# Patient Record
Sex: Female | Born: 1960 | Race: White | Marital: Married
Health system: Midwestern US, Academic
[De-identification: ages and names within clinical notes are randomized; demographics above are authoritative.]

## PROBLEM LIST (undated history)

## (undated) DIAGNOSIS — Z9989 Dependence on other enabling machines and devices: Secondary | ICD-10-CM

## (undated) DIAGNOSIS — Z8042 Family history of malignant neoplasm of prostate: Secondary | ICD-10-CM

## (undated) DIAGNOSIS — G4733 Obstructive sleep apnea (adult) (pediatric): Secondary | ICD-10-CM

## (undated) DIAGNOSIS — Z8041 Family history of malignant neoplasm of ovary: Secondary | ICD-10-CM

## (undated) DIAGNOSIS — C50919 Malignant neoplasm of unspecified site of unspecified female breast: Secondary | ICD-10-CM

## (undated) DIAGNOSIS — Z8049 Family history of malignant neoplasm of other genital organs: Secondary | ICD-10-CM

## (undated) DIAGNOSIS — I519 Heart disease, unspecified: Secondary | ICD-10-CM

## (undated) DIAGNOSIS — E785 Hyperlipidemia, unspecified: Secondary | ICD-10-CM

## (undated) DIAGNOSIS — E039 Hypothyroidism, unspecified: Secondary | ICD-10-CM

## (undated) DIAGNOSIS — I428 Other cardiomyopathies: Secondary | ICD-10-CM

## (undated) DIAGNOSIS — E119 Type 2 diabetes mellitus without complications: Secondary | ICD-10-CM

## (undated) DIAGNOSIS — Z800 Family history of malignant neoplasm of digestive organs: Secondary | ICD-10-CM

## (undated) DIAGNOSIS — T451X5A Adverse effect of antineoplastic and immunosuppressive drugs, initial encounter: Secondary | ICD-10-CM

## (undated) DIAGNOSIS — G620 Drug-induced polyneuropathy: Secondary | ICD-10-CM

## (undated) HISTORY — DX: Family history of malignant neoplasm of prostate: Z80.42

## (undated) HISTORY — DX: Heart disease, unspecified: I51.9

## (undated) HISTORY — DX: Family history of malignant neoplasm of ovary: Z80.41

## (undated) HISTORY — DX: Hyperlipidemia, unspecified: E78.5

## (undated) HISTORY — DX: Hypothyroidism, unspecified: E03.9

## (undated) HISTORY — DX: Obstructive sleep apnea (adult) (pediatric): Z99.89

## (undated) HISTORY — DX: Family history of malignant neoplasm of other genital organs: Z80.49

## (undated) HISTORY — DX: Obstructive sleep apnea (adult) (pediatric): G47.33

## (undated) HISTORY — DX: Other cardiomyopathies: I42.8

## (undated) HISTORY — DX: Type 2 diabetes mellitus without complications: E11.9

## (undated) HISTORY — DX: Morbid (severe) obesity due to excess calories: E66.01

## (undated) HISTORY — DX: Family history of malignant neoplasm of digestive organs: Z80.0

---
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
## 1979-01-14 NOTE — Unmapped (Signed)
Signed by Hughie Closs MD on 01/14/1979 at 00:00:00  Surgery Breast      Imported By: Deon Pilling 03/31/2007 10:36:31    _____________________________________________________________________    External Attachment:    Please see [facility] EMR for this document.

## 2005-05-08 HISTORY — PX: OOPHORECTOMY: SHX86

## 2005-06-19 NOTE — Unmapped (Signed)
Signed by Hughie Closs MD on 06/19/2005 at 00:00:00  Oncology      Imported By: Ileene Hutchinson 07/16/2006 15:46:34    _____________________________________________________________________    External Attachment:    Please see [facility] EMR for this document.

## 2005-10-08 DIAGNOSIS — Z923 Personal history of irradiation: Secondary | ICD-10-CM

## 2005-10-08 DIAGNOSIS — C50919 Malignant neoplasm of unspecified site of unspecified female breast: Secondary | ICD-10-CM

## 2005-10-08 HISTORY — DX: Malignant neoplasm of unspecified site of unspecified female breast: C50.919

## 2005-10-08 HISTORY — PX: BREAST LUMPECTOMY: SHX2

## 2005-10-08 HISTORY — DX: Personal history of irradiation: Z92.3

## 2005-10-08 HISTORY — PX: BREAST BIOPSY: SHX20

## 2006-05-10 NOTE — Unmapped (Signed)
Signed by Hughie Closs MD on 05/10/2006 at 00:00:00  Disclosure      Imported By: Elsworth Soho 05/21/2006 14:45:49    _____________________________________________________________________    External Attachment:    Please see [facility] EMR for this document.

## 2006-05-10 NOTE — Unmapped (Signed)
Signed by Hughie Closs MD on 05/10/2006 at 10:46:29      Reason for Visit   Chief Complaint: pre op,8-6-7,Dr.Rob Bradley,Lt breast lump removal,[facility].    History from: patient    Allergies  No Known Allergies    Medications     Vital Signs:   Ht: 65 in.  Wt: 223 lbs.      BMI: 37.24  BSA: 2.07    Temperature: 98.7  Pulse: 80  Resp: 12  BP: 126/90    Intake recorded by: Lanney Gins MA on May 10, 2006 10:02 AM  I have been asked to perform a pre-operative/pre-procedure consult of this patient by: rob bradley  Procedure: breast lump removal  Date of procedure: 05/13/2006      History of Present Illness: feels stressed -- not depressed -- is anxious about the surgery  can sleep  doesn't feel need for nerve pill  wants another opinion before having the lumpectomy -- can see Dr. Hassan Buckler on 05-16-06 (she has investigated this herself already)  no weight loss  some discomfort in the breast, dull  no skin changes noted    Past History  Past Medical History:  Hepatitis A  Surgical History:  2005 cystoscopy, Salpingo-Oophorectomy: 2006, 1990 laparoscopy  multiple d and c's, No problems with anesthesia    Family History: Father - htn  Social History: Marital Status: married,   Employment Status: employed full-time,   Occupation: Runner, broadcasting/film/video  Patient Lives at: home,   Support System: excellent  Caffeine per Day: <1  Seatbelt Use: 100 % of the time  Alcohol Use: none  Drug Use: none  Tobacco Usage:non-smoker        Physical Examination:     Additional Vitals:   BP: 126/  90    Physical Exam- Detail:   General Appearance: well-developed, well-nourished and in no acute distress.  Skin: Normal. No rashes or lesions.  Eyes: Sclera white, conjunctiva without injection and pallor.  PERRLA.  EOMI  Ears: No lesions.  Tympanic membranes translucent, non-bulging.  Canal walls pink, without discharge.  Hearing grossly intact.  Nose: Mucosa and turbinates pink, septum midline. No polyps, no discharge, no lesions.  Oropharynx:  Normal appearance.  No erythema,exudate, or mass. No tonsillar swelling.  Oral Cavity: Gums pink, good dentition.  Oral mucosa and tongue without lesions.  Respiratory: Respiration un-labored.  Lung fields clear to auscultation.  No wheezing, rales, rhonchi or pleural rub.  Neck: Full ROM.  No thyromegaly, no nodules, masses, or tenderness.   Lymphatic: Areas palpated not enlarged:  cervical, supraclavicular.  Cardiac: S1 and S2 normal.  RRR without murmurs, rubs, gallops.  No JVD.  Vascular: No carotid bruits.  No edema or varicosities.  Abdomen: No masses or tenderness. Bowel sounds active x4 quad.  Liver and spleen are without tenderness or enlargement.  No hernias.  Neurologic: Cranial nerves 2 through 12 intact.  Deep tendon reflexes 2+ bilaterally.  Sensation intact.  No spasticity.  Strength is 5/5 in upper and lower extremities bilaterally.  Psychiatric: slightly anxious but ow normal  Musculoskeletal: Gait coordinated and smooth.  Digits are without clubbing or cyanosis.        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  Runk per pt wishes - Hughie Closs MD  Assessed BREAST MASS as unchanged - not examined again today but discussed at lenth -- it does have to be addressed -- pt agrees - Hughie Closs MD  New Problems:  Dx of ANXIETY DISORDER, SITUATIONAL, MILD (ICD-309.24)   Dx of BREAST MASS (ICD-611.72)     Today's Orders   99214 - Ofc Vst, Est Level IV [[id number]]  Surgical Consult [[id number]]    Disposition:   prn                       ]

## 2006-05-10 NOTE — Unmapped (Signed)
Signed by Hughie Closs MD on 05/10/2006 at 00:00:00  Privacy Notice      Imported By: Elsworth Soho 05/21/2006 14:45:04    _____________________________________________________________________    External Attachment:    Please see [facility] EMR for this document.

## 2006-05-16 NOTE — Unmapped (Signed)
Signed by Morton Stall MD on 05/16/2006 at 00:00:00  Mammography      Imported By: Ileene Hutchinson 06/21/2006 13:50:34    _____________________________________________________________________    External Attachment:    Please see [facility] EMR for this document.

## 2006-05-23 NOTE — Unmapped (Signed)
Signed by Morton Stall MD on 05/23/2006 at 00:00:00  MRI Breast      Imported By: Ileene Hutchinson 07/01/2006 09:49:03    _____________________________________________________________________    External Attachment:    Please see [facility] EMR for this document.

## 2006-05-28 NOTE — Unmapped (Signed)
Signed by   [employer] on 05/28/2006 at 14:21:01  Patient: Ellen Berger  Note: All result statuses are Final unless otherwise noted.    Tests: (1) DIAG-CHEST PA & LATERAL [phone])    Order NotePricilla Handler Order Number: [id number]    Non-EMR Ordering Provider: Ardis Hughs     Order Note:     *** VERIFIED ***  [facility]  Reason:  left breast ca- stat wet reading please  Dict.Staff: Canary Brim [phone]    Verified By: Canary Brim        Ver: 05/28/06   2:21 pm  Exams:  DIAG-CHEST PA & LATERAL      CHEST (PA and lateral)        The heart is borderline/mildly enlarged.. Central vessels are  unremarkable. Lungs are clear as are the angles.  Surrounding  soft tissue and osseous structures are normal.    IMPRESSION:    No active chest disease.    Borderline heart size.  **** end of result ****    Note: An exclamation mark (!) indicates a result that was not dispersed into   the flowsheet.  Document Creation Date: 05/28/2006 2:21 PM  _______________________________________________________________________    (1) Order result status: Final  Collection or observation date-time: 05/28/2006 14:02:13  Requested date-time: 05/28/2006 13:42:00  Receipt date-time:   Reported date-time: 05/28/2006 14:21:00  Referring Physician: Chinita Pester  Ordering Physician:  Non-EMR Physician [facility])  Specimen Source:   Source: QRS  Filler Order Number: [id number]  Lab site: [facility]

## 2006-05-29 NOTE — Unmapped (Signed)
Signed by Hughie Closs MD on 05/29/2006 at 00:00:00  office notes from [facility]      Imported By: Maia Petties 08/21/2006 14:29:15    _____________________________________________________________________    External Attachment:    Please see [facility] EMR for this document.

## 2006-05-29 NOTE — Unmapped (Signed)
Signed by   [employer] on 06/06/2006 at 04:54:14  Patient: Ellen Berger  Note: All result statuses are Final unless otherwise noted.    Tests: (1)  (MR)    Order Note:                                        [facility]     PATIENT NAMEVENNELA, JUTTE                   MR #:  [id number]  DATE OF BIRTH:  10-08-1961                         ACCOUNT #:  [id number]  SURGEON:        Cherly Anderson. Hassan Buckler, M.D.               ROOM #:  SDS  SERVICE:        General Surgery                    NURSING UNIT:  [facility]  PRIMARY:        Morton Stall, M.D.              FCSalena Saner  REFERRING:      Caroline More, M.D.          ADMIT DATE:  05/29/2006  DICTATED BY:    Cherly Anderson. Hassan Buckler, M.D.               SURGERY DATE:  05/29/2006                                                     DISCHARGE DATE:                                    OPERATIVE REPORT     PREOPERATIVE DIAGNOSES:     1. Left breast mass.  2. Abnormal left mammogram.  3. Atypia on fine needle aspiration.     POSTOPERATIVE DIAGNOSIS:     1. Left breast cancer.     PROCEDURES PERFORMED:     1. Left lumpectomy.  2. Left axillary lymph node dissection with sentinel node biopsy.     SURGEON:  Jac Canavan, M.D.     ASSISTANT:  Fredric Dine, D.O.     ESTIMATED BLOOD LOSS:  Less than 50 mL.     ANESTHESIA:  General plus 30 mL of 1% Xylocaine.     SPECIMENS:     1. Left breast mass, frozen section infiltrating ductal carcinoma.  2. Superior margin.  3. Inferior margin.  4. Medial margin.  5. Posterolateral margin.  6. Sentinel lymph node number 1.  7. Sentinel lymph node number 2.  8. Left axillary contents.     TUBES AND DRAINS:  A 10-mm Blake drain x 1.     INDICATIONS FOR PROCEDURE:  The patient is a 45 year old female who noted a  mass in the left breast several months ago.  This mass has been increasing in  size.  She underwent a mammogram, which showed a questionable area  of  architectural distortion.  She was recently seen in the office and the mass  was palpated and felt to  be suspicious for carcinoma.  Ultrasound showed a 2  cm solid mass with posterior shadowing.  Fine needle aspiration showed  atypical cells suspicious for carcinoma.  We have discussed breast ________  and she will proceed with lumpectomy.  If this appears to be positive for  carcinoma, at the time of her frozen section we will also proceed with  axillary node dissection.     DETAILS OF PROCEDURE:  The patient was brought to the operating room and  placed on the OR table in the supine position.  Following the induction of  general anesthesia, 3 mL of methylene blue were injected at the 1 o'clock  position of the left breast along the areolar border.  The patient's left  breast was prepped with Betadine and draped in the usual sterile manner.  An  elliptical incision was made overlying the mass at the 2 o'clock position of  the left breast.  Dissection was carried down until the mass was identified.  The mass was grasped with Allis clamps and the lesion was removed.  The  specimen was marked with a short suture on the superior margin and a long  suture on the lateral margin.  The specimen was submitted to pathology and  frozen section revealed an infiltrating ductal carcinoma.  Additional tissue  was taken along the superior, inferior, medial and posterolateral margins.  The anterior margin was fully excised at the time of the initial biopsy.  Of  note, the patient had undergone a MRI of the breast prior to surgery and  there was only noted to be one lesion within the breast.  Xylocaine was  injected into the site.  Hemoclips were left on the margins of the lumpectomy  cavity.  The subcutaneous tissue was closed with interrupted 3-0 Vicryl  suture and the skin was closed with 4-0 Vicryl subcuticular stitch.     A curvilinear incision was made in the axilla and dissection was carried  through the subcutaneous tissue until the clavipectoral fascia was  identified.  Upon opening of the clavipectoral fascia, a small blue  stain  lymph node was noted.  This was submitted as sentinel lymph node number 1.  Further dissection did in fact reveal a 1.5 cm firm blue stained lymph node,  which was highly suspicious for metastatic disease.  For this reason, further  axillary nodes were removed from the level 1 region taking fat between the  axillary vein, the chest wall and the latissimus dorsi muscle.  Vascular and  lymphatic channels were clipped as they were encountered.  The long thoracic  and thoracodorsal nerves were identified and preserved.  A 10-mm Blake drain  was placed through a separate stab incision in the anterior axillary line and  anchored into place with a 2-0 silk suture.  The subcutaneous tissue was  closed with interrupted 3-0 Vicryl suture and the skin closed with 4-0 Vicryl  subcuticular stitch.  Benzoin, Steri-Strips, dry sterile gauze and Tegaderm  dressing were applied.  All needle and sponge counts were correct.     The patient was returned to recovery in good condition. I was present for the  entire procedure.  ________________________________________  DMR/km                                ____  D:  06/05/2006 20:54                  Dianne M. Hassan Buckler, M.D.  T:  06/06/2006 04:50  Job #:  [id number]  c:   Fredric Dine, D.O.                                    OPERATIVE REPORT                                        COPY                   PAGE    1 of 1    Note: An exclamation mark (!) indicates a result that was not dispersed into   the flowsheet.  Document Creation Date: 06/06/2006 4:54 AM  _______________________________________________________________________    (1) Order result status: Final  Collection or observation date-time: 05/29/2006 00:00  Requested date-time:   Receipt date-time:   Reported date-time:   Referring Physician: Jerrel Ivory  Ordering Physician:  Reviewed In Hospital [facility])  Specimen Source:   Source: DBS  Filler Order Number: [id number]  ASC  Lab site:

## 2006-07-04 NOTE — Unmapped (Signed)
Signed by Hughie Closs MD on 07/04/2006 at 00:00:00  Dr. Elesa Massed      Imported By: Ileene Hutchinson 07/19/2006 16:10:26    _____________________________________________________________________    External Attachment:    Please see [facility] EMR for this document.

## 2006-10-15 NOTE — Unmapped (Signed)
Signed by Hughie Closs MD on 10/15/2006 at 00:00:00  New Patient Consult---Dr. Delford Field      Imported By: Uvaldo Bristle 12/24/2006 09:23:35    _____________________________________________________________________    External Attachment:    Please see [facility] EMR for this document.

## 2006-10-30 NOTE — Unmapped (Signed)
Signed by Pandora Leiter MD on 10/30/2006 at 00:00:00  [facility]      Imported By: Burgess Estelle 12/19/2006 10:29:43    _____________________________________________________________________    External Attachment:    Please see [facility] EMR for this document.

## 2006-11-26 NOTE — Unmapped (Signed)
Signed by Hughie Closs MD on 11/26/2006 at 00:00:00  Oncology      Imported By: Deon Pilling 01/13/2007 08:39:35    _____________________________________________________________________    External Attachment:    Please see [facility] EMR for this document.

## 2006-12-10 NOTE — Unmapped (Signed)
Signed by Hughie Closs MD on 12/10/2006 at 00:00:00  Weekly radiation note      Imported By: Sena Hitch 04/16/2007 11:20:41    _____________________________________________________________________    External Attachment:    Please see [facility] EMR for this document.

## 2006-12-24 NOTE — Unmapped (Signed)
Signed by Hughie Closs MD on 12/24/2006 at 00:00:00  Radiation Summary      Imported By: Sena Hitch 01/22/2007 11:18:41    _____________________________________________________________________    External Attachment:    Please see [facility] EMR for this document.

## 2007-01-21 NOTE — Unmapped (Signed)
Signed by Hughie Closs MD on 01/21/2007 at 00:00:00  Radiation follow-up note      Imported By: Sena Hitch 04/16/2007 11:20:16    _____________________________________________________________________    External Attachment:    Please see [facility] EMR for this document.

## 2008-12-03 NOTE — Unmapped (Signed)
Signed by Verdie Shire MD on 12/03/2008 at 20:19:54      Reason for Visit   Chief Complaint: cough, has had for 3 weeks.    History from: patient    Allergies  No Known Allergies    Medications   TAMOXIFEN CITRATE 20 MG TABS (TAMOXIFEN CITRATE) 1 by mouth daily       Vital Signs:   Ht: 65 in.  Wt: 230 lbs.      BMI: 38.41  BSA: 2.10  Wt chg (lbs): 7  Temperature: 98.5  degrees F  oral  Pulse: 92 (regular)  BP: 126/84  Cuff size: large    Intake recorded by: Paschal Dopp MA on December 03, 2008 3:30 PM    History of Present Illness   Ellen Berger went to urgent care 2 weeks ago.     Ellen Berger was given Biaxin.     Ellen Berger had a chest Xray showing clear lungs and mild cardiomegaly.  yesterday Ellen Berger could not stop coughing.      Ellen Berger does not feel completely well.   Ellen Berger is sometimes short of breath.    Ellen Berger feels her sinuses are congested.    Past History  Past Medical History:  Hepatitis A, Primary Breast cancer  Surgical History:  2005 cystoscopy, Salpingo-Oophorectomy: 2006 - dermoid cyst, 1990 laparoscopy  multiple d and c's, No problems with anesthesia    Family History: Father - htn  PGM - uterine cancer  Social History (reviewed - no changes required): Marital Status: married,   Employment Status: employed full-time,   Occupation: Runner, broadcasting/film/video  Patient Lives at: home,   Support System: excellent  Caffeine per Day: <1  Seatbelt Use: 100 % of the time  Alcohol Use: none  Drug Use: none  Tobacco Usage:non-smoker        Physical Examination:   BP: 126/  84    Physical Exam- Detail:   General Appearance: well-developed, well-nourished and in no acute distress.  Eyes: Sclera white, conjunctiva without injection and pallor.  PERRLA.  EOMI  Ears: No lesions.  Tympanic membranes translucent, non-bulging.  Canal walls pink, without discharge.  Hearing grossly intact.  Nose/Face: Mucosa and turbinates pink, septum midline. No polyps, no discharge, no lesions.  Oropharynx: Normal appearance.  No erythema, exudate or mass. No tonsillar  swelling.  Oral Cavity: Gums pink, good dentition.  Oral mucosa and tongue without lesions.  Respiratory: Respiration un-labored.  Lung fields clear to auscultation.  No wheezing, rales, rhonchi or pleural rub.  Neck: No thyromegaly.  No nodules, masses or tenderness.  Lymphatic: Areas palpated not enlarged:  cervical, supraclavicular.  Cardiac: S1 and S2 normal.  RRR without murmurs, rubs, gallops.  No JVD.           New Problems:  ACUTE BRONCHITIS (ICD-466.0)  New Medications:  TAMOXIFEN CITRATE 20 MG TABS (TAMOXIFEN CITRATE) 1 by mouth daily  GUAIFENESIN AC 100-10 MG/5ML SYRP (GUAIFENESIN-CODEINE) 5 - 10 cc every 4 hours as needed      Preventive Maintenance             Prescriptions:  GUAIFENESIN AC 100-10 MG/5ML SYRP (GUAIFENESIN-CODEINE) 5 - 10 cc every 4 hours as needed  #240 cc x 0   Entered and Authorized by: Verdie Shire MD   Signed by: Verdie Shire MD on 12/03/2008   Method used: Print then Give to Patient   RxID: [id number]      Assessment and Plan   Ellen Berger will ask Dr. Elesa Massed  about the cardilomegaly.   I will give her an order for a follow up chest Xray.    Problems   Status of Existing Problems:  Assessed ACUTE BRONCHITIS as improved - Ellen Berger is better but not well.    I will give her cough syrup.     Ellen Berger will let me know if her cough becomes more productive. Verdie Shire MD - Signed  New Problems:  Dx of ACUTE BRONCHITIS (ICD-466.0)  Onset: 12/03/2008    Medications   New Prescriptions/Refills:  GUAIFENESIN AC 100-10 MG/5ML SYRP (GUAIFENESIN-CODEINE) 5 - 10 cc every 4 hours as needed  #240 cc x 0, 12/03/2008, Verdie Shire MD    Today's Orders   [phone] - Ofc Vst, Est Level III [CPT-99213]  X-ray, Chest, PA & Lateral [CPT-71020]

## 2008-12-08 NOTE — Unmapped (Signed)
Signed by Verdie Shire MD on 12/08/2008 at 00:00:00  Cardiac       Imported By: Doyle Askew 01/19/2009 11:32:32    _____________________________________________________________________    External Attachment:    Please see [facility] EMR for this document.

## 2009-01-01 NOTE — Unmapped (Signed)
Signed by Verdie Shire MD on 01/01/2009 at 18:30:41    PHONE NOTE    Reason for Call: acute illness. she has had vomiting and diarrhea since yesterday.  she was put on new medication for a cardiomyopathy but was told to call me about the vomiting.    she is taking coreg, Lasix, and potassium.  I recommended she go to the ER at [facility] where her cardiologist works to have her lab work checked.           Initial call taken by: Verdie Shire MD,  January 01, 2009 6:29 PM

## 2009-01-01 NOTE — Unmapped (Signed)
Signed by   LinkLogic on 01/01/2009 at 21:39:45  Patient: Ellen Berger  Note: All result statuses are Final unless otherwise noted.    Tests: (1)  (Emergency Room Report)    Order Note:     ADDENDUM:    Her CT of the abdomen was consistent with right pyelonephritis.  I discussed   the findings  with the patient and I will also discuss them with Dr. Yvonna Alanis group for   probable  admission.          Order Note: Site: [facility]  [phone]  Admission Date/Time: 01/01/09   Discharge Date/Time: //   Unit #: [id number]  Location: BN6T2  Account #: [id number]  Primary Insurance: [employer]  Admitting Diagnosis: ABDOMINAL PAIN, NAUSEA, VOMITING  Report #: [phone]      Order Note:     Order Note: _________________________________  Signed by:    Tarri Glenn MD      PG  D:  01/01/09 1738  T:  01/01/09 1921    This document is confidential medical information.  Unauthorized disclosure or   use of this information is prohibited by law.  If you are not the intended   recipient of this document, please advise [place] by calling immediately   [phone].    Note: An exclamation mark (!) indicates a result that was not dispersed into   the flowsheet.  Document Creation Date: 01/01/2009 9:39 PM  _______________________________________________________________________    (1) Order result status: Final  Collection or observation date-time: 01/01/2009 17:38  Requested date-time: 01/01/2009 19:21  Receipt date-time:   Reported date-time:   Referring Physician:    Ordering Physician:  Reviewed In Hospital [facility])  Specimen Source:   Source: Francesco Sor Order Number: [id number] TRANSCRIPTION  Lab site: [place] [facility]

## 2009-01-01 NOTE — Unmapped (Signed)
Signed by   LinkLogic on 01/04/2009 at 08:54:57  Patient: Ellen Berger  Note: All result statuses are Final unless otherwise noted.    Tests: (1)  (Emergency Room Report)    Order Note:       CHIEF COMPLAINT:  Nausea and vomiting.    HISTORY OF PRESENT ILLNESS:  The patient is a 48 year old female, who reports   that she has had nausea and vomiting since 2 o'clock yesterday.  She reports   her vomitus has been yellowish and greenish, discolored occasionally.  Denies   any blood in the vomitus.  Reports that she has some occasional left upper   abdominal discomfort, worse with vomiting.  Describes it as sharp crampy in   nature.  Does report there is some radiation of the discomfort to her back.    In any case, she had 1 episode of loose stool yesterday, but she reports she   has had no overt diarrhea.  Denies noticing any blood in the vomitus or the   stool.  Denies any fever at home.  Reports she is not aware of any   exacerbating or alleviating factors of the nausea or vomiting.   ALLERGIES:  None.    CURRENT MEDICATIONS:  1.  Tamoxifen.  2.  Potassium chloride.  3.  Lasix.  4.  _____.    PAST MEDICAL HISTORY:  Breast cancer as well as recent diagnosis of CHF.    PAST SURGICAL HISTORY:  The patient recently had a heart catheterization.    SOCIAL HISTORY:  No alcohol.  No tobacco.  No illicit drug use.  The patient   lives independently at home with her husband.    REVIEW OF SYSTEMS:  Significant for nausea, vomiting, some upper abdominal   discomfort since yesterday.  The patient denies any fevers at home.  Reports   she has no chest pain or shortness of breath.  Indicates she has no headache   or dizziness.  Did have one episode of diarrhea yesterday but indicates the   diarrhea has resoled.  Reports she has seen no blood in the vomitus or the   stool. Indicates she has no lower back discomfort or dysuria.  Remainder of   review of systems is otherwise negative.    PHYSICAL EXAMINATION:  GENERAL:  Pleasant  48 year old female, in no acute distress upon evaluation.  VITAL SIGNS:  Blood pressure 113/88, pulse 118, respiratory rate 20, O2   saturation 97%, and temperature 99.0.  HEENT:  Head is normocephalic and atraumatic.  Pupils are equal, round, and   reactive to light and accommodation.  Extraocular muscles are intact.  Sclera   nonicteric.  Nares are patent bilaterally.  Oral mucous membranes are pink and   moist.  No lesions.  NECK:  Supple.  Trachea is midline.  LUNGS:  Clear bilaterally.  No adventitious sounds.  CARDIOVASCULAR:  Slightly tachycardic with no rubs or gallops appreciated.  ABDOMEN:  Soft.  There is mild epigastric tenderness to palpation with no   guarding, no rebound.  Good bowel sounds in all 4 quadrants.  No masses are   noted. BACK:  Shows no CVA tenderness to palpation.  PELVIC:  Stable.  EXTREMITIES:  She has full range of motion in both upper and lower extremities   and 5/5 strength.  Good distal pulses, good distal sensation in all 4   extremities.  No signs of edema or tenderness to palpation lower extremities   bilaterally.  SKIN:  Exam shows no rashes, lesions, or petechiae.  NEUROLOGICAL:  Cranial nerves II through XII grossly intact.    LABS AND DATA:  CBC reveals white count of 17, H&H 15 and 46, and platelet   count 348,000.  Lipase 21, amylase 38.  LFTs seen and evaluated by myself, all   within normal limits except the AST elevated at 100 and the ALT at 82.    Metabolic panel showed sodium 135, potassium 3.7, chloride 98, CO2 27, BUN and   creatinine 14 and 0.76, glucose 139.  Urinalysis shows trace leukocyte   esterase, negative nitrites.  There are 13 wbc's with no obvious bacteria   noted.  Acute abdominal series showed nonspecific bowel gas pattern with no   obvious air-fluid levels, no free air appreciated under the diaphragm.    MEDICAL DECISION MAKING:  A 48 year old female with history of breast cancer   presents with nausea and vomiting for a day.  Abdomen is soft here in  the   emergency department.  She has no significant right upper quadrant tenderness   to palpation or right lower quadrant tenderness to palpation.  I am not   concerned at present for cholecystitis or appendicitis.  She does have some   upper epigastric and left-sided pain, but she has normal lipase, and,   therefore, the pancreatitis is not a concern.  Given the fact that her white   count was elevated the CT scan has been ordered.  The patient has currently   finished her contrast and is pending CT of the abdomen and pelvis to rule out   any other intraabdominal etiology contributing to her symptoms.  She is in   stable condition at present.  The vomiting has been controlled with 1 dose of   Zofran.  Further dictation to provide ultimate disposition and CT scan   results.                  Order Note: Site: [facility]  [phone]  Admission Date/Time: 01/01/09   Discharge Date/Time: 01/02/09   Unit #: [id number]  Location: BN6T2  Account #: [id number]  Primary Insurance: [employer]  Admitting Diagnosis: ABDOMINAL PAIN, NAUSEA, VOMITING  Report #: [id number]      Order Note:     Order Note: _________________________________  Signed by:    Chaney Born MD      RN  D:  01/01/09   T:  01/04/09 0834    This document is confidential medical information.  Unauthorized disclosure or   use of this information is prohibited by law.  If you are not the intended   recipient of this document, please advise [place] by calling immediately   [phone].    Note: An exclamation mark (!) indicates a result that was not dispersed into   the flowsheet.  Document Creation Date: 01/04/2009 8:54 AM  _______________________________________________________________________    (1) Order result status: Final  Collection or observation date-time: 01/01/2009  Requested date-time: 01/04/2009 08:34  Receipt date-time:   Reported date-time:   Referring Physician:    Ordering Physician:  Reviewed  In Hospital [facility])  Specimen Source:   Source: Francesco Sor Order Number: [id number] TRANSCRIPTION  Lab site: [facility]

## 2009-01-02 NOTE — Unmapped (Signed)
Signed by   LinkLogic on 01/02/2009 at 13:17:01  Patient: Ellen Berger  Note: All result statuses are Final unless otherwise noted.    Tests: (1)  (D/C Medication Reconciliation)    Order Note:     NOLVADEX                           Take 20 MG  (TAMOXIFEN CITRATE)               EVERY DAY By Mouth    KLOR-CON                           Take 20 MEQ  (POTASSIUM CHLORIDE)              EVERY DAY By Mouth    LASIX                              Take 40 MG  (FUROSEMIDE)                      TWO TIMES A DAY By Mouth    COREG                              Take 3.125 MG  (CARVEDILOL)                      TWO TIMES A DAY By Mouth    OS-CAL-D 500                       Take 2 EACH  (CALC CARB 1250/VIT D 200 U)      EVERY DAY By Mouth    NOLVADEX                           Take 20 MG  (TAMOXIFEN CITRATE)               2100 By Mouth      New Orders:  LEVAQUIN 500MG  DAILY FOR 7 DAYS    A copy of this report was provided to patient: [name]            Order Note: Site: [facility]  [phone]  Admission Date/Time: 01/01/09   Discharge Date/Time: //   Unit #: [id number]  Location: BN6T2  Account #: [id number]  Primary Insurance: [employer]  Admitting Diagnosis: ABDOMINAL PAIN, NAUSEA, VOMITING  Report #: [id number]      Order Note:     Order Note: _________________________________  Signed by:            DC  D:  01/02/09 1222  T:  01/02/09 1222    This document is confidential medical information.  Unauthorized disclosure or   use of this information is prohibited by law.  If you are not the intended   recipient of this document, please advise [place] by calling immediately   [phone].    Note: An exclamation mark (!) indicates a result that was not dispersed into   the flowsheet.  Document Creation Date: 01/02/2009 1:17 PM  _______________________________________________________________________    (1) Order result status: Final  Collection or observation date-time: 01/02/2009  12:22  Requested date-time: 01/02/2009 12:22  Receipt date-time:  Reported date-time:   Referring Physician:    Ordering Physician:  Reviewed In Hospital [facility])  Specimen Source:   Source: Francesco Sor Order Number: [id number] TRANSCRIPTION  Lab site: [facility]

## 2009-01-02 NOTE — Unmapped (Signed)
Signed by   LinkLogic on 01/03/2009 at 04:07:11  Patient: Barabara Wonnacott  Note: All result statuses are Final unless otherwise noted.    Tests: (1)  (DS)    Order Note:   For your records, the bill status(es) for [facility] during their   hospital stay is as follows:    01/01/09 1222  ENTER REG ER  01/01/09 1831  ENTER ADM IN  01/01/09 1831  CHANGE IN TO OBSERV  01/02/09 1619  DISCHARGE OBSERV    Any questions should be directed to the Care Management Departments:  [facility] [phone] or [facility] [phone].          Order Note: Site: [facility]  [phone]  Admission Date/Time: 01/01/09   Discharge Date/Time: 01/02/09   Unit #: [id number]  Location: BN6T2  Account #: [id number]  Primary Insurance: [employer]  Admitting Diagnosis: ABDOMINAL PAIN, NAUSEA, VOMITING  Report #: [id number]      Order Note:     Order Note: _________________________________  Signed byBonnetta Barry  D:  01/02/09 1619  T:  01/02/09 1619    This document is confidential medical information.  Unauthorized disclosure or   use of this information is prohibited by law.  If you are not the intended   recipient of this document, please advise [place] by calling immediately   [phone].    Note: An exclamation mark (!) indicates a result that was not dispersed into   the flowsheet.  Document Creation Date: 01/03/2009 4:07 AM  _______________________________________________________________________    (1) Order result status: Final  Collection or observation date-time: 01/02/2009 16:19  Requested date-time: 01/02/2009 16:19  Receipt date-time:   Reported date-time:   Referring Physician:    Ordering Physician:  Reviewed In Hospital [facility])  Specimen Source:   Source: Francesco Sor Order Number: [id number] TRANSCRIPTION  Lab site: [facility]

## 2009-01-04 LAB — OFFICE VISIT LAB RESULTS
Glucose, UA: NEGATIVE
Ketones, UA: NEGATIVE
Leukocyte Esterase, UA: NEGATIVE
Nitrite, UA: NEGATIVE
Specific Gravity, UA: 1.005
Urobilinogen, UA: 2
pH, UA: 6

## 2009-01-04 NOTE — Unmapped (Signed)
Signed by Verdie Shire MD on 01/04/2009 at 14:34:38      Reason for Visit   Chief Complaint: follow up on kidney   infection  follow up from hospital    History from: patient    Allergies  No Known Allergies    Medications   TAMOXIFEN CITRATE 20 MG TABS (TAMOXIFEN CITRATE) 1 by mouth daily  GUAIFENESIN AC 100-10 MG/5ML SYRP (GUAIFENESIN-CODEINE) 5 - 10 cc every 4 hours as needed  LEVAQUIN 500-5 MG/100ML-% SOLN (LEVOFLOXACIN IN D5W) one tablet once   daily  COREG 3.125 MG TABS (CARVEDILOL) one tablet twice  a  day  FUROSEMIDE 40 MG TABS (FUROSEMIDE) one tablet twice  aday  KLOR-CON M20 20 MEQ CR-TABS (POTASSIUM CHLORIDE CRYS CR) one tablet twice aday       Vital Signs:   Wt: 206 lbs.      BMI: 34.40  BSA: 2.00  Wt chg (lbs): -24  Pulse: 82 (regular)  BP: 110/82  Cuff size: large    Intake recorded by: Tora Kindred MA on January 04, 2009 11:04 AM    History of Present Illness   she was diagnosed with a cardiomyopathy since I saw her last.    her oncologist referred her to a cardiologist after being told about the chest Xray.  she saw Dr. Thayer Jew who did a heart cath last Thursday after getting an echo.     he wanted to rule out CAD and valvular disease.  he put her on coreg and furosemide.     she developed nausea and vomiting and went to the ER on Saturday.    she was diagnosed with pyelonephritis.     she feels better with Levaquin.     she stopped working out in November.    she was tired.               Physical Examination:   BP: 110/  82    Physical Exam- Detail:   General Appearance: well-developed, well-nourished and in no acute distress.  Respiratory: Respiration un-labored.  Lung fields clear to auscultation.  No wheezing, rales, rhonchi or pleural rub.  Cardiac: S1 and S2 normal.  RRR without murmurs, rubs, gallops.  No JVD.  Vascular: No carotid bruits.  No edema or varicosities.  Abdomen: No masses or tenderness. Bowel sounds active x4 quad.  Liver and spleen are without tenderness or enlargement.  No  hernias.      In office Procedures & Tests   Date/Time Collected: 01-04-2009    Routine Urinalysis     Physical characteristics   Color: yellow  Appearance: clear    Chemical measurements   Glucose (mg/dL): negative  Bilirubin: +  Ketone (mg/dL): negative  Spec. Gravity: 1.005  Blood: +++/large  pH: 6  Protein (mg/dL): trace  Urobilinogen (mg/dL): 2  Nitrite: negative  Leukocytes: negative         New Problems:  UTI (ICD-599.0)  CARDIOMYOPATHY (ICD-425.4)  New Medications:  LEVAQUIN 500-5 MG/100ML-% SOLN (LEVOFLOXACIN IN D5W) one tablet once   daily  COREG 3.125 MG TABS (CARVEDILOL) one tablet twice  a  day  FUROSEMIDE 40 MG TABS (FUROSEMIDE) one tablet twice  aday  KLOR-CON M20 20 MEQ CR-TABS (POTASSIUM CHLORIDE CRYS CR) one tablet twice aday      Preventive Maintenance               Assessment and Plan   I will see her next week to recheck her urine.  it is not clear if her heart disease is from the chemotherapy.    Dr. Thayer Jew told her she had equal chances of getting better, worse and staying the same.    Problems   Status of Existing Problems:  Assessed CARDIOMYOPATHY as new - she is newly on medication. - Verdie Shire MD - Signed  Assessed UTI as new - she will finish levaquin.     she still has blood in her urine. - Verdie Shire MD - Signed  New Problems:  Dx of UTI (ICD-599.0)  Onset: 01/01/2009  Dx of CARDIOMYOPATHY (ICD-425.4)  Onset: 01/04/2009    Medications   Today's Orders   99213 - Ofc Vst, Est Level III [UXL-24401]  UA Dipstick (Office) [CPT-81002]    Disposition:   Return to clinic for Doctor Visit in 1 week(s)

## 2009-01-12 LAB — OFFICE VISIT LAB RESULTS
Bilirubin Urine: NEGATIVE
Glucose, UA: NEGATIVE
Ketones, UA: NEGATIVE
Nitrite, UA: NEGATIVE
Protein, UA: NEGATIVE
Specific Gravity, UA: 1.01
Urobilinogen, UA: 2
pH, UA: 5.5

## 2009-01-12 NOTE — Unmapped (Signed)
Signed by Verdie Shire MD on 01/13/2009 at 19:23:54      Reason for Visit   Chief Complaint: f/u UTI, took last abx on sunday.    History from: patient    Allergies  No Known Allergies    Medications   TAMOXIFEN CITRATE 20 MG TABS (TAMOXIFEN CITRATE) 1 by mouth daily  GUAIFENESIN AC 100-10 MG/5ML SYRP (GUAIFENESIN-CODEINE) 5 - 10 cc every 4 hours as needed  LEVAQUIN 500-5 MG/100ML-% SOLN (LEVOFLOXACIN IN D5W) one tablet once   daily  COREG 3.125 MG TABS (CARVEDILOL) one tablet twice  a  day  FUROSEMIDE 40 MG TABS (FUROSEMIDE) one tablet twice  aday  KLOR-CON M20 20 MEQ CR-TABS (POTASSIUM CHLORIDE CRYS CR) one tablet twice aday       Vital Signs:   Ht: 65 in.  Wt: 219 lbs.      BMI: 36.58  BSA: 2.06  Wt chg (lbs): 13  Pulse: 80 (regular)  BP: 120/82  Cuff size: large    Intake recorded by: Paschal Dopp MA on January 12, 2009 5:07 PM    History of Present Illness   she is constipated and not better with metamucil.     she thinks it is due to Lasix.      she had a bowel movement today.      her stool is hard.    she will try a stool softener.    she had a work up for hematuria in the past.    it was negative.              Physical Examination:   BP: 120/  82    Additional Vitals:   BP: 116 / 84    Physical Exam- Detail:   General Appearance: well-developed, well-nourished and in no acute distress.  Respiratory: Respiration un-labored.  Lung fields clear to auscultation.  No wheezing, rales, rhonchi or pleural rub.  Cardiac: S1 and S2 normal.  RRR without murmurs, rubs, gallops.  No JVD.  Vascular: No carotid bruits.  No edema or varicosities.  Abdomen: No masses or tenderness. Bowel sounds active x4 quad.  Liver and spleen are without tenderness or enlargement.  No hernias.      In office Procedures & Tests   Date/Time Collected: 4/7 5:15    Routine Urinalysis     Physical characteristics   Color: yellow  Appearance: clear    Chemical measurements   Glucose (mg/dL): negative  Bilirubin: negative  Ketone (mg/dL):  negative  Spec. Gravity: 1.010  Blood: non-hemolyzed trace  pH: 5.5  Protein (mg/dL): negative  Urobilinogen (mg/dL): 2  Nitrite: negative  Leukocytes: trace         New Problems:  ABNORMAL WEIGHT GAIN (ICD-783.1)      Preventive Maintenance               Assessment and Plan     Problems   Status of Existing Problems:  Assessed UTI as improved - I will send a culture.    she has no symptoms.     - Verdie Shire MD - Signed  Assessed ABNORMAL WEIGHT GAIN as new - she does not have signs of fluid overload.     she did not gain weight on her home scale.    Verdie Shire MD - Signed  New Problems:  Dx of ABNORMAL WEIGHT GAIN (ICD-783.1)  Onset: 01/12/2009  Today's Orders   Urine Culture & Sensitivity [CPT-87086]  30160 -  Ofc Vst, Est Level III [[id number]]  UA Dipstick (Office) [CPT-81002]

## 2009-01-12 NOTE — Unmapped (Signed)
Signed by Verdie Shire MD on 01/14/2009 at 18:40:19  Patient: Ellen Berger  Note: All result statuses are Final unless otherwise noted.    Tests: (1) CULTURE, URINE, ROUTINE (QDL-395)  ! CULTURE              [A]  .              CULTURE, URINE, ROUTINE                 MICRO NUMBER:      [id number]        TEST STATUS:       FINAL        SPECIMEN SOURCE:   URINE        SPECIMEN QUALITY: ADEQUATE        RESULT:            50,000-100,000 CFU/ML OF GROUP B STREPTOCOCCUS   ISOLATED                                     BETA-HEMOLYTIC STREPTOCOCCI ARE                                     PREDICTABLY SUSCEPTIBLE TO PENICILLIN                                     AND OTHER BETA-LACTAMS.                                     SUSCEPTIBILITY TESTING NOT ROUTINELY                                     PERFORMED.              COMMENT:           ADDITIONAL ORGANISM(S) LESS THAN 10,000 CFU/ML                           ISOLATED. THESE ORGANISMS, COMMONLY FOUND ON                           EXTERNAL AND INTERNAL GENITALIA, ARE CONSIDERED                           COLONIZERS. NO FURTHER TESTING PERFORMED.    Note: An exclamation mark (!) indicates a result that was not dispersed into   the flowsheet.  Document Creation Date: 01/13/2009 11:16 PM  _______________________________________________________________________    (1) Order result status: Final  Collection or observation date-time: 01/12/2009  Requested date-time:   Receipt date-time: 01/12/2009 23:01  Reported date-time: 01/13/2009 23:00  Referring Physician:    Ordering Physician: Vance Peper [facility])  Specimen Source: R  Source: Arline Asp Order Number: [id number]  Lab site: [facility] [place]      [address]      [place]  [phone]

## 2009-01-14 NOTE — Unmapped (Signed)
Signed by Paschal Dopp MA on 01/19/2009 at 08:57:20      Urine Culture:   Urine culture test results show: group B strep       Follow-up for Test Results:   Comments: she should take a different antibiotic to treat this infection.  Action taken: call patient to inform of results  Follow-up by: Verdie Shire MD,  January 14, 2009 6:42 PM    Additional Follow-up for Test Results:   Action Taken: Left patient message on answering machine  Additional Follow-up by: Paschal Dopp MA,  January 17, 2009 10:28 AM    Additional Follow-up for Test Results:   Comments: results given to husband, rx called in.  Additional Follow-up by: Paschal Dopp MA,  January 19, 2009 8:56 AM    New Medications:  AMOXICILLIN 875 MG TABS (AMOXICILLIN) 1 twice a day    Prescriptions:  AMOXICILLIN 875 MG TABS (AMOXICILLIN) 1 twice a day  #20 x 0   Entered and Authorized by: Verdie Shire MD   Signed by: Verdie Shire MD on 01/14/2009   Method used: Telephoned to ...        RxID: [id number]

## 2009-03-16 LAB — POCT URINALYSIS DIPSTICK
Bilirubin, UA: NEGATIVE
Glucose, UA POC: NEGATIVE
Ketones, UA: NEGATIVE
Leukocytes, UA: NEGATIVE
Nitrite, UA: NEGATIVE
Protein, UA POC: NEGATIVE
Spec Grav, UA: 1.025
Urobilinogen, UA: 0.2
pH, UA: 5

## 2009-03-16 NOTE — Progress Notes (Signed)
Subjective:      Patient ID: Jillian Warren is a 48 y.o. female.    HPI  She is here for her scheduled visit.    Her BP is running lower at home.   She had a cough with ramipril and was not started on another medication.    She is scheduled to go to [facility] at the end of June to see Dr. Gay Filler for a second opinion.    She has been seeing Dr. Thayer Jew.     She sleeps on 4 pillows to avoid getting a cough.   Her weight has been stable.  She always has blood in her urine.    She saw a urologist for this in the 2005.   She has not had a period for a year.    She sees the gynecologist next month.  Review of Systems   Respiratory: Positive for shortness of breath.    Gastrointestinal: Positive for nausea.   Neurological: Positive for dizziness.       Objective:   Physical Exam   Constitutional: She is oriented to person, place, and time. She appears well-developed and well-nourished.   HENT:   Right Ear: Tympanic membrane, external ear and ear canal normal.   Left Ear: Tympanic membrane, external ear and ear canal normal.   Nose: Nose normal.   Mouth/Throat: Oropharynx is clear and moist.   Eyes: Conjunctivae are normal. Pupils are equal, round, and reactive to light.   Neck: Neck supple. No JVD present.   Cardiovascular: Normal rate, regular rhythm, normal heart sounds and intact distal pulses.  Exam reveals no friction rub.    No murmur heard.  Pulmonary/Chest: Effort normal and breath sounds normal. No respiratory distress. She has no wheezes. She has no rales.   Abdominal: Soft. She exhibits no distension. No tenderness.   Musculoskeletal: Normal range of motion. She exhibits no edema and no tenderness.   Lymphadenopathy:     She has no cervical adenopathy.   Neurological: She is alert and oriented to person, place, and time.   Psychiatric: She has a normal mood and affect.   Blood pressure 112/76, pulse 76, height 5\' 5"  (1.651 m), weight 214 lb (97.07 kg).        ASSESSMENT:  Cardiomyopathy stable.   She  is going to [facility] for a second opinion.   microscopic hematuria -- she had a negative work up in the past  Breast cancer -- no evidence of recurrence.    She is supposed to stay on tamoxifen for 2 more years.      Plan:    check labs today.   I will send her a letter with the results.

## 2009-03-21 ENCOUNTER — Encounter

## 2009-03-21 DIAGNOSIS — E039 Hypothyroidism, unspecified: Secondary | ICD-10-CM | POA: Insufficient documentation

## 2009-03-21 LAB — CBC WITH AUTO DIFFERENTIAL
Basophils %: 0.2 %
Basophils Absolute: 14 {cells}/uL (ref 0–200)
Eosinophils %: 0.7 %
Eosinophils Absolute: 50 {cells}/uL (ref 15–500)
Hematocrit: 41.5 % (ref 35.0–45.0)
Hemoglobin: 13.4 g/dL (ref 11.7–15.5)
Lymphocytes %: 30.5 %
Lymphocytes Absolute: 2166 {cells}/uL (ref 850–3900)
MCH: 30.5 pg (ref 27.0–33.0)
MCHC: 32.4 g/dL (ref 32.0–36.0)
MCV: 94.3 fL (ref 80.0–100.0)
Monocytes %: 7 %
Monocytes Absolute: 497 {cells}/uL (ref 200–950)
Neutrophils Absolute: 4374 {cells}/uL (ref 1500–7800)
Platelets: 281 10*3/uL (ref 140–400)
RBC: 4.4 10*6/uL (ref 3.80–5.10)
RDW: 16.5 % — ABNORMAL HIGH (ref 11.0–15.0)
Segs Relative: 61.6 %
White Blood Cells: 7.1 10*3/uL (ref 3.8–10.8)

## 2009-03-21 LAB — COMPREHENSIVE METABOLIC PANEL
ALT: 17 U/L (ref 6–40)
AST: 16 U/L (ref 10–35)
Albumin/Globulin Ratio: 1.3 (calc) (ref 1.0–2.1)
Albumin: 4 g/dL (ref 3.6–5.1)
Alkaline Phosphatase: 47 U/L (ref 33–115)
BUN: 21 mg/dL (ref 7–25)
CO2: 22 mmol/L (ref 21–33)
Calcium: 9.4 mg/dL (ref 8.6–10.2)
Chloride: 106 mmol/L (ref 98–110)
Creatinine: 0.9 mg/dL (ref 0.59–1.07)
Est, Glom Filt Rate: >60 mL/min/{1.73_m2} (ref >=60)
GFR Est, African/Amer: >60 mL/min/{1.73_m2} (ref >=60)
Globulin: 3.2 g/dL (ref 2.2–3.9)
Glucose: 107 mg/dL — ABNORMAL HIGH (ref 65–99)
Potassium: 4.4 mmol/L (ref 3.5–5.3)
Sodium: 139 mmol/L (ref 135–146)
Total Bilirubin: 0.6 mg/dL (ref 0.2–1.2)
Total Protein: 7.2 g/dL (ref 6.2–8.3)

## 2009-03-21 LAB — TSH: TSH: 7.94 mIU/L — ABNORMAL HIGH

## 2009-03-21 LAB — LIPID PANEL
Chol/HDL Ratio: 6.3 (calc) — ABNORMAL HIGH (ref <=5.0)
Cholesterol, Total: 202 mg/dL — ABNORMAL HIGH (ref 125–200)
HDL: 32 mg/dL — ABNORMAL LOW (ref >=46)
LDL Calculated: 109 mg/dL (calc) (ref <130)
Triglycerides: 303 mg/dL — ABNORMAL HIGH (ref <150)

## 2009-03-21 LAB — T4, FREE: T4 Free: 1.1 ng/dL (ref 0.8–1.8)

## 2009-06-08 NOTE — Telephone Encounter (Signed)
Has been seen in epic

## 2009-06-08 NOTE — Telephone Encounter (Signed)
She needs her thyroid checked on this dose.

## 2009-06-09 NOTE — Telephone Encounter (Signed)
Lm for pt to have labs drawn.

## 2009-07-01 NOTE — Progress Notes (Signed)
Subjective:      Patient ID: Jillian Warren is a 48 y.o. female.    HPI  She is on Diovan from [facility].   She takes her lasix prn and has not needed it.  She had a sleep study that confirms sleep apnea.    She has not yet started on CPAP.  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     She needs her thyroid rechecked.   She also had a high blood sugar of 168 on 06/08/09.    She says that I am supposed to watch this.    She wants a flu shot as soon as possible.  She pulled a muscle in her back.    She will try heat and/or see a chiropracter.         Dr. Dory Peru wanted to delay putting in the defibrillator.       Review of Systems   Musculoskeletal: Positive for back pain.   Psychiatric/Behavioral: Positive for sleep disturbance. Negative for dysphoric mood.       Objective:   Physical Exam   Constitutional: She appears well-developed and well-nourished. No distress.   HENT:   Nose: Nose normal.   Mouth/Throat: Oropharynx is clear and moist. No oropharyngeal exudate.   Eyes: Conjunctivae are normal. Pupils are equal, round, and reactive to light.   Neck: Neck supple. No JVD present.   Cardiovascular: Normal rate, regular rhythm and normal heart sounds.    Pulmonary/Chest: Effort normal and breath sounds normal. No respiratory distress. She has no wheezes. She has no rales.   Abdominal: Soft. No tenderness.   Musculoskeletal: She exhibits no edema.   Psychiatric: She has a normal mood and affect.       Assessment:      Cardiomyopathy -- on new medication per Dr. Dory Peru  Hypothyroidism -- no side effects from the medication.  Elevated blood sugar -   I will recheck today  Sleep apnea      Plan:      Recheck labs   I will let her know what to do about the medication.

## 2009-07-14 LAB — T4, FREE: T4 Free: 1.2 ng/dL (ref 0.8–1.8)

## 2009-07-14 LAB — GLUCOSE: Glucose: 109 mg/dL — ABNORMAL HIGH (ref 65–99)

## 2009-07-14 LAB — TSH: TSH: 4.8 mIU/L — ABNORMAL HIGH

## 2009-07-14 LAB — HEMOGLOBIN A1C: A1c: 5.8 % of total Hgb — ABNORMAL HIGH (ref <5.7)

## 2009-07-25 NOTE — Telephone Encounter (Signed)
[facility] CALLING FOR LEVOTHYROXINE 50 MCG. PATIENT SAID IT IS BEING INCREASED AND WAS SUPPOSE TO BE CALLED IN.  [facility]   [id number]

## 2009-07-25 NOTE — Telephone Encounter (Signed)
Pharmacy did not receive electronically, gave verbal.

## 2009-08-03 NOTE — Progress Notes (Signed)
Subjective:      Patient ID: Jillian Warren is a 48 y.o. female.    HPI Comments: Sxs x 4 days or so.    Uses CPAP - nose is running a lot.    Feverish and cold.    Taking Vick's inhaler.    Windows closed at night.    Pharyngitis   Associated symptoms include coughing (dry) and headaches. Pertinent negatives include no abdominal pain, congestion, diarrhea, ear discharge, ear pain, neck pain, shortness of breath or vomiting.       Review of Systems   Constitutional: Positive for fever and chills. Negative for fatigue.   HENT: Positive for sore throat, rhinorrhea (yellowish - in am) and postnasal drip. Negative for hearing loss, ear pain, nosebleeds, congestion, facial swelling, sneezing, neck pain, neck stiffness, voice change, sinus pressure, tinnitus and ear discharge.    Eyes: Negative.  Negative for photophobia, pain, discharge, redness, itching and visual disturbance.   Respiratory: Positive for cough (dry). Negative for shortness of breath and wheezing.    Cardiovascular: Negative for chest pain and palpitations.   Gastrointestinal: Negative for nausea, vomiting, abdominal pain and diarrhea.        Appetite ok   Musculoskeletal: Negative for myalgias and arthralgias.   Skin: Negative for rash and color change.   Neurological: Positive for headaches. Negative for dizziness and light-headedness.   Psychiatric/Behavioral: Negative for sleep disturbance.       Objective:   Physical Exam   Nursing note and vitals reviewed.  Constitutional: She is oriented to person, place, and time. She appears well-developed and well-nourished.   HENT:   Head: Normocephalic and atraumatic.   Right Ear: Hearing, tympanic membrane, external ear and ear canal normal. No drainage or swelling.   Left Ear: Hearing, tympanic membrane, external ear and ear canal normal. No drainage or swelling.   Nose: No mucosal edema, rhinorrhea or septal deviation. Right sinus exhibits no maxillary sinus tenderness and no frontal sinus tenderness. Left  sinus exhibits no maxillary sinus tenderness and no frontal sinus tenderness.   Mouth/Throat: Uvula is midline and mucous membranes are normal. No uvula swelling. No oropharyngeal exudate, posterior oropharyngeal edema, posterior oropharyngeal erythema or tonsillar abscesses.           Eyes: Conjunctivae, extraocular motions and lids are normal. Pupils are equal, round, and reactive to light. Lids are everted and swept, no foreign bodies found. Right eye exhibits no discharge and no exudate. Left eye exhibits no discharge and no exudate. Right conjunctiva is not injected. Left conjunctiva is not injected.   Neck: Trachea normal and normal range of motion. Neck supple.   Cardiovascular: Normal rate, regular rhythm, normal heart sounds and intact distal pulses.  Exam reveals no gallop and no friction rub.    No murmur heard.  Pulmonary/Chest: Effort normal and breath sounds normal. No respiratory distress. She has no wheezes. She has no rales. She exhibits no tenderness.   Lymphadenopathy:     She has no cervical adenopathy.   Neurological: She is alert and oriented to person, place, and time.   Skin: Skin is warm and dry. No rash noted.   Psychiatric: She has a normal mood and affect. Her behavior is normal. Judgment normal.       Assessment:      1. URI (upper respiratory infection) (465.9J)      Likely viral at this stage. Mucinex for now. Zithromax if symptoms worsen.   2. Unspecified hypothyroidism (244.9)  levothyroxine (SYNTHROID)  50 MCG tablet    Pt needs refill for synthroid to mail away.done.             Plan:      See Assessment section of note above.

## 2009-08-03 NOTE — Patient Instructions (Signed)
Mucinex (Blue box) 600-1200mg  every 12 hours x 7-10 days    Start Zithromax if your symptoms are not improving or are worsening by the weekend.

## 2010-02-13 NOTE — Patient Instructions (Signed)
Amitiza 24 mcg twice daily with meals.

## 2010-02-13 NOTE — Progress Notes (Signed)
Subjective:      Patient ID: Jillian Warren is a 49 y.o. female.    HPI    She has 1-2 weeks synthroid remaining. On brand-name.    She had AICD placed. Also has pacer.    Still fatigued.    Working (she teaches) and working out. Feeling well otherwise.    Hair still thin - thinks worse with synthroid.    She is experiencing constipation and has been for quite some time. OTC Miralax helps, but is unpredictable. Is interested in other options.      Review of Systems  As noted in HPI  Objective:   Physical Exam   Nursing note and vitals reviewed.  Constitutional: She is oriented to person, place, and time. She appears well-developed and well-nourished. No distress.        BP 100/72   Pulse 84   Wt 222 lb (100.699 kg)     Eyes: Conjunctivae and EOM are normal. Pupils are equal, round, and reactive to light.   Neck: Normal range of motion. Neck supple. No thyromegaly present.   Cardiovascular: Normal rate, regular rhythm and normal heart sounds.  Exam reveals no gallop and no friction rub.    No murmur heard.  Pulmonary/Chest: Effort normal and breath sounds normal. No respiratory distress.   Musculoskeletal: She exhibits no edema.   Neurological: She is alert and oriented to person, place, and time. She has normal reflexes.   Skin: Skin is warm. No pallor.   Psychiatric: She has a normal mood and affect. Her behavior is normal. Judgment and thought content normal.       Assessment:      1. Unspecified hypothyroidism  T4, FREE, TSH, levothyroxine (SYNTHROID) 50 MCG tablet    due for TFTs.  Will adjust synthroid, if needed.   2. Constipation, chronic  lubiprostone (AMITIZA) 24 MCG capsule, lubiprostone (AMITIZA) 24 MCG capsule    Trial of Amitiza - samples provided.  Rx for mail away.           Plan:      See Assessment section of note above.

## 2010-02-15 LAB — T4, FREE: T4 Free: 1 ng/dL (ref 0.8–1.8)

## 2010-02-15 LAB — TSH: TSH: 3.57 mIU/L

## 2010-03-16 ENCOUNTER — Telehealth

## 2010-03-16 NOTE — Telephone Encounter (Signed)
Jillian Warren wrote prescription for mail in on Synthroid but is out and mail order will not be here in time.  Would like a refill called in to a retail pharm, leaving on vacation for 2 weeks and will not have any to take.  Pharm  [facility].  Pt# [phone]

## 2010-03-16 NOTE — Telephone Encounter (Signed)
Prescription sent

## 2010-03-16 NOTE — Telephone Encounter (Signed)
Patient of Dr Ellamae Sia.

## 2010-04-14 ENCOUNTER — Encounter

## 2010-04-14 NOTE — Telephone Encounter (Signed)
[facility]

## 2010-04-14 NOTE — Telephone Encounter (Signed)
In your box

## 2010-04-14 NOTE — Telephone Encounter (Signed)
Print for mail order  [facility] [phone]

## 2010-04-18 NOTE — Telephone Encounter (Signed)
Opened in error

## 2010-04-20 ENCOUNTER — Encounter

## 2010-04-20 NOTE — Telephone Encounter (Signed)
Pt needs refill on Synthyroid (DAW) sent to [facility] [phone].  Please advise pt.  REf # [id number].  Pt had refills on file with [facility] but she needs mail away.  [facility] [place]-  Pts number [phone]

## 2010-04-21 NOTE — Telephone Encounter (Signed)
In Julie's box. Repeat labs in  4 mos.

## 2010-04-21 NOTE — Telephone Encounter (Signed)
rx faxed.

## 2010-04-25 ENCOUNTER — Telehealth

## 2010-04-25 NOTE — Telephone Encounter (Signed)
Was originally written as DAW. I will re-write this rx and sign - in your box. Thanks.

## 2010-04-25 NOTE — Telephone Encounter (Signed)
I usually recommend DAW - brand name unless the pt requests generic. Please ask Jillian Warren her preference. Thanks.

## 2010-04-25 NOTE — Telephone Encounter (Signed)
[facility]

## 2010-04-25 NOTE — Telephone Encounter (Signed)
Lm for pt to call.

## 2010-04-25 NOTE — Telephone Encounter (Signed)
Pt prefers brand name.

## 2010-04-25 NOTE — Telephone Encounter (Signed)
[facility] CALLING AND THEY HAVE A REQUEST FOR SYNTHROID . THEY NEED TO KNOW IF PATIENT GETS NAME BRAND OR THE GENERIC   [phone] X [phone]

## 2010-06-13 NOTE — Telephone Encounter (Signed)
Created in error

## 2010-09-22 NOTE — Progress Notes (Signed)
Subjective:      Patient ID: Jillian Warren is a 49 y.o. female.  Chief Complaint   Patient presents with   ??? Medication Refill     thyroid meds   ??? Flu Vaccine       HPI  [name] is here for her thyroid refill.  Checked last in May.    She saw Dr. Dory Peru in cardiology - he switched her to losartan from valsartan last week. She is surprised her BP is up a little today. Next FOV 6 mos.    She is off the lasix - she tells me hers is expired - she normally goes through Cardiology to have this filled, but would like this today.  She also takes spironolactone - has enough of this. She also needs potassium refilled.    No CP. She denies SOB.    She admits to not watching her diet - gained 10# this holiday season. Clothes getting tighter. Plans to work on diet and exercise soon.        Review of Systems   Constitutional: Negative for fatigue.   HENT: Negative for nosebleeds and neck pain.    Gastrointestinal: Positive for anal bleeding (with straining at times - switched to metamucil; did not like the amitiza due to loose stools.). Negative for abdominal pain and constipation.   Skin: Negative.    Neurological: Positive for dizziness (occasionally), numbness (LEFT arm occasionally tingly when sleeping) and headaches. Negative for weakness.   Psychiatric/Behavioral: Negative for sleep disturbance.       Objective:   Physical Exam   Nursing note and vitals reviewed.  Constitutional: She is oriented to person, place, and time. She appears well-developed and well-nourished. No distress.        BP 120/88   Pulse 85   Temp(Src) 98.3 ??F (36.8 ??C) (Oral)   Resp 14   Ht 5\' 5"  (1.651 m)   Wt 241 lb (109.317 kg)   BMI 40.10 kg/m2   SpO2 97%     Neck: Carotid bruit is not present. No mass and no thyromegaly present.   Cardiovascular: Normal rate, regular rhythm and normal heart sounds.  Exam reveals no gallop.    No murmur heard.  Pulmonary/Chest: Effort normal and breath sounds normal.   Musculoskeletal: She exhibits edema (+1  bilaterally in LEs).   Neurological: She is alert and oriented to person, place, and time. No sensory deficit.   Reflex Scores:       Tricep reflexes are 2+ on the right side and 2+ on the left side.       Bicep reflexes are 2+ on the right side and 2+ on the left side.       Brachioradialis reflexes are 2+ on the right side and 2+ on the left side.       Patellar reflexes are 2+ on the right side and 2+ on the left side.       Achilles reflexes are 2+ on the right side and 2+ on the left side.  Psychiatric: She has a normal mood and affect. Her behavior is normal. Judgment and thought content normal.       Assessment:      1. Unspecified hypothyroidism  Lipid panel, Comprehensive metabolic panel, TSH, T4, free, levothyroxine (SYNTHROID) 50 MCG tablet   2. Need for influenza vaccination  Flu Vaccine greater than or = 50yo IM   3. Edema  furosemide (LASIX) 40 MG tablet, potassium chloride SA (KLOR-CON M20) 20 MEQ  tablet           Plan:      Hypothyroid - check TFTs, CMP, lipids as fasting. Adjust synthroid, if needed.    Edema - Lasix refilled. KCl refilled as well.    Flu vaccine today.

## 2010-09-29 LAB — T4, FREE: T4 Free: 1.3 ng/dL (ref 0.8–1.8)

## 2010-09-29 LAB — LIPID PANEL
Chol/HDL Ratio: 5.7 (calc) — ABNORMAL HIGH (ref <=5.0)
Cholesterol, Total: 183 mg/dL (ref 125–200)
HDL: 32 mg/dL — ABNORMAL LOW (ref >=46)
LDL Calculated: 102 mg/dL (calc) (ref <130)
Triglycerides: 244 mg/dL — ABNORMAL HIGH (ref <150)

## 2010-09-29 LAB — COMPREHENSIVE METABOLIC PANEL
ALT: 20 U/L (ref 6–40)
AST: 20 U/L (ref 10–35)
Albumin/Globulin Ratio: 1.2 (calc) (ref 1.0–2.1)
Albumin: 4.1 g/dL (ref 3.6–5.1)
Alkaline Phosphatase: 43 U/L (ref 33–130)
BUN: 16 mg/dL (ref 7–25)
CO2: 27 mmol/L (ref 21–33)
Calcium: 9.4 mg/dL (ref 8.6–10.2)
Chloride: 102 mmol/L (ref 98–110)
Creatinine: 0.85 mg/dL (ref 0.60–1.10)
Est, Glom Filt Rate: 80 mL/min/{1.73_m2} (ref >=60)
Globulin: 3.3 g/dL (calc) (ref 2.2–3.9)
Glucose: 118 mg/dL — ABNORMAL HIGH (ref 65–99)
Potassium: 4.8 mmol/L (ref 3.5–5.3)
Sodium: 138 mmol/L (ref 135–146)
Total Bilirubin: 0.7 mg/dL (ref 0.2–1.2)
Total Protein: 7.4 g/dL (ref 6.2–8.3)
eGFR African American: 93 mL/min/{1.73_m2} (ref >=60)

## 2010-09-29 LAB — TSH: TSH: 2.85 mIU/L

## 2010-10-06 NOTE — Telephone Encounter (Signed)
Pt informed that a letter is in the mail

## 2010-10-06 NOTE — Telephone Encounter (Signed)
Patient wants her lab results C/b [phone]

## 2011-01-02 ENCOUNTER — Encounter

## 2011-01-02 NOTE — Telephone Encounter (Signed)
Not sure why pt needs refill.  Printed rx was given to her in December.  Lm for pt to call.

## 2011-01-02 NOTE — Telephone Encounter (Signed)
LOV 09/22/10  L/R 09/22/10   #90 x 1  No appt scheduled

## 2011-01-03 NOTE — Telephone Encounter (Signed)
Pt states she didn't mail in time and she leaves to go out of town before shipment is expected to arrive.

## 2011-01-03 NOTE — Telephone Encounter (Signed)
done

## 2011-03-21 NOTE — Progress Notes (Signed)
Subjective:      Patient ID: Jillian Warren is a 50 y.o. female.  Chief Complaint   Patient presents with   ??? Cough     x 3 days   ??? Chest Congestion     HPI  Pt tells me she has had URI sxs x 3 days.  Tried Vick's Vapo Rub.  Pt wanted to ensure she didn't need antibiotics.    Doing well otherwise.    HM-  Pt is being seen by oncologist annually for her breast CA. Mammogram is UTD.  Pap is UTD as well.  Colonoscopy - needs baseline. Turned 49yo last Nov.    She has cardiology appt scheduled July 9th with Dr. Dory Peru.    Review of Systems   Constitutional: Positive for fever (No higher than 99.60F) and fatigue. Negative for appetite change.   HENT: Positive for congestion, sore throat, rhinorrhea and sinus pressure. Negative for hearing loss, ear pain, nosebleeds, facial swelling, sneezing, neck pain, neck stiffness, voice change, postnasal drip, tinnitus and ear discharge.    Eyes: Negative.  Negative for photophobia, pain, discharge, redness, itching and visual disturbance.   Respiratory: Positive for cough and wheezing (at times). Negative for chest tightness and shortness of breath.    Cardiovascular: Negative for chest pain and palpitations.        ICD has not fired.   Gastrointestinal: Negative for nausea, vomiting, abdominal pain and diarrhea.   Musculoskeletal: Negative for myalgias and arthralgias.   Skin: Negative for rash.   Neurological: Negative for dizziness, light-headedness and headaches.   Hematological: Negative for adenopathy.   Psychiatric/Behavioral: Negative for sleep disturbance.       Objective:   Physical Exam   Nursing note and vitals reviewed.  Constitutional: She is oriented to person, place, and time. She appears well-developed and well-nourished. No distress.        BP 102/68   Pulse 94   Temp(Src) 99.5 ??F (37.5 ??C) (Oral)   Ht 5\' 5"  (1.651 m)   Wt 238 lb 6.4 oz (108.138 kg)   BMI 39.67 kg/m2   SpO2 96%     HENT:   Head: Normocephalic and atraumatic.   Right Ear: Hearing, tympanic membrane,  external ear and ear canal normal. No drainage or swelling.   Left Ear: Hearing, tympanic membrane, external ear and ear canal normal. No drainage or swelling.   Nose: No mucosal edema, rhinorrhea or septal deviation. Right sinus exhibits no maxillary sinus tenderness and no frontal sinus tenderness. Left sinus exhibits no maxillary sinus tenderness and no frontal sinus tenderness.   Mouth/Throat: Uvula is midline and mucous membranes are normal. No uvula swelling. No oropharyngeal exudate, posterior oropharyngeal edema, posterior oropharyngeal erythema or tonsillar abscesses.        clear post nasal drainage         Eyes: Conjunctivae, EOM and lids are normal. Pupils are equal, round, and reactive to light. No foreign bodies found. Right eye exhibits no discharge and no exudate. Left eye exhibits no discharge and no exudate. Right conjunctiva is not injected. Left conjunctiva is not injected.   Neck: Trachea normal and normal range of motion. Neck supple.   Cardiovascular: Normal rate, regular rhythm, normal heart sounds and intact distal pulses.  Exam reveals no gallop and no friction rub.    No murmur heard.  Pulmonary/Chest: Effort normal. No respiratory distress. She has wheezes. She has no rales. She exhibits no tenderness.   Lymphadenopathy:  Head (right side): Submental and submandibular adenopathy present. No tonsillar, no preauricular, no posterior auricular and no occipital adenopathy present.        Head (left side): Submental and submandibular adenopathy present. No tonsillar, no preauricular, no posterior auricular and no occipital adenopathy present.     She has no cervical adenopathy.   Neurological: She is alert and oriented to person, place, and time.   Skin: Skin is warm and dry. No rash noted.   Psychiatric: She has a normal mood and affect. Her behavior is normal. Judgment normal.       Assessment:      1. URI (upper respiratory infection)    2. Unspecified viral infection, in conditions  classified elsewhere and of unspecified site    3. Bronchitis, acute    4. Colon cancer screening            Plan:      1. URI (upper respiratory infection)  benzonatate (TESSALON PERLES) 100 MG capsule   2. Unspecified viral infection, in conditions classified elsewhere and of unspecified site     3. Bronchitis, acute     4. Colon cancer screening  Ambulatory referral to Gastroenterology             Pt reassured this is viral and conservative mgt desired by pt. She is refusing albuterol.  Pt will call if sxs worsen over the next week. Consider antibiotic if sxs become suggestive of bacterial infection.

## 2011-03-21 NOTE — Patient Instructions (Signed)
Mucinex (Blue box) 600-1200mg  every 12 hours x 7-10 days    Tessalon perles for cough. Rx.    Call if the mucous becomes persistently green; dark yellow or any blood noted in it; if temp 100F or greater over the weekend. Fever now likely due to viral infection.    ________________________________________________________________________    Gastroenterology     Vevelyn Francois    [phone]   Duwaine Maxin Bongiovanni    [phone]   Barbette Merino    [phone]       Maurine Simmering    [phone]   [name]      [facility])   [phone]

## 2011-09-12 NOTE — Telephone Encounter (Signed)
Left message for pt to call back for an appt.  Last seen 12/11

## 2011-09-12 NOTE — Telephone Encounter (Signed)
Patient returned Julie's phone call at 11:05 AM. Patient call back number: [phone].

## 2011-09-17 NOTE — Patient Instructions (Addendum)
Gastroenterology     Jillian Warren    [phone]   Jillian Warren    [phone]   Jillian Warren    [phone]       Jillian Warren    [phone]   [name]      [facility])   [phone]     ________________________________________________________________________      I recommend you schedule a physical as your follow up appointment.

## 2011-09-17 NOTE — Progress Notes (Signed)
Subjective:      Patient ID: Jillian Warren is a 50 y.o. female.  Chief Complaint   Patient presents with   . Hypothyroidism     follow up on meds       HPI  Ms. Authier was last seen in Dec of last year for her hypothyroidism.  She is due for labs.   Constipation - not new.  Numbness in RIGHT side with lying on RIGHT side. Resolved with change in position.  Skin- normal. Hair has been drier than normal.    She sees Cardiology for her cardiomyopathy. She has last seen in August - consult letter reviewed. She is due for FOV. Discussed. She appt with Dr. Ayesha Rumpf this week.        ________________________________________________________________________  Outpatient Prescriptions Marked as Taking for the 09/17/11 encounter (Office Visit) with Enid Baas, NP   Medication Sig Dispense Refill   . levothyroxine (SYNTHROID) 50 MCG tablet Take 1 tablet by mouth daily.  90 tablet  0   . losartan (COZAAR) 50 MG tablet Take 50 mg by mouth daily.         . furosemide (LASIX) 40 MG tablet Take 1 tablet by mouth daily as needed for Other (weight gain > 2 pounds ).  90 tablet  0   . potassium chloride SA (KLOR-CON M20) 20 MEQ tablet Take 1 tablet by mouth daily as needed.  90 tablet  0   . spironolactone (ALDACTONE) 25 MG tablet Take 25 mg by mouth daily.       . tamoxifen (NOLVADEX) 20 MG tablet Take 20 mg by mouth daily.       . carvedilol (COREG) 6.25 MG tablet Take 12.5 mg by mouth 2 times daily (with meals).       . Multiple Vitamin (MULTIVITAMIN PO) Take  by mouth daily.       . Calcium Citrate (CITRACAL PO) Take  by mouth.         ________________________________________________________________________  Health Maintenance Due   Topic   . Tetanus Vaccine Adult (11 Years And Up)    . Cervical Cancer Screening - GYN - Sept 17, 2012 Dr. Rhetta Mura   . Breast Cancer Screening - 2012 Sept 17th   . Colon Cancer Screening Colonoscopy - Not yet. discussed    ________________________________________________________________________    Review of Systems   Constitutional: Negative for fever and fatigue.   HENT: Negative for trouble swallowing and neck stiffness.    Respiratory: Negative for shortness of breath.    Cardiovascular: Negative for palpitations.   Gastrointestinal: Positive for constipation (intermittent). Negative for abdominal pain and diarrhea.   Skin:        Dry hair   Neurological: Positive for numbness (RIGHT side when sleeping on it - intermittent). Negative for weakness.       Objective:   Physical Exam   Nursing note and vitals reviewed.  Constitutional: She is oriented to person, place, and time. She appears well-developed and well-nourished. No distress.        BP 118/80  Pulse 82  Temp(Src) 97.7 F (36.5 C) (Oral)  Ht 5\' 5"  (1.651 m)  Wt 241 lb (109.317 kg)  BMI 40.10 kg/m2  SpO2 98%     Cardiovascular: Normal rate, regular rhythm, normal heart sounds and intact distal pulses.  Exam reveals no gallop and no friction rub.    No murmur heard.  Pulmonary/Chest: Effort normal and breath sounds normal.   Musculoskeletal: She exhibits no edema.  Neurological: She is alert and oriented to person, place, and time. She has normal reflexes. No sensory deficit.   Reflex Scores:       Tricep reflexes are 2+ on the right side and 2+ on the left side.       Bicep reflexes are 2+ on the right side and 2+ on the left side.       Brachioradialis reflexes are 2+ on the right side and 2+ on the left side.       Patellar reflexes are 2+ on the right side and 2+ on the left side.       Achilles reflexes are 2+ on the right side and 2+ on the left side.      Assessment:      1. Unspecified hypothyroidism    2. Colon cancer screening    3. Encounter for long-term (current) use of medications            Plan:      1. Unspecified hypothyroidism  TSH, T4, free, Lipid panel, Basic metabolic panel   2. Colon cancer screening  Ambulatory referral to Gastroenterology    3. Encounter for long-term (current) use of medications  Basic metabolic panel

## 2011-09-18 ENCOUNTER — Encounter

## 2011-09-18 LAB — LIPID PANEL
Chol/HDL Ratio: 6.1 (calc) — ABNORMAL HIGH (ref <=5.0)
Cholesterol, Total: 178 mg/dL (ref 125–200)
HDL: 29 mg/dL — ABNORMAL LOW (ref >=46)
LDL Calculated: 88 mg/dL (calc) (ref <130)
Non-HDL Cholesterol: 149 mg/dL (calc)
Triglycerides: 305 mg/dL — ABNORMAL HIGH (ref <150)

## 2011-09-18 LAB — BASIC METABOLIC PANEL
BUN: 12 mg/dL (ref 7–25)
CO2: 26 mmol/L (ref 21–33)
Calcium: 10.1 mg/dL (ref 8.6–10.4)
Chloride: 101 mmol/L (ref 98–110)
Creatinine: 0.86 mg/dL (ref 0.50–1.05)
Est, Glom Filt Rate: 78 mL/min/{1.73_m2} (ref >=60)
Glucose: 111 mg/dL — ABNORMAL HIGH (ref 65–99)
Potassium: 4.5 mmol/L (ref 3.5–5.3)
Sodium: 139 mmol/L (ref 135–146)
eGFR African American: 91 mL/min/{1.73_m2} (ref >=60)

## 2011-09-18 LAB — T4, FREE: T4 Free: 1.4 ng/dL (ref 0.8–1.8)

## 2011-09-18 LAB — TSH: TSH: 5.21 mIU/L — ABNORMAL HIGH

## 2011-12-10 ENCOUNTER — Telehealth

## 2011-12-10 MED ORDER — LEVOTHYROXINE SODIUM 50 MCG PO TABS
50 MCG | ORAL_TABLET | Freq: Every day | ORAL | Status: DC
Start: 2011-12-10 — End: 2013-05-21

## 2011-12-10 NOTE — Telephone Encounter (Signed)
Let her know that she should have her thyroid level rechecked on the new dose.

## 2011-12-10 NOTE — Telephone Encounter (Signed)
Lm for pt to have to blood work.  Told her to call office for orders.

## 2011-12-10 NOTE — Telephone Encounter (Signed)
LOV  09/17/11  L/F  09/18/11 #90 x 0  Llabs  09/17/11    APPT  None scheduled

## 2012-04-30 MED ADMIN — Tdap (ADACEL) injection 0.5 mL: INTRAMUSCULAR | @ 22:00:00

## 2012-04-30 NOTE — Progress Notes (Signed)
Subjective:      Patient ID: Jillian Warren is a 51 y.o. female.    HPI  She thinks she injured her shoulder and wants to have it evaluated.  She needs to have her thyroid checked also.  Her last EF was down again.     She switched from Diovan to Losartan and synthroid to levothyroxine.  She has been more tired.       Review of Systems   Constitutional: Positive for fatigue.   Respiratory: Negative for shortness of breath.    Cardiovascular: Negative for chest pain and leg swelling.       Objective:   Physical Exam   Constitutional: She is oriented to person, place, and time. She appears well-developed and well-nourished. No distress.   HENT:   Nose: Nose normal.   Mouth/Throat: Oropharynx is clear and moist.   Eyes: Conjunctivae are normal. Pupils are equal, round, and reactive to light.   Neck: No JVD present. No thyromegaly present.   Cardiovascular: Normal rate, regular rhythm and normal heart sounds.    Pulmonary/Chest: Effort normal and breath sounds normal. No respiratory distress. She has no wheezes.   Musculoskeletal: She exhibits tenderness (upper left deltoid).   Neurological: She is alert and oriented to person, place, and time.   Psychiatric: She has a normal mood and affect.   Blood pressure 114/82, pulse 96, temperature 98.7 ??F (37.1 ??C), temperature source Oral, weight 238 lb (107.956 kg), SpO2 96.00%.      Assessment:      Shoulder strain  Hypothyroidism -- due for labs   Cardiomyopathy -- ? Worse than before.  Sleep apnea.    Her CPAP pressure was reduced but she does not think that is the problem.       Plan:      Acupuncture left shoulder    GB 21   LI 15,14,11    TH 14,13     Ant/post shoulder points  Recheck thyroid  Follow up with cardiology about her heart.   Consider going back to Diovan to see if that makes a difference.  Tdap

## 2012-05-05 LAB — COMPREHENSIVE METABOLIC PANEL
ALT: 24 U/L (ref 10–40)
AST: 26 U/L (ref 15–37)
Albumin/Globulin Ratio: 1.2 (ref 1.1–2.2)
Albumin: 4.2 g/dL (ref 3.4–5.0)
Alkaline Phosphatase: 52 U/L (ref 45–129)
BUN: 16 mg/dL (ref 7–18)
CO2: 27 mEq/L (ref 21–32)
Calcium: 10.1 mg/dL (ref 8.3–10.6)
Chloride: 104 mEq/L (ref 99–110)
Creatinine: 0.9 mg/dL (ref 0.6–1.1)
GFR African American: >60 (ref >60)
GFR Non-African American: >60 (ref >60)
Globulin: 3 g/dL
Glucose: 117 mg/dL — ABNORMAL HIGH (ref 70–99)
Potassium: 4.8 mEq/L (ref 3.5–5.1)
Sodium: 145 mEq/L (ref 136–145)
Total Bilirubin: 0.5 mg/dL (ref 0.00–1.00)
Total Protein: 7.6 g/dL (ref 6.4–8.2)

## 2012-05-05 LAB — LIPID PANEL
Cholesterol, Total: 170 mg/dL (ref 0–199)
HDL: 35 mg/dL — ABNORMAL LOW (ref 40–60)
LDL Calculated: 81 mg/dL (ref 0–99)
Triglycerides: 270 mg/dL — ABNORMAL HIGH (ref 0–149)
VLDL Cholesterol Calculated: 54 mg/dL

## 2012-05-05 LAB — T4, FREE: T4 Free: 1.4 ng/dL (ref 0.9–1.8)

## 2012-05-05 LAB — TSH: TSH: 3.64 u[IU]/mL (ref 0.35–5.50)

## 2012-05-06 LAB — HEMOGLOBIN A1C
Hemoglobin A1C: 6.3 %
eAG: 134.1 mg/dL

## 2012-05-08 NOTE — Telephone Encounter (Signed)
Pt calling for results of Blood work, please call pt back at [phone].

## 2012-05-09 NOTE — Telephone Encounter (Signed)
Lm for pt to call.

## 2012-05-09 NOTE — Telephone Encounter (Signed)
Results given to pt.  Advised her lab letter was mailed.

## 2013-05-12 DIAGNOSIS — G4733 Obstructive sleep apnea (adult) (pediatric): Secondary | ICD-10-CM | POA: Insufficient documentation

## 2013-05-21 MED ORDER — LEVOTHYROXINE SODIUM 75 MCG PO TABS
75 MCG | ORAL_TABLET | Freq: Every day | ORAL | Status: DC
Start: 2013-05-21 — End: 2013-07-30

## 2013-05-21 MED ORDER — GABAPENTIN 300 MG PO CAPS
300 MG | ORAL_CAPSULE | Freq: Three times a day (TID) | ORAL | Status: DC
Start: 2013-05-21 — End: 2013-07-01

## 2013-05-21 NOTE — Progress Notes (Signed)
Subjective:      Patient ID: Jillian Warren is a 52 y.o. female.    HPI  She is here for follow up.  Her heart has not been doing as well.      She is using CPAP at night and just did another sleep study.  Her oxygen was dropping too low during the night.      She may need to add oxygen.   She felt better going back on Diovan.    She did not like Losartan.  She goes back to work next week.  She has numbness and pain in her feet.   She takes tylenol at night.    Review of Systems   Constitutional: Positive for fatigue.   Respiratory: Negative for shortness of breath.    Cardiovascular: Negative for chest pain and leg swelling.   Psychiatric/Behavioral: Negative for sleep disturbance and decreased concentration.       Objective:   Physical Exam   Constitutional: She is oriented to person, place, and time. She appears well-developed and well-nourished. No distress.   HENT:   Nose: Nose normal.   Mouth/Throat: Oropharynx is clear and moist.   Eyes: Conjunctivae are normal. Pupils are equal, round, and reactive to light.   Neck: Neck supple. No JVD present. No thyromegaly present.   Cardiovascular: Normal rate, regular rhythm and normal heart sounds.    Pulmonary/Chest: Effort normal and breath sounds normal. No respiratory distress. She has no wheezes.   Musculoskeletal: She exhibits no edema.   Foot exam -- decreased monofilament testing on toes, small callous on heels.    No open areas.       Lymphadenopathy:     She has no cervical adenopathy.   Neurological: She is alert and oriented to person, place, and time.   Psychiatric: She has a normal mood and affect.   Blood pressure 100/66, pulse 76, height 5\' 5"  (1.651 m), weight 244 lb (110.678 kg).      Assessment:      Cardiomyopathy -- followed by cardiology  Hypothyroid -- she is due for labs  Peripheral neuropathy -- worse at night.   This has been attributed to her chemotherapy.  Pre diabetes -- she says she enjoys food and has a hard time with dieting      Plan:       Try gabapentin at bedtime.   Increase to TID as tolerated.  Check labs    refill synthroid

## 2013-05-23 LAB — COMPREHENSIVE METABOLIC PANEL
ALT: 19 U/L (ref 10–40)
AST: 19 U/L (ref 15–37)
Albumin/Globulin Ratio: 1.5 (ref 1.1–2.2)
Albumin: 4.5 g/dL (ref 3.4–5.0)
Alkaline Phosphatase: 64 U/L (ref 40–129)
BUN: 13 mg/dL (ref 7–20)
CO2: 28 mmol/L (ref 21–32)
Calcium: 10.1 mg/dL (ref 8.3–10.6)
Chloride: 98 mmol/L — ABNORMAL LOW (ref 99–110)
Creatinine: 0.8 mg/dL (ref 0.6–1.1)
GFR African American: >60 (ref >60)
GFR Non-African American: >60 (ref >60)
Globulin: 3.1 g/dL
Glucose: 154 mg/dL — ABNORMAL HIGH (ref 70–99)
Potassium: 4.8 mmol/L (ref 3.5–5.1)
Sodium: 138 mmol/L (ref 136–145)
Total Bilirubin: 1.1 mg/dL — ABNORMAL HIGH (ref 0.0–1.0)
Total Protein: 7.6 g/dL (ref 6.4–8.2)

## 2013-05-23 LAB — LIPID PANEL
Cholesterol, Total: 192 mg/dL (ref 0–199)
HDL: 28 mg/dL — ABNORMAL LOW (ref 40–60)
LDL Calculated: 119 mg/dL — ABNORMAL HIGH (ref <100)
Triglycerides: 223 mg/dL — ABNORMAL HIGH (ref 0–150)
VLDL Cholesterol Calculated: 45 mg/dL

## 2013-05-23 LAB — HEMOGLOBIN A1C
Hemoglobin A1C: 7.1 %
eAG: 157.1 mg/dL

## 2013-05-23 LAB — T4, FREE: T4 Free: 1.7 ng/ml (ref 0.9–1.8)

## 2013-05-23 LAB — TSH: TSH: 2.69 u[IU]/mL (ref 0.27–4.20)

## 2013-05-25 ENCOUNTER — Encounter

## 2013-05-26 MED ORDER — METFORMIN HCL ER 500 MG PO TB24
500 MG | ORAL_TABLET | Freq: Every day | ORAL | Status: AC
Start: 2013-05-26 — End: ?

## 2013-07-01 MED ORDER — GABAPENTIN 300 MG PO CAPS
300 MG | ORAL_CAPSULE | Freq: Three times a day (TID) | ORAL | Status: DC
Start: 2013-07-01 — End: 2013-11-19

## 2013-07-01 NOTE — Telephone Encounter (Signed)
Last visit 05/21/13.

## 2013-07-01 NOTE — Telephone Encounter (Signed)
Patient called and said that she needs a refill on medication Gabapentin 300 mg tablets and metformin.  Pharmacy: [facility]. Patient call back number: [phone].

## 2013-07-30 MED ORDER — LEVOTHYROXINE SODIUM 75 MCG PO TABS
75 MCG | ORAL_TABLET | ORAL | Status: AC
Start: 2013-07-30 — End: ?

## 2013-07-30 NOTE — Telephone Encounter (Signed)
Last visit 05/21/13 w/ labs.

## 2013-08-26 MED ORDER — LETROZOLE 2.5 MG PO TABS
2.5 MG | ORAL_TABLET | ORAL | Status: DC
Start: 2013-08-26 — End: 2014-07-12

## 2013-11-19 NOTE — Telephone Encounter (Signed)
Last OV 05/21/13

## 2013-11-20 MED ORDER — GABAPENTIN 300 MG PO CAPS
300 MG | ORAL_CAPSULE | ORAL | Status: DC
Start: 2013-11-20 — End: 2013-12-18

## 2013-11-20 NOTE — Progress Notes (Signed)
ONCOLOGY FOLLOW-UP:       Primary Oncologist: Para March, MD    PROBLEM LIST:       Patient Active Problem List   Diagnosis Code   ??? Cardiomyopathy 425.4   ??? Adjustment Disorder with Anxiety 309.24   ??? BREAST CANCER 174.9   ??? Unspecified Hypothyroidism 244.9   ??? Sleep Apnea 780.57   ??? Constipation, chronic 564.00   ??? 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 T1 N1 M0, hormone-receptor-positive, HER-2/neu-negative left breast cancer diagnosed in 05/2007, status post left lumpectomy with axillary lymph node dissection followed by dose-dense AC, followed by dose-dense Taxol, followed by adjuvant radiation.  2. 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 Boyd Kerbs.  3.   Completion of five years of tamoxifen 20 mg daily, 05/2012.     Current Oncologic Therapy:       Letrozole 2.5 mg daily per the MA-17 data, to be complete in 05/2017.    INTERVAL HISTORY:       Her EF has improved to the 30% range.  Still having neuropathy.    Review of Systems:       Review of Systems   Constitutional: Negative.         Hot flashes   HENT: Negative.    Eyes: Negative.    Respiratory: Negative.    Cardiovascular: Negative.    Gastrointestinal: Negative.    Genitourinary: Negative.    Musculoskeletal: Negative.    Skin: Negative.    Neurological: Negative.    Endo/Heme/Allergies: Negative.      PHYSICAL EXAM:       BP 106/76    Pulse 76    Temp(Src) 98 ??F (36.7 ??C)    Resp 12    Ht 5' 5" (1.651 m)    Wt 251 lb (113.853 kg)    BMI 41.77 kg/m2       Physical Exam   Constitutional: She is oriented to person, place, and time. She appears well-developed and well-nourished. No distress.   HENT:   Head: Normocephalic and atraumatic.   Mouth/Throat: Oropharynx is clear and moist.   Eyes: Conjunctivae are normal. No scleral icterus.   Neck: Neck supple. No JVD present.   Cardiovascular: Normal rate,  regular rhythm and normal heart sounds.  Exam reveals no gallop and no friction rub.    No murmur heard.  Pulmonary/Chest: Effort normal and breath sounds normal. She has no wheezes. She has no rales.   Abdominal: Soft. Bowel sounds are normal. She exhibits no distension and no mass. There is no tenderness. There is no rebound and no guarding.   Musculoskeletal: She exhibits no edema.   Lymphadenopathy:     She has no cervical adenopathy.   Neurological: She is alert and oriented to person, place, and time.   Skin: Skin is warm and dry. No rash noted. No erythema. No pallor.   Psychiatric: She has a normal mood and affect. Her behavior is normal. Thought content normal.       Current Meds:       Prior to Admission medications    Medication Sig Start Date End Date Taking? Authorizing Provider   gabapentin (NEURONTIN) 300 MG capsule TAKE 1 CAPSULE THREE TIMES A DAY 11/19/13  Yes Fae Pippin, MD   letrozole [facility]) 2.5 MG tablet TAKE 1 TABLET (2.5 MG) DAILY 08/26/13  Yes Saralyn Pilar  J Jaysha Lasure, MD   levothyroxine (SYNTHROID) 75 MCG tablet TAKE 1 TABLET DAILY 07/30/13  Yes Fae Pippin, MD   valsartan (DIOVAN) 80 MG tablet Take 80 mg by mouth daily.   Yes Historical Provider, MD   furosemide (LASIX) 40 MG tablet Take 1 tablet by mouth daily as needed for Other (weight gain > 2 pounds ). 09/22/10  Yes Saul Fordyce, NP   potassium chloride SA (KLOR-CON M20) 20 MEQ tablet Take 1 tablet by mouth daily as needed. 09/22/10  Yes Saul Fordyce, NP   spironolactone (ALDACTONE) 25 MG tablet Take 25 mg by mouth daily.   Yes Historical Provider, MD   carvedilol (COREG) 6.25 MG tablet Take 12.5 mg by mouth 2 times daily (with meals).   Yes Historical Provider, MD   Multiple Vitamin (MULTIVITAMIN PO) Take  by mouth daily. 03/16/09  Yes Historical Provider, MD   Calcium Citrate (CITRACAL PO) Take  by mouth.   Yes Historical Provider, MD   metFORMIN ER (GLUCOPHAGE XR) 500 MG XR tablet Take 1 tablet by mouth Daily with supper. 05/25/13    Fae Pippin, MD     LABS:     No results found for this basename: WBC, LABLYMP, MID, GRAN, NEUTROABS, LYMPHOPCT, GRANULOCYTES, RELATIVEPERCENT, RBC, HGB, HCT, MCV, MCH, MCHC, RDW, PLT,  in the last 720 hours    Lab Results   Component Value Date    NA 138 05/22/2013    K 4.8 05/22/2013    CL 98* 05/22/2013    CO2 28 05/22/2013    GLUCOSE 154* 05/22/2013    BUN 13 05/22/2013    CREATININE 0.8 05/22/2013    LABGLOM >60 05/22/2013    GFRAA >60 05/22/2013    CALCIUM 10.1 05/22/2013    PROT 7.6 05/22/2013    LABALBU 4.5 05/22/2013    AGRATIO 1.5 05/22/2013    BILITOT 1.1* 05/22/2013    ALKPHOS 64 05/22/2013    ALT 19 05/22/2013    AST 19 05/22/2013    GLOB 3.1 05/22/2013     Pathology:                 IMAGING:     none    ASSESSMENT AND PLAN:       1. Chemotherapy induced cardiomyopathy ([place])    2. BREAST CANCER      1. T1 N1 M0, hormone-receptor positive and HER-2/neu negative left breast cancer, diagnosed in 05/2007, status post lumpectomy with axillary sentinel lymph nodes, status post adjuvant AC followed by Taxol, status post adjuvant radiation, and status post five years of tamoxifen. We continue our letrozole which will be concluded in 05/2017. I will continue to follow her on a six-month schedule. There is no worry about relapse.   2. 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 Boyd Kerbs and associates.   3. Neuropathy - She is not deriving much benefit from Neurontin. I will taper her off of this over the next 4 weeks. She'll be back in 1 month to start Lyrica.    Electronically signed by Para March, MD on 11/20/2013 at 4:05 PM

## 2013-12-18 MED ORDER — PREGABALIN 75 MG PO CAPS
75 MG | ORAL_CAPSULE | Freq: Two times a day (BID) | ORAL | Status: DC
Start: 2013-12-18 — End: 2013-12-31

## 2013-12-18 NOTE — Progress Notes (Signed)
ONCOLOGY FOLLOW-UP:       Primary Oncologist: Para March, MD    PROBLEM LIST:       Patient Active Problem List   Diagnosis Code   ??? Cardiomyopathy 425.4   ??? Adjustment Disorder with Anxiety 309.24   ??? BREAST CANCER 174.9   ??? Unspecified Hypothyroidism 244.9   ??? Sleep Apnea 780.57   ??? Constipation, chronic 564.00   ??? 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 Encounter for long-term (current) use of other medications V58.69     1. T1 N1 M0, hormone-receptor-positive, HER-2/neu-negative left breast cancer diagnosed in 05/2007, status post left lumpectomy with axillary lymph node dissection followed by dose-dense AC, followed by dose-dense Taxol, followed by adjuvant radiation.  2. 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 Boyd Kerbs.  3.   Completion of five years of tamoxifen 20 mg daily, 05/2012.     Current Oncologic Therapy:       Letrozole 2.5 mg daily per the MA-17 data, to be complete in 05/2017.    INTERVAL HISTORY:       Her EF has improved to the 30% range.  Still having neuropathy.    Review of Systems:       Review of Systems   Constitutional: Negative.         Hot flashes   HENT: Negative.    Eyes: Negative.    Respiratory: Negative.    Cardiovascular: Negative.    Gastrointestinal: Negative.    Genitourinary: Negative.    Musculoskeletal: Negative.    Skin: Negative.    Neurological: Negative.    Endo/Heme/Allergies: Negative.      PHYSICAL EXAM:       BP 110/70    Pulse 80    Temp(Src) 98.1 ??F (36.7 ??C)    Resp 12    Ht '5\' 5"'  (1.651 m)    Wt 248 lb (112.492 kg)    BMI 41.27 kg/m2       Physical Exam   Constitutional: She is oriented to person, place, and time. She appears well-developed and well-nourished. No distress.   HENT:   Head: Normocephalic and atraumatic.   Mouth/Throat: Oropharynx is clear and moist.   Eyes: Conjunctivae are normal. No scleral icterus.    Neck: Neck supple. No JVD present.   Cardiovascular: Normal rate, regular rhythm and normal heart sounds.  Exam reveals no gallop and no friction rub.    No murmur heard.  Pulmonary/Chest: Effort normal and breath sounds normal. She has no wheezes. She has no rales.   Abdominal: Soft. Bowel sounds are normal. She exhibits no distension and no mass. There is no tenderness. There is no rebound and no guarding.   Musculoskeletal: She exhibits no edema.   Lymphadenopathy:     She has no cervical adenopathy.   Neurological: She is alert and oriented to person, place, and time.   Skin: Skin is warm and dry. No rash noted. No erythema. No pallor.   Psychiatric: She has a normal mood and affect. Her behavior is normal. Thought content normal.       Current Meds:       Prior to Admission medications    Medication Sig Start Date End Date Taking? Authorizing Provider   pregabalin (LYRICA) 75 MG capsule Take 1 capsule by mouth 2 times daily. 12/18/13  Yes Luciano Cutter Alliya Marcon, MD   levothyroxine (  SYNTHROID) 75 MCG tablet TAKE 1 TABLET DAILY 07/30/13  Yes Fae Pippin, MD   metFORMIN ER (GLUCOPHAGE XR) 500 MG XR tablet Take 1 tablet by mouth Daily with supper. 05/25/13  Yes Fae Pippin, MD   valsartan (DIOVAN) 80 MG tablet Take 80 mg by mouth daily.   Yes Historical Provider, MD   furosemide (LASIX) 40 MG tablet Take 1 tablet by mouth daily as needed for Other (weight gain > 2 pounds ). 09/22/10  Yes Saul Fordyce, NP   potassium chloride SA (KLOR-CON M20) 20 MEQ tablet Take 1 tablet by mouth daily as needed. 09/22/10  Yes Saul Fordyce, NP   spironolactone (ALDACTONE) 25 MG tablet Take 25 mg by mouth daily.   Yes Historical Provider, MD   carvedilol (COREG) 6.25 MG tablet Take 12.5 mg by mouth 2 times daily (with meals).   Yes Historical Provider, MD   Multiple Vitamin (MULTIVITAMIN PO) Take  by mouth daily. 03/16/09  Yes Historical Provider, MD   Calcium Citrate (CITRACAL PO) Take  by mouth.   Yes Historical Provider, MD    gabapentin (NEURONTIN) 300 MG capsule TAKE 1 CAPSULE THREE TIMES A DAY 11/19/13 12/18/13  Fae Pippin, MD   letrozole [facility]) 2.5 MG tablet TAKE 1 TABLET (2.5 MG) DAILY 08/26/13   Luciano Cutter Kafi Dotter, MD     LABS:     No results found for this basename: WBC, LABLYMP, MID, GRAN, NEUTROABS, LYMPHOPCT, GRANULOCYTES, RELATIVEPERCENT, RBC, HGB, HCT, MCV, MCH, MCHC, RDW, PLT,  in the last 720 hours    Lab Results   Component Value Date    NA 138 05/22/2013    K 4.8 05/22/2013    CL 98* 05/22/2013    CO2 28 05/22/2013    GLUCOSE 154* 05/22/2013    BUN 13 05/22/2013    CREATININE 0.8 05/22/2013    LABGLOM >60 05/22/2013    GFRAA >60 05/22/2013    CALCIUM 10.1 05/22/2013    PROT 7.6 05/22/2013    LABALBU 4.5 05/22/2013    AGRATIO 1.5 05/22/2013    BILITOT 1.1* 05/22/2013    ALKPHOS 64 05/22/2013    ALT 19 05/22/2013    AST 19 05/22/2013    GLOB 3.1 05/22/2013     Pathology:                 IMAGING:     none    ASSESSMENT AND PLAN:       1. BREAST CANCER    2. Cardiomyopathy    3. Encounter for long-term (current) use of other medications    4. Chemotherapy induced cardiomyopathy ([place])      1. T1 N1 M0, hormone-receptor positive and HER-2/neu negative left breast cancer, diagnosed in 05/2007, status post lumpectomy with axillary sentinel lymph nodes, status post adjuvant AC followed by Taxol, status post adjuvant radiation, and status post five years of tamoxifen. We continue our letrozole which will be concluded in 05/2017. I will continue to follow her on a six-month schedule. There is no worry about relapse.   2. 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 Boyd Kerbs and associates.   3. Neuropathy - She did not derive much benefit from Neurontin. We've tapered her off. We will start Lyrica. I'll see her in a month.    Electronically signed by Para March, MD on 12/18/2013 at 4:20 PM

## 2013-12-30 NOTE — Telephone Encounter (Signed)
Patient called saying she does not want to continue taking Lyrica. She wants to know if this is OK. Please call back. Thanks.

## 2013-12-30 NOTE — Telephone Encounter (Signed)
Left message for pt.

## 2013-12-31 MED ORDER — GABAPENTIN 300 MG PO CAPS
300 MG | ORAL_CAPSULE | ORAL | Status: DC
Start: 2013-12-31 — End: 2015-02-02

## 2013-12-31 NOTE — Telephone Encounter (Signed)
Pt needs to taper off Lyrica. Take one tablet for four days, then one tablet every other day for four doses. Pt instructed. Pt verbalized understanding. Pt wants to start gabapentin again. She still has medication. Dr. Leonides Schanz aware.

## 2013-12-31 NOTE — Telephone Encounter (Signed)
Patient returning call   Transferred  /p f

## 2014-03-25 NOTE — Telephone Encounter (Signed)
Patient was seen in office on 12/18/2013 and was to return for follow up in 4 weeks. --- at check out patient said she will call for appointment..  As of 03/25/2014 no appointment scheduled.  03/25/2014 left message for patient to call and schedule follow up appt with Dr. Leonides Schanz.    Call Center please schedule appointment for patient at earliest convenience with Dr. Leonides Schanz.... Cm

## 2014-04-13 NOTE — Telephone Encounter (Signed)
Regarding appointment soon as possible with Dr. Leonides Schanz. Call center please make appointment.

## 2014-05-19 NOTE — Telephone Encounter (Signed)
Patient called to schedule a follow up. Scheduled for 9.02.2015

## 2014-06-09 DIAGNOSIS — Z79811 Long term (current) use of aromatase inhibitors: Secondary | ICD-10-CM | POA: Insufficient documentation

## 2014-06-09 DIAGNOSIS — T451X5A Adverse effect of antineoplastic and immunosuppressive drugs, initial encounter: Secondary | ICD-10-CM | POA: Insufficient documentation

## 2014-06-09 NOTE — Progress Notes (Signed)
ONCOLOGY FOLLOW-UP:       Primary Oncologist: Luciano Cutter Fortino Haag, MD    PROBLEM LIST:       Patient Active Problem List   Diagnosis Code   ??? Cardiomyopathy 425.4   ??? Adjustment Disorder with Anxiety 309.24   ??? Malignant neoplasm of central portion of left female breast (HCC) 174.1   ??? Unspecified Hypothyroidism 244.9   ??? Sleep Apnea 780.57   ??? Constipation, chronic 564.00   ??? 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 Encounter for long-term (current) use of other medications V58.69   ??? Use of aromatase inhibitors V07.52     1. T1 N1 M0, hormone-receptor-positive, HER-2/neu-negative left breast cancer diagnosed in 05/2007, status post left lumpectomy with axillary lymph node dissection followed by dose-dense AC, followed by dose-dense Taxol, followed by adjuvant radiation.  2. 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 Boyd Kerbs.  3.   Completion of five years of tamoxifen 20 mg daily, 05/2012.     Current Oncologic Therapy:       Letrozole 2.5 mg daily per the MA-17 data, to be complete in 05/2017.    INTERVAL HISTORY:       Her EF has improved to the 30% range.  Manageable neuropathy.    Review of Systems:       Review of Systems   Constitutional: Negative.         Hot flashes   HENT: Negative.    Eyes: Negative.    Respiratory: Negative.    Cardiovascular: Negative.    Gastrointestinal: Negative.    Genitourinary: Negative.    Musculoskeletal: Negative.    Skin: Negative.    Neurological: Negative.    Endo/Heme/Allergies: Negative.      PHYSICAL EXAM:       BP 120/80 mmHg   Pulse 80   Temp(Src) 98 ??F (36.7 ??C) (Oral)   Resp 12   Ht '5\' 5"'  (1.651 m)   Wt 244 lb (110.678 kg)   BMI 40.60 kg/m2    Physical Exam   Constitutional: She is oriented to person, place, and time. She appears well-developed and well-nourished. No distress.   HENT:   Head: Normocephalic and atraumatic.    Mouth/Throat: Oropharynx is clear and moist.   Eyes: Conjunctivae are normal. No scleral icterus.   Neck: Neck supple. No JVD present.   Cardiovascular: Normal rate, regular rhythm and normal heart sounds.  Exam reveals no gallop and no friction rub.    No murmur heard.  Pulmonary/Chest: Effort normal and breath sounds normal. She has no wheezes. She has no rales.   Abdominal: Soft. Bowel sounds are normal. She exhibits no distension and no mass. There is no tenderness. There is no rebound and no guarding.   Musculoskeletal: She exhibits no edema.   Lymphadenopathy:     She has no cervical adenopathy.   Neurological: She is alert and oriented to person, place, and time.   Skin: Skin is warm and dry. No rash noted. No erythema. No pallor.   Psychiatric: She has a normal mood and affect. Her behavior is normal. Thought content normal.       Current Meds:       Prior to Admission medications    Medication Sig Start Date End Date Taking? Authorizing Provider   gabapentin (NEURONTIN) 300 MG capsule TAKE 1 CAPSULE THREE TIMES A DAY 12/31/13  Luciano Cutter Chad Donoghue, MD   letrozole [facility]) 2.5 MG tablet TAKE 1 TABLET (2.5 MG) DAILY 08/26/13   Luciano Cutter Kennethia Lynes, MD   levothyroxine (SYNTHROID) 75 MCG tablet TAKE 1 TABLET DAILY 07/30/13   Fae Pippin, MD   metFORMIN ER (GLUCOPHAGE XR) 500 MG XR tablet Take 1 tablet by mouth Daily with supper. 05/25/13   Fae Pippin, MD   valsartan (DIOVAN) 80 MG tablet Take 80 mg by mouth daily.    Historical Provider, MD   furosemide (LASIX) 40 MG tablet Take 1 tablet by mouth daily as needed for Other (weight gain > 2 pounds ). 09/22/10   Saul Fordyce, NP   potassium chloride SA (KLOR-CON M20) 20 MEQ tablet Take 1 tablet by mouth daily as needed. 09/22/10   Saul Fordyce, NP   spironolactone (ALDACTONE) 25 MG tablet Take 25 mg by mouth daily.    Historical Provider, MD   carvedilol (COREG) 6.25 MG tablet Take 12.5 mg by mouth 2 times daily (with meals).    Historical Provider, MD   Multiple  Vitamin (MULTIVITAMIN PO) Take  by mouth daily. 03/16/09   Historical Provider, MD   Calcium Citrate (CITRACAL PO) Take  by mouth.    Historical Provider, MD     LABS:       Lab Results   Component Value Date    NA 138 05/22/2013    K 4.8 05/22/2013    CL 98* 05/22/2013    CO2 28 05/22/2013    GLUCOSE 154* 05/22/2013    BUN 13 05/22/2013    CREATININE 0.8 05/22/2013    LABGLOM >60 05/22/2013    GFRAA >60 05/22/2013    CALCIUM 10.1 05/22/2013    PROT 7.6 05/22/2013    LABALBU 4.5 05/22/2013    AGRATIO 1.5 05/22/2013    BILITOT 1.1* 05/22/2013    ALKPHOS 64 05/22/2013    ALT 19 05/22/2013    AST 19 05/22/2013    GLOB 3.1 05/22/2013     Pathology:                 IMAGING:     none    ASSESSMENT AND PLAN:       1. Malignant neoplasm of central portion of left female breast ([place])    2. Encounter for long-term (current) use of other medications    3. Chemotherapy induced cardiomyopathy ([place])    4. Use of aromatase inhibitors      1. T1 N1 M0, hormone-receptor positive and HER-2/neu negative left breast cancer, diagnosed in 05/2007, status post lumpectomy with axillary sentinel lymph nodes, status post adjuvant AC followed by Taxol, status post adjuvant radiation, and status post five years of tamoxifen. We continue our letrozole which will be concluded in 05/2017. I will continue to follow her on a six-month schedule. There is no worry about relapse.   2. 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 Boyd Kerbs and associates.   3. Neuropathy - She did not derive much benefit from Neurontin. We've tapered her off. She also didn't tolerate Lyrica so she tapered off of that as well. Currently symptoms are tolerable.    Electronically signed by Luciano Cutter Romell Wolden, MD on 06/09/2014 at 4:28 PM

## 2014-07-12 MED ORDER — LETROZOLE 2.5 MG PO TABS
2.5 MG | ORAL_TABLET | ORAL | Status: DC
Start: 2014-07-12 — End: 2015-03-02

## 2014-12-08 ENCOUNTER — Ambulatory Visit: Admit: 2014-12-08 | Discharge: 2014-12-08 | Payer: BLUE CROSS/BLUE SHIELD | Attending: Hematology & Oncology

## 2014-12-08 DIAGNOSIS — C50112 Malignant neoplasm of central portion of left female breast: Secondary | ICD-10-CM

## 2014-12-08 NOTE — Progress Notes (Signed)
ONCOLOGY FOLLOW-UP:       Primary Oncologist: Luciano Cutter Roshawn Lacina, MD    PROBLEM LIST:       Patient Active Problem List   Diagnosis Code   ??? Cardiomyopathy I42.8   ??? Adjustment Disorder with Anxiety F43.22   ??? Malignant neoplasm of central portion of left female breast ([place]) C50.112   ??? Hypothyroidism E03.9   ??? Sleep Apnea G47.30   ??? Constipation, chronic K59.00   ??? 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 Use of aromatase inhibitors Z79.811   ??? 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 Left hip pain M25.552     1. T1 N1 M0, hormone-receptor-positive, HER-2/neu-negative left breast cancer diagnosed in 05/2007, status post left lumpectomy with axillary lymph node dissection followed by dose-dense AC, followed by dose-dense Taxol, followed by adjuvant radiation.  2. 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 Boyd Kerbs.  3.   Completion of five years of tamoxifen 20 mg daily, 05/2012.     Current Oncologic Therapy:       Letrozole 2.5 mg daily per the MA-17 data, to be complete in 05/2017.    INTERVAL HISTORY:       Her EF has improved to the 30% range. No symptoms of CHF.  Manageable neuropathy.    She has been experiencing left hip pain - worse with use. Ongoing for about 3-4 months    Review of Systems:       Review of Systems   Constitutional: Negative.         Hot flashes   HENT: Negative.    Eyes: Negative.    Respiratory: Negative.    Cardiovascular: Negative.    Gastrointestinal: Negative.    Genitourinary: Negative.    Musculoskeletal: Positive for joint pain (L Hip).   Skin: Negative.    Neurological: Negative.    Endo/Heme/Allergies: Negative.      PHYSICAL EXAM:       BP 90/60 mmHg   Pulse 84   Temp(Src) 98.2 ??F (36.8 ??C)   Resp 12   Ht '5\' 5"'  (1.651 m)   Wt 241 lb 9.6 oz (109.589 kg)   BMI 40.20 kg/m2    Physical Exam   Constitutional: She is  oriented to person, place, and time. She appears well-developed and well-nourished. No distress.   HENT:   Head: Normocephalic and atraumatic.   Mouth/Throat: Oropharynx is clear and moist.   Eyes: Conjunctivae are normal. No scleral icterus.   Neck: Neck supple. No JVD present.   Cardiovascular: Normal rate, regular rhythm and normal heart sounds.  Exam reveals no gallop and no friction rub.    No murmur heard.  Pulmonary/Chest: Effort normal and breath sounds normal. She has no wheezes. She has no rales.   Abdominal: Soft. Bowel sounds are normal. She exhibits no distension and no mass. There is no tenderness. There is no rebound and no guarding.   Musculoskeletal: She exhibits no edema.   Lymphadenopathy:     She has no cervical adenopathy.   Neurological: She is alert and oriented to person, place, and time.   Skin: Skin is warm and dry. No rash noted. No erythema. No pallor.   Psychiatric: She has a normal mood and affect. Her behavior is normal. Thought content normal.       Current Meds:  Prior to Admission medications    Medication Sig Start Date End Date Taking? Authorizing Provider   Pyridoxine HCl (VITAMIN B-6) 50 MG tablet Take 50 mg by mouth daily   Yes Historical Provider, MD   letrozole (FEMARA) 2.5 MG tablet TAKE 1 TABLET DAILY 07/12/14  Yes Luciano Cutter Haruka Kowaleski, MD   gabapentin (NEURONTIN) 300 MG capsule TAKE 1 CAPSULE THREE TIMES A DAY 12/31/13  Yes Luciano Cutter Rc Amison, MD   levothyroxine (SYNTHROID) 75 MCG tablet TAKE 1 TABLET DAILY 07/30/13  Yes Fae Pippin, MD   metFORMIN ER (GLUCOPHAGE XR) 500 MG XR tablet Take 1 tablet by mouth Daily with supper. 05/25/13  Yes Fae Pippin, MD   valsartan (DIOVAN) 80 MG tablet Take 80 mg by mouth daily.   Yes Historical Provider, MD   furosemide (LASIX) 40 MG tablet Take 1 tablet by mouth daily as needed for Other (weight gain > 2 pounds ). 09/22/10  Yes Saul Fordyce, NP   potassium chloride SA (KLOR-CON M20) 20 MEQ tablet Take 1 tablet by mouth daily as needed.  09/22/10  Yes Saul Fordyce, NP   spironolactone (ALDACTONE) 25 MG tablet Take 25 mg by mouth daily.   Yes Historical Provider, MD   carvedilol (COREG) 6.25 MG tablet Take 12.5 mg by mouth 2 times daily (with meals).   Yes Historical Provider, MD   Multiple Vitamin (MULTIVITAMIN PO) Take  by mouth daily. 03/16/09  Yes Historical Provider, MD   Calcium Citrate (CITRACAL PO) Take  by mouth.   Yes Historical Provider, MD     LABS:       Lab Results   Component Value Date    NA 138 05/22/2013    K 4.8 05/22/2013    CL 98* 05/22/2013    CO2 28 05/22/2013    GLUCOSE 154* 05/22/2013    BUN 13 05/22/2013    CREATININE 0.8 05/22/2013    LABGLOM >60 05/22/2013    GFRAA >60 05/22/2013    CALCIUM 10.1 05/22/2013    PROT 7.6 05/22/2013    LABALBU 4.5 05/22/2013    AGRATIO 1.5 05/22/2013    BILITOT 1.1* 05/22/2013    ALKPHOS 64 05/22/2013    ALT 19 05/22/2013    AST 19 05/22/2013    GLOB 3.1 05/22/2013     Pathology:                 IMAGING:     none    ASSESSMENT AND PLAN:       1. Malignant neoplasm of central portion of left female breast ([place])    2. Left hip pain    3. Use of aromatase inhibitors    4. Neuropathy due to chemotherapeutic drug ([place])    5. Chemotherapy induced cardiomyopathy ([place])      1. T1 N1 M0, hormone-receptor positive and HER-2/neu negative left breast cancer, diagnosed in 05/2007, status post lumpectomy with axillary sentinel lymph nodes, status post adjuvant AC followed by Taxol, status post adjuvant radiation, and status post five years of tamoxifen. We continue our letrozole which will be concluded in 05/2017. I will continue to follow her on a six-month schedule  2. With regard to the hip pain, will request a bone scan to R/O bone mets.   3. 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 Boyd Kerbs and associates.   4. Neuropathy - She did not derive much benefit from Neurontin. We've tapered  her off.  She also didn't tolerate Lyrica so she tapered off of that as well. Currently symptoms are tolerable.    Electronically signed by Luciano Cutter Anahita Cua, MD on 12/08/2014 at 4:22 PM

## 2014-12-10 NOTE — Telephone Encounter (Signed)
I left message for patient to call back to schedule NM bone scan. Need scheduling preferences.

## 2014-12-13 NOTE — Telephone Encounter (Signed)
I spoke with patient and gave all test information. She verbalized understanding.     [facility] DIAGNOSTIC TEST INFORMATION    Patient Name: Jillian Warren   DOB:  [birth year]  Test:  NM bone scan   Test Date: 12/16/14  Test Time:  2:45pm  Test Location:   [facility]   Arrival Time:  11:30am ( injection )  Ordering Provider: Para March MD     Nothing to eat or drink after: n/a    Special Instructions:   n/a    Take a complete list of medications, insurance cards and photo ID.    If you have any questions about your upcoming test, please give [place] a call at [phone] and ask for test scheduling.

## 2014-12-13 NOTE — Telephone Encounter (Signed)
Jillian Warren is returning a phone call. Call transferred to test scheduling.

## 2014-12-21 ENCOUNTER — Ambulatory Visit: Admit: 2014-12-21 | Discharge: 2014-12-21 | Payer: BLUE CROSS/BLUE SHIELD | Attending: Hematology & Oncology

## 2014-12-21 DIAGNOSIS — Z79811 Long term (current) use of aromatase inhibitors: Secondary | ICD-10-CM

## 2014-12-21 NOTE — Progress Notes (Signed)
ONCOLOGY FOLLOW-UP:       Primary Oncologist: Luciano Cutter Siddhi Dornbush, MD    PROBLEM LIST:       Patient Active Problem List   Diagnosis Code   ??? Cardiomyopathy I42.8   ??? Adjustment Disorder with Anxiety F43.22   ??? Malignant neoplasm of central portion of left female breast ([place]) C50.112   ??? Hypothyroidism E03.9   ??? Sleep Apnea G47.30   ??? Constipation, chronic K59.00   ??? 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 Use of aromatase inhibitors Z79.811   ??? 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 Left hip pain M25.552     1. T1 N1 M0, hormone-receptor-positive, HER-2/neu-negative left breast cancer diagnosed in 05/2007, status post left lumpectomy with axillary lymph node dissection followed by dose-dense AC, followed by dose-dense Taxol, followed by adjuvant radiation.  2. 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 Boyd Kerbs.  3.   Completion of five years of tamoxifen 20 mg daily, 05/2012.     Current Oncologic Therapy:       Letrozole 2.5 mg daily per the MA-17 data, to be complete in 05/2017.    INTERVAL HISTORY:       Her EF has improved to the 30% range. No symptoms of CHF.  Manageable neuropathy.    She has been experiencing left hip pain - worse with use. Ongoing for about 3-4 months  Bone scan was negative    Review of Systems:       Review of Systems   Constitutional: Negative.         Hot flashes   HENT: Negative.    Eyes: Negative.    Respiratory: Negative.    Cardiovascular: Negative.    Gastrointestinal: Negative.    Genitourinary: Negative.    Musculoskeletal: Positive for joint pain (L Hip).   Skin: Negative.    Neurological: Negative.    Endo/Heme/Allergies: Negative.      PHYSICAL EXAM:       BP 102/60 mmHg   Pulse 80   Temp(Src) 98 ??F (36.7 ??C)   Resp 12   Ht '5\' 5"'  (1.651 m)   Wt 241 lb 9.6 oz (109.589 kg)   BMI 40.20 kg/m2    Physical Exam    Constitutional: She is oriented to person, place, and time. She appears well-developed and well-nourished. No distress.   HENT:   Head: Normocephalic and atraumatic.   Mouth/Throat: Oropharynx is clear and moist.   Eyes: Conjunctivae are normal. No scleral icterus.   Neck: Neck supple. No JVD present.   Cardiovascular: Normal rate, regular rhythm and normal heart sounds.  Exam reveals no gallop and no friction rub.    No murmur heard.  Pulmonary/Chest: Effort normal and breath sounds normal. She has no wheezes. She has no rales.   Abdominal: Soft. Bowel sounds are normal. She exhibits no distension and no mass. There is no tenderness. There is no rebound and no guarding.   Musculoskeletal: She exhibits no edema.   Lymphadenopathy:     She has no cervical adenopathy.   Neurological: She is alert and oriented to person, place, and time.   Skin: Skin is warm and dry. No rash noted. No erythema. No pallor.   Psychiatric: She has a normal mood and affect. Her behavior is normal. Thought content normal.       Current Meds:  Prior to Admission medications    Medication Sig Start Date End Date Taking? Authorizing Provider   Pyridoxine HCl (VITAMIN B-6) 50 MG tablet Take 100 mg by mouth daily    Yes Historical Provider, MD   letrozole (FEMARA) 2.5 MG tablet TAKE 1 TABLET DAILY 07/12/14  Yes Luciano Cutter Eara Burruel, MD   gabapentin (NEURONTIN) 300 MG capsule TAKE 1 CAPSULE THREE TIMES A DAY 12/31/13  Yes Luciano Cutter Aman Batley, MD   levothyroxine (SYNTHROID) 75 MCG tablet TAKE 1 TABLET DAILY 07/30/13  Yes Fae Pippin, MD   metFORMIN ER (GLUCOPHAGE XR) 500 MG XR tablet Take 1 tablet by mouth Daily with supper. 05/25/13  Yes Fae Pippin, MD   valsartan (DIOVAN) 80 MG tablet Take 80 mg by mouth daily.   Yes Historical Provider, MD   furosemide (LASIX) 40 MG tablet Take 1 tablet by mouth daily as needed for Other (weight gain > 2 pounds ). 09/22/10  Yes Saul Fordyce, NP   potassium chloride SA (KLOR-CON M20) 20 MEQ tablet Take 1 tablet  by mouth daily as needed. 09/22/10  Yes Saul Fordyce, NP   spironolactone (ALDACTONE) 25 MG tablet Take 25 mg by mouth daily.   Yes Historical Provider, MD   carvedilol (COREG) 6.25 MG tablet Take 12.5 mg by mouth 2 times daily (with meals).   Yes Historical Provider, MD   Multiple Vitamin (MULTIVITAMIN PO) Take  by mouth daily. 03/16/09  Yes Historical Provider, MD   Calcium Citrate (CITRACAL PO) Take  by mouth.   Yes Historical Provider, MD     LABS:       Lab Results   Component Value Date    NA 138 05/22/2013    K 4.8 05/22/2013    CL 98* 05/22/2013    CO2 28 05/22/2013    GLUCOSE 154* 05/22/2013    BUN 13 05/22/2013    CREATININE 0.8 05/22/2013    LABGLOM >60 05/22/2013    GFRAA >60 05/22/2013    CALCIUM 10.1 05/22/2013    PROT 7.6 05/22/2013    LABALBU 4.5 05/22/2013    AGRATIO 1.5 05/22/2013    BILITOT 1.1* 05/22/2013    ALKPHOS 64 05/22/2013    ALT 19 05/22/2013    AST 19 05/22/2013    GLOB 3.1 05/22/2013     Pathology:                 IMAGING:     Nm Bone Scan Whole Body    12/16/2014   Impression/Conclusion below  NUCLEAR MEDICINE WHOLE BODY BONE SCAN DATED December 16, 2014:  CLINICAL INDICATION: Breast cancer  COMPARISON: CT scan abdomen pelvis dated January 01, 2009  TECHNIQUE:  Following the intravenous administration of technetium-4mmethylene diphosphonate  (MDP), whole body images of the skeletal system were obtained. Regional clinically directed  views were also obtained.  DOSE:  216.6millicuries Technetium 955mDP  FINDINGS: Whole-body images demonstrate bilateral renal, and bladder activity.  Increased  activity seen in the [facility] of the [facility], knees bilaterally, consistent degenerative  change.  Minimally increased activity seen within the mid thoracic spine, mid lumbar region, likely degenerative in nature.  No abnormal activity seen in the region of  the left hip, to correlate with the clinical symptoms of left hip pain.  COMMENT: Please note that this record was generated using voice  recognition software. If there are any  questions about the content of this document, please contact the radiologist at [phone]  some errors in  transcription may have occurred.  26  IMPRESSION:  1.   No abnormality seen involving the left hip to correlate with the clinical symptoms. 2.  Degenerative type pattern of activity seen involving the AC joints, knees, mid thoracic and  lumbar spine.  SIGNED BY: Cyndy Freeze, DO on 12/16/2014  4:40 PM      ASSESSMENT AND PLAN:       1. Use of aromatase inhibitors    2. Malignant neoplasm of central portion of left female breast ([place])    3. Chemotherapy induced cardiomyopathy ([place])    4. Left hip pain      1. T1 N1 M0, hormone-receptor positive and HER-2/neu negative left breast cancer, diagnosed in 05/2007, status post lumpectomy with axillary sentinel lymph nodes, status post adjuvant AC followed by Taxol, status post adjuvant radiation, and status post five years of tamoxifen. We continue our letrozole which will be concluded in 05/2017. I will continue to follow her on a six-month schedule  2. With regard to the hip pain, bone scan negative. Likely muscular.   3. 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 Boyd Kerbs and associates.   4. Neuropathy - She did not derive much benefit from Neurontin. We've tapered her off. She also didn't tolerate Lyrica so she tapered off of that as well. Currently symptoms are tolerable.    Electronically signed by Luciano Cutter Huxley Vanwagoner, MD on 12/21/2014 at 4:43 PM

## 2015-02-02 ENCOUNTER — Telehealth

## 2015-02-02 MED ORDER — GABAPENTIN 300 MG PO CAPS
300 MG | ORAL_CAPSULE | ORAL | Status: DC
Start: 2015-02-02 — End: 2015-07-13

## 2015-02-02 NOTE — Telephone Encounter (Signed)
LF 12/31/13  LA 12/21/14  NA 07/08/15

## 2015-02-02 NOTE — Telephone Encounter (Signed)
Patient called stating they were returning the nurses phone call. Call was transferred to nurse

## 2015-02-02 NOTE — Telephone Encounter (Signed)
Patient called requesting refill on Gaabpentin 300 mg. Requesting to pick up at [facility] office. ADDL details- Mail order.

## 2015-02-02 NOTE — Telephone Encounter (Signed)
Explained our records show she went off of the gabapentin and lyrica, with last fill in march of 2015. Pt states she went off of gabapentin and switched to lyrica. Lyrica did not work so she went back on gabapentin. She has made her gabapentin last because she takes 2 a day instead of 3.     Ok to refill per Dr. Leonides Schanz. Sent to [facility] per pt.

## 2015-02-02 NOTE — Telephone Encounter (Signed)
Left message for pt. We had tapered her off of gabapentin and lyrica because she did not tolerate either. Need clarification as to the return of her symptoms.

## 2015-03-02 MED ORDER — LETROZOLE 2.5 MG PO TABS
2.5 MG | ORAL_TABLET | ORAL | Status: DC
Start: 2015-03-02 — End: 2015-08-28

## 2015-07-08 ENCOUNTER — Ambulatory Visit: Admit: 2015-07-08 | Discharge: 2015-07-08 | Payer: BLUE CROSS/BLUE SHIELD | Attending: Hematology & Oncology

## 2015-07-08 DIAGNOSIS — C50112 Malignant neoplasm of central portion of left female breast: Secondary | ICD-10-CM

## 2015-07-08 NOTE — Progress Notes (Signed)
ONCOLOGY FOLLOW-UP:       Primary Oncologist: Luciano Cutter Natalyia Innes, MD    PROBLEM LIST:       Patient Active Problem List   Diagnosis Code   ??? Cardiomyopathy I42.8   ??? Adjustment disorder with anxiety F43.22   ??? Malignant neoplasm of central portion of left female breast ([place]) C50.112   ??? Hypothyroidism E03.9   ??? Sleep apnea G47.30   ??? Constipation, chronic K59.09   ??? 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 Use of aromatase inhibitors Z79.811   ??? 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 Left hip pain M25.552     1. T1 N1 M0, hormone-receptor-positive, HER-2/neu-negative left breast cancer diagnosed in 05/2007, status post left lumpectomy with axillary lymph node dissection followed by dose-dense AC, followed by dose-dense Taxol, followed by adjuvant radiation.  2. 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 Boyd Kerbs.  3.   Completion of five years of tamoxifen 20 mg daily, 05/2012.     Current Oncologic Therapy:       Letrozole 2.5 mg daily per the MA-17R data, to be complete in 05/2022.    INTERVAL HISTORY:       Her EF has improved to the 30% range. No symptoms of CHF.  Manageable neuropathy.    She has been experiencing left hip pain - worse with use. Ongoing for about 3-4 months  Bone scan was negative    Review of Systems:       Review of Systems   Constitutional: Negative.         Hot flashes   HENT: Negative.    Eyes: Negative.    Respiratory: Negative.    Cardiovascular: Negative.    Gastrointestinal: Negative.    Genitourinary: Negative.    Musculoskeletal: Positive for joint pain (L Hip).   Skin: Negative.    Neurological: Negative.    Endo/Heme/Allergies: Negative.      PHYSICAL EXAM:       Visit Vitals   ??? BP 102/62 (Site: Right Arm, Position: Sitting, Cuff Size: Medium Adult)   ??? Pulse 80   ??? Temp 98.4 ??F (36.9 ??C) (Oral)   ??? Wt 243 lb (110.2  kg)   ??? BMI 40.44 kg/m2       Physical Exam   Constitutional: She is oriented to person, place, and time. She appears well-developed and well-nourished. No distress.   HENT:   Head: Normocephalic and atraumatic.   Mouth/Throat: Oropharynx is clear and moist.   Eyes: Conjunctivae are normal. No scleral icterus.   Neck: Neck supple. No JVD present.   Cardiovascular: Normal rate, regular rhythm and normal heart sounds.  Exam reveals no gallop and no friction rub.    No murmur heard.  Pulmonary/Chest: Effort normal and breath sounds normal. She has no wheezes. She has no rales.   Abdominal: Soft. Bowel sounds are normal. She exhibits no distension and no mass. There is no tenderness. There is no rebound and no guarding.   Musculoskeletal: She exhibits no edema.   Lymphadenopathy:     She has no cervical adenopathy.   Neurological: She is alert and oriented to person, place, and time.   Skin: Skin is warm and dry. No rash noted. No erythema. No pallor.   Psychiatric: She has a normal mood and affect. Her behavior is normal. Thought content normal.  Current Meds:       Prior to Admission medications    Medication Sig Start Date End Date Taking? Authorizing Provider   letrozole [facility]) 2.5 MG tablet TAKE 1 TABLET DAILY 03/02/15  Yes Luciano Cutter Elih Mooney, MD   gabapentin (NEURONTIN) 300 MG capsule TAKE 1 CAPSULE THREE TIMES A DAY 02/02/15  Yes Luciano Cutter Carlito Bogert, MD   Pyridoxine HCl (VITAMIN B-6) 50 MG tablet Take 100 mg by mouth daily    Yes Historical Provider, MD   levothyroxine (SYNTHROID) 75 MCG tablet TAKE 1 TABLET DAILY 07/30/13  Yes Fae Pippin, MD   metFORMIN ER (GLUCOPHAGE XR) 500 MG XR tablet Take 1 tablet by mouth Daily with supper. 05/25/13  Yes Fae Pippin, MD   valsartan (DIOVAN) 80 MG tablet Take 80 mg by mouth daily.   Yes Historical Provider, MD   furosemide (LASIX) 40 MG tablet Take 1 tablet by mouth daily as needed for Other (weight gain > 2 pounds ). 09/22/10  Yes Saul Fordyce, NP   potassium chloride  SA (KLOR-CON M20) 20 MEQ tablet Take 1 tablet by mouth daily as needed. 09/22/10  Yes Saul Fordyce, NP   spironolactone (ALDACTONE) 25 MG tablet Take 25 mg by mouth daily.   Yes Historical Provider, MD   carvedilol (COREG) 6.25 MG tablet Take 12.5 mg by mouth 2 times daily (with meals).   Yes Historical Provider, MD   Multiple Vitamin (MULTIVITAMIN PO) Take  by mouth daily. 03/16/09  Yes Historical Provider, MD   Calcium Citrate (CITRACAL PO) Take  by mouth.   Yes Historical Provider, MD     LABS:       Lab Results   Component Value Date    NA 138 05/22/2013    K 4.8 05/22/2013    CL 98 (L) 05/22/2013    CO2 28 05/22/2013    GLUCOSE 154 (H) 05/22/2013    BUN 13 05/22/2013    CREATININE 0.8 05/22/2013    LABGLOM >60 05/22/2013    GFRAA >60 05/22/2013    CALCIUM 10.1 05/22/2013    PROT 7.6 05/22/2013    LABALBU 4.5 05/22/2013    AGRATIO 1.5 05/22/2013    BILITOT 1.1 (H) 05/22/2013    ALKPHOS 64 05/22/2013    ALT 19 05/22/2013    AST 19 05/22/2013    GLOB 3.1 05/22/2013     Pathology:                 IMAGING:     Nm Bone Scan Whole Body    12/16/2014   Impression/Conclusion below  NUCLEAR MEDICINE WHOLE BODY BONE SCAN DATED December 16, 2014:  CLINICAL INDICATION: Breast cancer  COMPARISON: CT scan abdomen pelvis dated January 01, 2009  TECHNIQUE:  Following the intravenous administration of technetium-78mmethylene diphosphonate  (MDP), whole body images of the skeletal system were obtained. Regional clinically directed  views were also obtained.  DOSE:  216.0millicuries Technetium 954mDP  FINDINGS: Whole-body images demonstrate bilateral renal, and bladder activity.  Increased  activity seen in the region of the [facility], knees bilaterally, consistent degenerative  change.  Minimally increased activity seen within the mid thoracic spine, mid lumbar region, likely degenerative in nature.  No abnormal activity seen in the region of  the left hip, to correlate with the clinical symptoms of left hip pain.  COMMENT: Please  note that this record was generated using voice recognition software. If there are any  questions about the content of this document,  please contact the radiologist at [phone] as  some errors in transcription may have occurred.  26  IMPRESSION:  1.   No abnormality seen involving the left hip to correlate with the clinical symptoms. 2.  Degenerative type pattern of activity seen involving the AC joints, knees, mid thoracic and  lumbar spine.  SIGNED BY: Cyndy Freeze, DO on 12/16/2014  4:40 PM      ASSESSMENT AND PLAN:       1. Malignant neoplasm of central portion of left female breast ([place])    2. Chemotherapy induced cardiomyopathy ([place])    3. Use of aromatase inhibitors      1. T1 N1 M0, hormone-receptor positive and HER-2/neu negative left breast cancer, diagnosed in 05/2007, status post lumpectomy with axillary sentinel lymph nodes, status post adjuvant AC followed by Taxol, status post adjuvant radiation, and status post five years of tamoxifen. We continue our letrozole which will be concluded in 05/2017. I will continue to follow her on a six-month schedule  2. With regard to the hip pain, bone scan negative. Likely muscular.   3. 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 Boyd Kerbs and associates.  4. Neuropathy - She did not derive much benefit from Neurontin. We've tapered her off. She also didn't tolerate Lyrica so she tapered off of that as well. Currently symptoms are tolerable.    Electronically signed by Luciano Cutter Nicodemus Denk, MD on 07/08/2015 at 3:35 PM

## 2015-07-13 ENCOUNTER — Encounter

## 2015-07-13 MED ORDER — GABAPENTIN 300 MG PO CAPS
300 MG | ORAL_CAPSULE | ORAL | 0 refills | Status: AC
Start: 2015-07-13 — End: ?

## 2015-08-29 MED ORDER — LETROZOLE 2.5 MG PO TABS
2.5 MG | ORAL_TABLET | ORAL | 0 refills | Status: DC
Start: 2015-08-29 — End: 2015-11-25

## 2015-11-25 MED ORDER — LETROZOLE 2.5 MG PO TABS
2.5 MG | ORAL_TABLET | ORAL | 0 refills | Status: DC
Start: 2015-11-25 — End: 2015-12-20

## 2015-12-20 ENCOUNTER — Ambulatory Visit: Admit: 2015-12-20 | Discharge: 2015-12-20 | Payer: BLUE CROSS/BLUE SHIELD | Attending: Hematology & Oncology

## 2015-12-20 DIAGNOSIS — C50112 Malignant neoplasm of central portion of left female breast: Secondary | ICD-10-CM

## 2015-12-20 MED ORDER — LETROZOLE 2.5 MG PO TABS
2.5 MG | ORAL_TABLET | Freq: Every day | ORAL | 3 refills | Status: AC
Start: 2015-12-20 — End: ?

## 2015-12-20 NOTE — Progress Notes (Signed)
ONCOLOGY FOLLOW-UP:       Primary Oncologist: Luciano Cutter Marlee Armenteros, MD    PROBLEM LIST:       1. Malignant neoplasm of central portion of left female breast ([place])     2. Use of aromatase inhibitors     3. Chemotherapy induced cardiomyopathy ([place])       1. T1 N1 M0, hormone-receptor-positive, HER-2/neu-negative left breast cancer diagnosed in 05/2007, status post left lumpectomy with axillary lymph node dissection followed by dose-dense AC, followed by dose-dense Taxol, followed by adjuvant radiation.  2. 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 Boyd Kerbs.  3.   Completion of five years of tamoxifen 20 mg daily, 05/2012.     Current Oncologic Therapy:       Letrozole 2.5 mg daily per the MA-17R data, to be complete in 05/2022.    INTERVAL HISTORY:       Her EF has improved to the 35% range. No symptoms of CHF.  Manageable neuropathy.    Review of Systems:       Review of Systems   Constitutional: Negative.         Hot flashes   HENT: Negative.    Eyes: Negative.    Respiratory: Negative.    Cardiovascular: Negative.    Gastrointestinal: Negative.    Genitourinary: Negative.    Musculoskeletal: Positive for joint pain (L Hip).   Skin: Negative.    Neurological: Negative.    Endo/Heme/Allergies: Negative.      PHYSICAL EXAM:       Visit Vitals   ??? BP 118/72 (Site: Right Arm, Position: Sitting, Cuff Size: Medium Adult)   ??? Pulse 72   ??? Temp 98.2 ??F (36.8 ??C) (Oral)   ??? Resp 12   ??? Ht '5\' 5"'  (1.651 m)   ??? Wt 246 lb (111.6 kg)   ??? BMI 40.94 kg/m2       Physical Exam   Constitutional: She is oriented to person, place, and time. She appears well-developed and well-nourished. No distress.   HENT:   Head: Normocephalic and atraumatic.   Mouth/Throat: Oropharynx is clear and moist.   Eyes: Conjunctivae are normal. No scleral icterus.   Neck: Neck supple. No JVD present.   Cardiovascular: Normal rate, regular  rhythm and normal heart sounds.  Exam reveals no gallop and no friction rub.    No murmur heard.  Pulmonary/Chest: Effort normal and breath sounds normal. She has no wheezes. She has no rales.   Abdominal: Soft. Bowel sounds are normal. She exhibits no distension and no mass. There is no tenderness. There is no rebound and no guarding.   Musculoskeletal: She exhibits no edema.   Lymphadenopathy:     She has no cervical adenopathy.   Neurological: She is alert and oriented to person, place, and time.   Skin: Skin is warm and dry. No rash noted. No erythema. No pallor.   Psychiatric: She has a normal mood and affect. Her behavior is normal. Thought content normal.       Current Meds:       Prior to Admission medications    Medication Sig Start Date End Date Taking? Authorizing Provider   letrozole [facility]) 2.5 MG tablet Take 1 tablet by mouth daily 12/20/15  Yes Luciano Cutter Raijon Lindfors, MD   gabapentin (NEURONTIN) 300 MG capsule TAKE 1 CAPSULE THREE TIMES A DAY 07/13/15  Yes  Luciano Cutter Shamiracle Gorden, MD   Pyridoxine HCl (VITAMIN B-6) 50 MG tablet Take 100 mg by mouth daily    Yes Historical Provider, MD   levothyroxine (SYNTHROID) 75 MCG tablet TAKE 1 TABLET DAILY 07/30/13  Yes Fae Pippin, MD   metFORMIN ER (GLUCOPHAGE XR) 500 MG XR tablet Take 1 tablet by mouth Daily with supper. 05/25/13  Yes Fae Pippin, MD   valsartan (DIOVAN) 80 MG tablet Take 80 mg by mouth daily.   Yes Historical Provider, MD   furosemide (LASIX) 40 MG tablet Take 1 tablet by mouth daily as needed for Other (weight gain > 2 pounds ). 09/22/10  Yes Saul Fordyce, NP   potassium chloride SA (KLOR-CON M20) 20 MEQ tablet Take 1 tablet by mouth daily as needed. 09/22/10  Yes Saul Fordyce, NP   spironolactone (ALDACTONE) 25 MG tablet Take 25 mg by mouth daily.   Yes Historical Provider, MD   carvedilol (COREG) 6.25 MG tablet Take 12.5 mg by mouth 2 times daily (with meals).   Yes Historical Provider, MD   Multiple Vitamin (MULTIVITAMIN PO) Take  by mouth daily.  03/16/09  Yes Historical Provider, MD   Calcium Citrate (CITRACAL PO) Take  by mouth.   Yes Historical Provider, MD     LABS:     Lab Results   Component Value Date    NA 138 05/22/2013    K 4.8 05/22/2013    CL 98 (L) 05/22/2013    CO2 28 05/22/2013    GLUCOSE 154 (H) 05/22/2013    BUN 13 05/22/2013    CREATININE 0.8 05/22/2013    LABGLOM >60 05/22/2013    GFRAA >60 05/22/2013    CALCIUM 10.1 05/22/2013    PROT 7.6 05/22/2013    LABALBU 4.5 05/22/2013    AGRATIO 1.5 05/22/2013    BILITOT 1.1 (H) 05/22/2013    ALKPHOS 64 05/22/2013    ALT 19 05/22/2013    AST 19 05/22/2013    GLOB 3.1 05/22/2013     Pathology:                 IMAGING:     Nm Bone Scan Whole Body    12/16/2014   Impression/Conclusion below  NUCLEAR MEDICINE WHOLE BODY BONE SCAN DATED December 16, 2014:  CLINICAL INDICATION: Breast cancer  COMPARISON: CT scan abdomen pelvis dated January 01, 2009  TECHNIQUE:  Following the intravenous administration of technetium-7mmethylene diphosphonate  (MDP), whole body images of the skeletal system were obtained. Regional clinically directed  views were also obtained.  DOSE:  279.0millicuries Technetium 916mDP  FINDINGS: Whole-body images demonstrate bilateral renal, and bladder activity.  Increased  activity seen in the region of the [facility], knees bilaterally, consistent degenerative  change.  Minimally increased activity seen within the mid thoracic spine, mid lumbar region, likely degenerative in nature.  No abnormal activity seen in the region of  the left hip, to correlate with the clinical symptoms of left hip pain.  COMMENT: Please note that this record was generated using voice recognition software. If there are any  questions about the content of this document, please contact the radiologist at [phone]  some errors in transcription may have occurred.  26  IMPRESSION:  1.   No abnormality seen involving the left hip to correlate with the clinical symptoms. 2.  Degenerative type pattern of activity  seen involving the AC joints, knees, mid thoracic and  lumbar spine.  SIGNED BY: DeCyndy Freeze  DO on 12/16/2014  4:40 PM    ASSESSMENT AND PLAN:       1. Malignant neoplasm of central portion of left female breast ([place])    2. Use of aromatase inhibitors    3. Chemotherapy induced cardiomyopathy ([place])      1. T1 N1 M0, hormone-receptor positive and HER-2/neu negative left breast cancer, diagnosed in 05/2007, status post lumpectomy with axillary sentinel lymph nodes, status post adjuvant AC followed by Taxol, status post adjuvant radiation, and status post five years of tamoxifen. We continue our letrozole which will be concluded in 05/2017. I will continue to follow her on a six-month schedule    2. 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 Boyd Kerbs and associates.      Electronically signed by Luciano Cutter Annell Canty, MD on 12/20/2015 at 4:33 PM

## 2016-06-29 ENCOUNTER — Encounter: Attending: Hematology & Oncology

## 2016-11-06 HISTORY — PX: ICD IMPLANT: EP1208

## 2017-03-12 NOTE — Unmapped (Signed)
Subjective:    Patient ID: Ellen Berger  Age: 56 y.o. (DOB: 04-Feb-1961)  Ethnicity: Non-Hispanic  Race: White or Caucasian  Gender: female    Referring physician: DR. Dory Peru    Chief Complaint:  Chief Complaint  Patient presents with  ?????? Apnea        HPI:    Follow up for OSA. She is  using CPAP regularly. Using 7-8 hours /night. Feeling  refreshened from sleep and daytime somnolence has improved.  SHE USES O2 WITH  CPAP due to night time oxymetry, and when she doesn't use O2 WITH CPAP she does  get fatigued and tired.    Goes to bed 9:30 PM.  Wakes up at 4:30 AM. Takes 5 minutes to fall asleep. Takes  NO naps during the day.    NEG for restless leg symptoms.    NEG for rhinitis symptoms.    CHF well controlled now s/p BIVICD  Past Medical History:  Diagnosis Date  ?????? Biventricular ICD (implantable cardioverter-defibrillator) in place  ?????? Cardiomyopathy secondary to chemotherapy (CMS HCC) 07/02/2011  ?????? Chemotherapy  ?????? Congestive heart failure, unspecified  ?????? DM2 (diabetes mellitus, type 2) (CMS HCC) 08/13/2014  ?????? Drug related polyneuropathy (CMS HCC) 06/09/2014  ?????? Hepatitis A  ?????? HLD (hyperlipidemia) 08/13/2014  ?????? Hyperlipidemia  ?????? Hypothyroid  ?????? IBS (irritable bowel syndrome) 08/13/2014  ?????? Infertility  ?????? Malignant neoplasm of left breast (CMS HCC) 08/13/2014  ?????? Migraine  ?????? Neuropathy  ?????? Radiation  ?????? Sleep apnea   cpap  ?????? Systolic CHF, chronic (CMS HCC) 07/02/2011  ?????? Thyroid disorder  ?????? Type 2 diabetes mellitus (CMS HCC)  ?????? Type 2 diabetes mellitus with diabetic retinopathy (CMS HCC) 08/13/2014    Past Surgical History:  Procedure Laterality Date  ?????? biventricular ICD  ?????? BREAST SURGERY   left lumpectomy  ?????? HX OTHER SURGICAL HISTORY   right oophrectomy  ?????? HX PACEMAKER PLACEMENT  ?????? pacemaker replaced  10/2016    Family History  Problem Relation Age of Onset  ?????? Cancer Father    PROSTATE  ?????? Cancer Paternal Grandmother    UTERINE    Current Outpatient  Prescriptions  Medication Sig Dispense Refill  ?????? acetaminophen (TYLENOL) 500 mg tablet Take 500 mg by mouth every 6 hours as  needed for Pain.  ?????? aspirin 81 mg Tablet, Delayed Release (E.C.) Take 1 Tab by mouth daily. 100  Tab 3  ?????? atorvastatin (LIPITOR) 40 mg Tablet Take 1 Tab by mouth daily. 90 Tab 2  ?????? Blood-Glucose Meter (FREESTYLE LITE METER) Kit Use 1 Kit as instructed daily.  1 Kit 0  ?????? CALCIUM ACETATE PO Take 400 mg by mouth daily.  ?????? carvedilol (COREG) 25 mg Tablet TAKE 1 TABLET TWICE A DAY WITH MEALS 180 Tab 3  ?????? CYANOCOBALAMIN, VITAMIN B-12, PO Take  by mouth daily.  ?????? FREESTYLE LITE STRIPS Strip USE DAILY 100 Strip 11  ?????? furosemide (LASIX) 40 mg tablet TAKE 1 TABLET DAILY AS NEEDED 90 Tab 2  ?????? gabapentin (NEURONTIN) 300 mg capsule Take 2 Caps by mouth 2 times daily. 360  Cap 3  ?????? letrozole (FEMARA) 2.5 mg PO Tab Take 2.5 mg by mouth daily.  ?????? levothyroxine (SYNTHROID) 75 mcg tablet Take 1 Tab by mouth daily. 90 Tab 1  ?????? metFORMIN (GLUCOPHAGE) 850 mg tablet TAKE 1 TABLET TWICE A DAY 180 Tab 0  ?????? MULTIVITAMIN (MULTIPLE VITAMINS PO) Take  by mouth daily.  ?????? potassium chloride (KLOR-CON M20)  20 mEq SR tablet Take 1 Tab by mouth daily  as needed. 90 Tab 3  ?????? pyridoxine, vitamin B6, 50 mg tablet Take 100 mg by mouth daily  ?????? sacubitril-valsartan (ENTRESTO) 49-51 mg Tablet Take 1 Tab by mouth 2 times  daily. 180 Tab 3  ?????? spironolactone (ALDACTONE) 25 mg tablet TAKE 1 TABLET DAILY 90 Tab 3    No current facility-administered medications for this visit.    Allergies  Allergen Reactions  ?????? Lyrica [Pregabalin]    MAKES HER MEAN  ?????? Ramipril Other (See Comments)    cough    Social History    Social History  ?????? Marital status: Married    Spouse name: N/A  ?????? Number of children: N/A  ?????? Years of education: N/A    Occupational History  ?????? Not on file.    Social History Main Topics  ?????? Smoking status: Never Smoker  ?????? Smokeless tobacco: Never Used  ?????? Alcohol use No  ??????  Drug use: No  ?????? Sexual activity: Not on file    Other Topics Concern  ?????? Not on file    Social History Narrative  ?????? No narrative on file      ROS:  Gen: fever; rigors; nightsweats                                  Fatigue  CV:  Chest pain; palpitations; edema   Negative  GI:  Heartburn; regurgitation; dysphagia; N/V/D; abd pain Negative  MS:  Weakness; arthritis     Negative  Heme:  Bleeding tendencies; anemia; cancer  Negative  Skin:  Rash      Actinic keratosis  HHENT:  Vis changes; rhinitis; epistaxis; pharyngitis                   Negative  Vasc: claudication; DVT     Negative  GU:  Dysuria; hematuria; BPH; nephrolithiasis  Negative  Neuro: CVA/TLA; LOC                    neuropathy  Psych:  Anxiety; schizophrenia    Negative  Respiratory:  Dyspnea, cough, sputum, hemoptysis  Negative  All others as per HPI    Objective:  Physical Exam:  Blood pressure 136/82, pulse 75, temperature 98 ??????F (36.7 ??????C), temperature source  Temporal, resp. rate 12, height 5' 5 (1.651 m), weight 250 lb (113.4 kg), SpO2  98 %, not currently breastfeeding.   O2 Delivery Device: None (Room air), Other  (Comment) (sleep machine)   Body mass index is 41.6 kg/m??????.    Constitutional:  MORBIDLY  obese    HENT: normal nasal mucosa/septum/ turbinates, teeth and gums normal  Neck: Trachea midline, symmetric, no masses, thyroid not enlarged, jugular veins  not distended.    Cardiovascular:  S1 S2 heard, No murmurs, No rubs, No gallops.    Pulmonary/Chest:Normal chest movement, no accessory muscle use, normal  percussion, normal tactile fremitus, no rales, no wheezing    Abdomen:  Non tender, no hepatosplenomegaly    Extremities: no swelling or varicosities,  distal pulses felt, No edema, No  tenderness    Lymphatic:  No lymphadenopathy noted in neck, supra clavicular and groins    Neurologic/psychiatric:  Alert & oriented x 3, mood normal, affect normal    Musculoskeletal: normal muscle strength and tone, no abnormal movements    Skin:   No rash, no lesions  Data:    Epworth sleepiness score :   4/24    PAP Compliance 03/12/2017  PAP Setting =  9  Percentage days used = 100  Average hours used = 7.18  Is patient compliant with PAP? yes  AHI = 1.1  Leak =  12secs    PSG: (2010) : AHI=26/HR      PAP TITRATION 04/20/13  TOTAL AHI = 0/hour on PAP = 8 cm of H2O  Central AHI= 0/hour  Lowest O2 desaturation = 75 %  Time spent with O2% <90 minutes= 110    OVERNIGHT OXYMETRY 2015  Times spent <88%=16 minutes on CPAP      Assessment:      ICD-10-CM  1. OSA on CPAP G47.33   Z99.89  2. Morbid obesity with BMI of 40.0-44.9, adult (CMS HCC) E66.01   Z68.41  3. 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 Sleep related hypoxia G47.34  5. Cardiomyopathy secondary to chemotherapy (CMS HCC) I42.7   T45.1X5A            Plan:    1. CONT. CPAP , excellent compliance noted. SETTINGS :9 CWP + 2LPM O2; Overnight  oxymetry results reviewed  2. Using mouth guard per dentist for her bruxism issues  3. Cont. Cardiac meds per dr. Dory Peru. Association between untreated OSA and HTN ,  heart disease was discussed in detail with patient, cause and effect.  4. CPAP maintenance, including the humidifier, replacement of masks and tubing  as necessary were discussed in detail with the patient  5. Weight loss to normal BMI is recommended  6. Recommend to avoid driving if drowsy and avoid bedtime alcohol or Non  prescription sedatives  7. Will see the patient back in the office in 12 months, will see sooner if  worsening sleep symptoms or issues with CPAP.      Samuel Bouche, M.D.  Pulmonary,CC&Sleep Medicine  [facility]  PH:  [phone].

## 2017-08-27 NOTE — Unmapped (Signed)
08/27/2017    Reason for visit: DM2  Referred by: Maximino Sarin., MD, PCP    HPI:  Pt is a 56 y.o. F with PMH sig for CM s/s chemo with bvICD, HLD, HTN, IBS,  hypothyroidism (since 2010), OSA on CPAP, breast cancer (2007) s/p L lumpectomy  and chemo and radiation- on letrozole, dermoid tumor s/p R oopherectomy Presents  for eval of Dm2.    Current sx:  Pt noted that she has not been checking sugars much; not watching her diet and  also not exercising at this time. Checked one sugar recently which was 190. She  knew she needed to see a specialist and really work on her DM.    No other acute complaints.    Details below  -----------------------------------  DM Hx:  Date of diagnosis: Dm2- 2016  Context of Dx: labs  Previous Medications: only been on MFM  Complications: DR, npy and CHF from chemo  HbA1c:  Lab Results  Component Value Date   HGBA1C 8.4 08/27/2017   HGBA1C 7.9 (HIGH) 02/14/2017   HGBA1C 7.4 (HIGH) 11/09/2016       Hypoglycemic/ Hyerglycemic Events:  Severity: moderate  Timing:  one high that she did check ON  Modifying factors: poor diet; not checking  Associated signs and symptoms: none    Current regimen:  Oral Antihyperglycemics: MFM 850mg  BID  Long acting Insulin: None  Short Acting Insulin: None  Compliance: Yes    Insulin Management:  Site Rotation: yes  Injection/Injection Sites: abd  Administered by: self    Home glucose monitoring: meter reviewed. One BG at 0300 aM of 190.  fasting am:  pre lunch:  pre-dinner:  Bedtime:  Hypoglycemic episodes/sx: none    Diet: 0600 BK: [place] slices/ fiber bar/ oatmeal; 1030AM LN: salad/ soup +  yogurt + nuts; 2PM: left over lunch; 6-7PM DN: pork chop + green beans +  potatoes; does like pasta/ bread/ potatoes. Does not like dessert. Sometimes  cheese + crackers. No juices, soda, pop. Water always.  Carb Counting: none  Exercise:none  Weight:  Wt Readings from Last 3 Encounters:  08/27/17 250 lb (113.4 kg)  05/13/17 253 lb (114.8 kg)  03/12/17 250 lb (113.4  kg)      Fam Hx:  Mother: healthy  Father: healthy  Siblings: healthy  Children: none  PGM: uterine cancer    Soc Hx:  Tobacco: never  ETOH: never  Drugs:never  Occupation: Runner, broadcasting/film/video    Review of Systems:    Pt's review of systems was completed on paper, which was reviewed and scanned in  the system. Non- endocrine issues were advised to be followed up with  appropriate physician.    The following portions of the patient's history were reviewed and updated as  appropriate: allergies, current medications, past family history, past medical  history, past social history, past surgical history and problem list. Old  records were reviewed.    Past Medical History:  History:  Patient Active Problem List  Diagnosis  ?????? Systolic CHF, chronic (CMS HCC)  ?????? 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 OSA on CPAP  ?????? Bruxism, sleep-related  ?????? Morbid obesity with BMI of 40.0-44.9, adult (CMS HCC)  ?????? Biventricular ICD (implantable cardioverter-defibrillator) in place  ?????? Adult hypothyroidism  ?????? Drug related polyneuropathy (CMS HCC)  ?????? Type 2 diabetes mellitus without complication, without long-term current use  of insulin (CMS HCC)  ?????? Mixed hyperlipidemia  ?????? Malignant neoplasm of unspecified site of unspecified  female breast (CMS HCC)  ?????? IBS (irritable bowel syndrome)  ?????? Trigger ring finger of left hand  ?????? Trigger ring finger of right hand  ?????? Folliculitis  ?????? Routine history and physical examination of adult  ?????? VT (ventricular tachycardia) (CMS HCC)  ?????? Implantable cardioverter-defibrillator (ICD) at end of life, initial encounter  ?????? ICD (implantable cardioverter-defibrillator) battery depletion  ?????? CHF NYHA class II, chronic, systolic (CMS HCC)  ?????? Routine history and physical examination of adult  ?????? Sleep related hypoxia      Past Surgical History:  Procedure Laterality Date  ?????? biventricular ICD  ?????? BREAST SURGERY   left lumpectomy  ?????? HX OTHER SURGICAL HISTORY   right  oophrectomy  ?????? HX PACEMAKER PLACEMENT  ?????? pacemaker replaced  10/2016      Allergies:  Allergies  Allergen Reactions  ?????? Lyrica [Pregabalin]    MAKES HER MEAN  ?????? Ramipril Other (See Comments)    cough      FAMILY AND SOCIAL HX:  Family History  Problem Relation Age of Onset  ?????? Cancer Father       PROSTATE  ?????? Cancer Paternal Grandmother       UTERINE      Social History    Social History  ?????? Marital status: Married    Spouse name: N/A  ?????? Number of children: N/A  ?????? Years of education: N/A    Occupational History  ?????? Not on file.    Social History Main Topics  ?????? Smoking status: Never Smoker  ?????? Smokeless tobacco: Never Used  ?????? Alcohol use No  ?????? Drug use: No  ?????? Sexual activity: Not on file    Other Topics Concern  ?????? Not on file    Social History Narrative  ?????? No narrative on file      Current Home Medications:    Current Outpatient Prescriptions:  ??????  acetaminophen (TYLENOL) 500 mg tablet, Take 500 mg by mouth every 6 hours as  needed for Pain., Disp: , Rfl:  ??????  aspirin 81 mg Tablet, Delayed Release (E.C.), Take 1 Tab by mouth daily.,  Disp: 100 Tab, Rfl: 3  ??????  atorvastatin (LIPITOR) 40 mg Tablet, TAKE 1 TABLET DAILY, Disp: 90 Tab, Rfl:  2  ??????  Blood-Glucose Meter (FREESTYLE LITE METER) Kit, Use 1 Kit as instructed  daily., Disp: 1 Kit, Rfl: 0  ??????  CALCIUM ACETATE PO, Take 400 mg by mouth daily., Disp: , Rfl:  ??????  carvedilol (COREG) 25 mg Tablet, Take 1 Tab by mouth 2 times daily (with  meals)., Disp: 180 Tab, Rfl: 3  ??????  CYANOCOBALAMIN, VITAMIN B-12, PO, Take  by mouth daily., Disp: , Rfl:  ??????  FREESTYLE LITE STRIPS Strip, USE DAILY, Disp: 100 Strip, Rfl: 11  ??????  furosemide (LASIX) 40 mg tablet, TAKE 1 TABLET DAILY AS NEEDED, Disp: 90 Tab,  Rfl: 2  ??????  gabapentin (NEURONTIN) 300 mg capsule, TAKE 2 CAPSULES TWICE A DAY, Disp: 360  Cap, Rfl: 2  ??????  letrozole (FEMARA) 2.5 mg PO Tab, Take 2.5 mg by mouth daily., Disp: , Rfl:  ??????  levothyroxine (SYNTHROID) 75 mcg tablet, Take 1 Tab  by mouth daily., Disp: 90  Tab, Rfl: 1  ??????  metFORMIN (GLUCOPHAGE) 1,000 mg tablet, Take 1 Tab by mouth 2 times daily.,  Disp: 180 Tab, Rfl: 3  ??????  MULTIVITAMIN (MULTIPLE VITAMINS PO), Take  by mouth daily., Disp: , Rfl:  ??????  potassium chloride (KLOR-CON M20) 20  mEq SR tablet, Take 1 Tab by mouth daily  as needed., Disp: 90 Tab, Rfl: 3  ??????  pyridoxine, vitamin B6, 50 mg tablet, Take 100 mg by mouth daily, Disp: ,  Rfl:  ??????  sacubitril-valsartan (ENTRESTO) 49-51 mg Tablet, Take 1 Tab by mouth 2 times  daily., Disp: 180 Tab, Rfl: 3  ??????  spironolactone (ALDACTONE) 25 mg tablet, Take 1 Tab by mouth daily., Disp: 90  Tab, Rfl: 1      Physical Exam:    Wt Readings from Last 3 Encounters:  08/27/17 250 lb (113.4 kg)  05/13/17 253 lb (114.8 kg)  03/12/17 250 lb (113.4 kg)      Vitals:  BP 124/74    Pulse 82    Ht 5' 5 (1.651 m)    Wt 250 lb (113.4 kg)    BMI 41.60  kg/m??????  General appearance: Well-developed, well-nourished, and in no distress.  Eyes: Pupils equal and reactive, extraocular eye movements intact  Neck: Supple, no adenopathy, no thyromegaly.  Chest:  clear to auscultation, no wheezes, rales or rhonchi, symmetric air entry  Heart: normal rate, regular rhythm. No murmurs, rubs, or gallops.  Abdomen: Soft, nontender, nondistended, no masses  Neurological: alert, oriented, normal speech, no focal findings or movement  disorder noted. Normal DTR bilaterally  Musculoskeletal: no joint tenderness, deformity or swelling  Extremities:  peripheral pulses normal bilaterally, no pedal edema, no clubbing  or cyanosis.  Skin: normal coloration and turgor, no rashes, no suspicious skin lesions noted.    Foot Exam: 08/2017  Skeletal:  normal  Pedal Pulses:  intact  Peripheral Circulation:  intact  Integument: normal  Neurological:  Normal monofilament and normal vibratory sensation b/l    Nursing note and vitals reviewed.      Review of Labs:      THYROID:  Lab Results  Component Value Date   TSH 2.89 08/19/2015    FREET4 1.4 08/19/2015      RENAL:  Lab Results  Component Value Date   NA 141 11/06/2016   K 4.9 11/06/2016   CO2 26 11/06/2016   BUN 13 11/06/2016   CREATININE 0.87 11/06/2016   PHOS 3.9 07/28/2012   CALCIUM 10.5 11/06/2016   GFRAA 81 11/06/2016   GFRNONAA 67 11/06/2016      LIVER:  Lab Results  Component Value Date   ALT 30 07/27/2016   AST 32 (HIGH) 07/27/2016   ALKPHOS 76 07/27/2016   BILITOT 1.5 (HIGH) 07/27/2016      LIPIDS:  Lab Results  Component Value Date   TRIG 173 (HIGH) 07/27/2016   HDL 33 (LOW) 07/27/2016   CHOLHDL 3.6 07/27/2016      Lab Results  Component Value Date   HGBA1C 8.4 08/27/2017   HGBA1C 7.9 (HIGH) 02/14/2017   HGBA1C 7.4 (HIGH) 11/09/2016      Lab Results  Component Value Date   WBC 8.5 11/06/2016      Imaging:    Assessment & Plan:  Pt is a 56 y.o. F with PMH sig for CM s/s chemo with bvICD, HLD, HTN, IBS,  hypothyroidism (since 2010), OSA on CPAP, breast cancer (2007) s/p L lumpectomy  and chemo and radiation- on letrozole, dermoid tumor s/p R oopherectomy Presents  for eval of Dm2.    1. DM type 2 with hyperglycemia:  I reviewed patient's glycemic control and the diabetic treatment regimen.  Diabetes is moderately uncontrolled  A1c was 8.4 08/2017, goal <7    On MFM 850mg   BID  Not checking much  Not watching her diet much  No exercise    Plan:  - we had a lengthy discussion about long term complications form uncontrolled  diabetes, patient  was advised to optimize  glycemic control to avoid these  complication which include but are not limited to Kidney failure , blindness and  irreversible nerve damage.  Pt verbalized understanding of the risks    - advised to check premeals and bedtime. If she cannot, advised to at least do  pre BK and then alternate between preLN, preDN, QHS so I have more data  - Advised to bring meter/log at next visit  - Advised to have no more than 45g CHO with meals and no more than 15g CHO with  snacks  - DM educ deferred per pt as she did it once and she  needs to practice it now    Regimen changes:  - I advised her that she would benefit from a GLP1 or SGLT2 at this time. SGLT2  would be beneficial esp with CHF. She does not want to start any new meds after  our lengthy discussion. She knows she needs to work on diet and lifestyle and  wants to discuss if still running high at next visit  - I advised her that I would recommend a new med at this time. Since she is not  willing to do that, she needs to inc MFM at least to full dose of 1000mg  BID. GI  Se's discussed  - She is to send me BG's in 2-4 weeks from dose inc. If still having highs we  will rediscuss SGLT2.    --Hypoglycemia symptom and correction reviewed with patient. For BG 50-70:  correct with 15 gr quick acting carbs, BG <50 correct with 30 gr carbs. Recheck  BG in 15 minutes and if not corrected repeat above cycle.    Preventative DM Management:  Urine microalbuminuria: neg 07/2016- repeat  Cr/GFR:  Nl 10/2016  ACEI/ARB:valsartan  BP goal <140/90: 124/74  LDL goal <100 without CAD; <70 with: 51 07/2016- repeat  TG:173 07/2016 - repeat  Statin:atorva 40  The ASCVD Risk score (Goff DC Jr., et al., 2013) failed to calculate for the  following reasons:    Cannot find a previous total cholesterol lab  ASA: 81mg  daily    Diabetic retinopathy: + per past hx; No history of retinopathy. Patient is up to  date on eye exam. Patient was advised on yearly eye exam.  Diabetic neuropathy: No evidence of foot ulceration. Advised on regular foot  care. On gaba 600mg  BID- npy 2/2 drugs in the past and not DM  Diabetic nephropathy: No history. Continue yearly urine micro albumin check and  aim for BP control.      -----------------------------------------------------------------------  2. Hypothyroidism:  - on LT4  Lab Results  Component Value Date   TSHREFLEX 2.43 08/14/2017   TSH 2.89 08/19/2015  - cont current dose    ------------------------------------------------------------------------  3. HTN  BP is at target.  I will continue with the current BP regimen.    ------------------------------------------------------------------------  4. Hyperlipidemia:  Patient's lipid profile was reviewed. LDL is at goal. I will continue with  current dose of lipid medications as listed above. Repeat lipids now    ------------------------------------------------------------------------  5. Obesity: class 3  Advised on life style changes including diet and exercise to lose 5-10% body  weight. Instructed to aim for 150 minutes of moderate intensity exercising. Aim  for weight loss of 1 pounds per week weight loss. Also advised on watching out  on excessive carbohydrate intake and avoid high calorie diet.  SGLT2 or GLP1 would help with wt loss but she wants to try with diet and  lifestyle at this time    ------------------------------------------------------------------------  6. Cardiomyoapthy 2/2 chemo:  - stable at this time; follows with cards    -------------------------------------------------------------------------  7. Postmenopausal with letrozole use:  - chemo induced menopause in 2007;reported past DXA to be normal- remote past  - on letrozole which can exacerbate osteoporosis  - DXA ordered now        ICD-10-CM  1. Type 2 diabetes mellitus with hyperglycemia, without long-term current use of  insulin (CMS HCC) E11.65 POCT AMB DCA HEMOGLOBIN A1C    ALBUMIN, URINE    LIPID PROFILE    metFORMIN (GLUCOPHAGE) 1,000 mg tablet  2. Postmenopausal Z78.0 MAM-DEXA SCAN AXIAL SKELETON  3. Use of letrozole (Femara) Z79.811 MAM-DEXA SCAN AXIAL SKELETON  4. Mixed hyperlipidemia E78.2  5. Essential hypertension I10  6. Obesity, Class III, BMI 40-49.9 (morbid obesity) (CMS HCC) E66.01  7. Acquired hypothyroidism E03.9        Follow up: 3 months  Future Appointments  Date Time Provider Department Center  09/20/2017 12:30 PM Dalbert Garnet., MD [id number]  11/13/2017 2:30 PM DEVICE CHECK [id number]  11/28/2017 3:30 PM  Irven Coe, MD [facility]    Irven Coe, MD    CC: Maximino Sarin., MD

## 2017-09-20 NOTE — Unmapped (Signed)
Subjective  Ellen Berger  27-Jan-1961  Chief Complaint  Patient presents with  ?????? Cardiovascular Follow-up    10 month follow up        Ellen Berger is here for follow-up of her cardiomyopathy relating to chemotherapy.  Her  ejection fraction has been about 35-40% for several years.  She's feels quite  well.  She has no complaints of chest pain or shortness of breath.  No  palpitations dizziness lightheadedness or syncope.  Denies edema orthopnea PND.  In fact she feels quite well    Patient's Medications  Current Medications   ACETAMINOPHEN (TYLENOL) 500 MG TABLET    Take 500 mg by mouth every 6 hours as  needed for Pain.      Order Dose: 500 mg   ASPIRIN 81 MG TABLET, DELAYED RELEASE (E.C.)    Take 1 Tab by mouth daily.      Order Dose: 81 mg   ATORVASTATIN (LIPITOR) 40 MG TABLET    TAKE 1 TABLET DAILY      Order Dose: --   BLOOD-GLUCOSE METER (FREESTYLE LITE METER) KIT    Use 1 Kit as instructed  daily.      Order Dose: 1 Kit   CALCIUM ACETATE PO    Take 400 mg by mouth daily.      Order Dose: 400 mg   CARVEDILOL (COREG) 25 MG TABLET    Take 1 Tab by mouth 2 times daily (with  meals).      Order Dose: 25 mg   CETIRIZINE (ZYRTEC) 10 MG TABLET    Take 1 Tab by mouth daily.      Order Dose: 10 mg   CYANOCOBALAMIN, VITAMIN B-12, PO    Take  by mouth daily.      Order Dose: --   ENTRESTO 49-51 MG TABLET    TAKE 1 TABLET TWICE A DAY      Order Dose: --   FLUTICASONE (FLONASE) 50 MCG/ACTUATION NASAL SPRAY    Spray 1 Spray into nose  daily.      Order Dose: 1 Spray   FREESTYLE LITE STRIPS STRIP    USE DAILY      Order Dose: --   FUROSEMIDE (LASIX) 40 MG TABLET    TAKE 1 TABLET DAILY AS NEEDED      Order Dose: --   GABAPENTIN (NEURONTIN) 300 MG CAPSULE    TAKE 2 CAPSULES TWICE A DAY      Order Dose: --   LETROZOLE (FEMARA) 2.5 MG PO TAB    Take 2.5 mg by mouth daily.      Order Dose: 2.5 mg   LEVOTHYROXINE (SYNTHROID) 75 MCG TABLET    Take 1 Tab by mouth daily.      Order Dose: 75 mcg   METFORMIN (GLUCOPHAGE) 1,000 MG TABLET     Take 1 Tab by mouth 2 times daily.      Order Dose: 1,000 mg   MULTIVITAMIN (MULTIPLE VITAMINS PO)    Take  by mouth daily.      Order Dose: --   POTASSIUM CHLORIDE (KLOR-CON M20) 20 MEQ SR TABLET    Take 1 Tab by mouth daily  as needed.      Order Dose: 20 mEq   PYRIDOXINE, VITAMIN B6, 50 MG TABLET    Take 100 mg by mouth daily      Order Dose: --   SPIRONOLACTONE (ALDACTONE) 25 MG TABLET    Take 1 Tab by mouth daily.  Order Dose: 25 mg                Review of Systems  Constitutional: Negative.  HENT: Negative.  Eyes: Negative.  Respiratory: Negative.  Cardiovascular: Negative.  Gastrointestinal: Negative.  Genitourinary: Negative.  Musculoskeletal: Negative.  Skin: Negative.  Neurological: Negative.  Endo/Heme/Allergies: Negative.  Psychiatric/Behavioral: Negative.  All other systems reviewed and are negative.          Vitals:   09/20/17 1218  BP: 102/64  Pulse: 85  Weight: 248 lb (112.5 kg)  Height: 5' 5 (1.651 m)    Body mass index is 41.27 kg/m??????.    Physical Exam  Constitutional: She is oriented to person, place, and time and well-developed,  well-nourished, and in no distress.  HENT:  Head: Normocephalic.  Neck: Normal range of motion. No hepatojugular reflux and no JVD present.  Carotid bruit is not present.  Cardiovascular: Normal rate, regular rhythm, normal heart sounds and intact  distal pulses.  Exam reveals no gallop and no friction rub.  No murmur heard.  Pulmonary/Chest: Effort normal and breath sounds normal.  Abdominal: Soft. There is no tenderness.  Musculoskeletal: She exhibits no edema or tenderness.  Neurological: She is alert and oriented to person, place, and time.  Skin: Skin is warm and dry.  Psychiatric: Affect normal.        IMPRESSION:      ICD-10-CM  1. 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 Systolic CHF, chronic (CMS HCC) I50.22 2D ECHO COMPLETE PRN CONTR/BUBBLE/3D      No evid of volume overload. No arrhythmic or HF complaints      PLAN AND  RECOMMENDATION:    Prescription medication review/management provided.    Patient Instructions  Continue current cardiovascular regimen.  ECHO A week before next visit      Orders Placed This Encounter  Procedures  ?????? 2D ECHO COMPLETE PRN CONTRAST/BUBBLE/3D      Return in about 10 months (around 07/21/2018).    Thank you,    Sincerely,      --------------------------------------------    Francena Hanly MD, [facility]  [facility]  [email or web address]    Office:  [phone] [facility] [place])  Office:    [phone] [place])  Office:  [phone] [facility])  Office Fax: [phone] [facility])

## 2017-12-11 NOTE — Unmapped (Signed)
12/11/2017  [place] location    Reason for visit: DM2 fu  Initially referred by: Maximino Sarin., MD, PCP    HPI:  Pt is a 57 y.o. F with PMH sig for CM s/s chemo with bvICD, HLD, HTN, IBS,  hypothyroidism (since 2010), OSA on CPAP, breast cancer (2007) s/p L lumpectomy  and chemo and radiation- on letrozole, dermoid tumor s/p R oopherectomy.  Presents for eval of Dm2.    Last visit 08/2017  Interim hx:  Did report poor diet choices and eating large portion meals/ snacking frequently  She has also not been exercising still  Not checking any sugars at home  Pt still resistant to med addition for DM control. Will see DM educ now.  Still has ongoing fatigue and would like to try brand name synthroid.  No other acute complaints.    Details below  -----------------------------------  DM Hx:  Date of diagnosis: Dm2- 2016  Context of Dx: labs  Previous Medications: only been on MFM  Complications: DR, npy and CHF from chemo  HbA1c:  Lab Results  Component Value Date   HGBA1C 7.8 12/11/2017   HGBA1C 8.4 08/27/2017   HGBA1C 7.9 (HIGH) 02/14/2017     Hypoglycemic/ Hyerglycemic Events:  Severity: moderate  Timing: not checking  Modifying factors: poor diet; not checking; no exercise  Associated signs and symptoms: none    Current regimen:  Oral Antihyperglycemics: MFM 1000 mg BID  Long acting Insulin: None  Short Acting Insulin: None  Compliance: Yes    Insulin Management: NA    Home glucose monitoring: no meter. Not checking  fasting am: None  pre lunch: None  pre-dinner: None  Bedtime: None  Hypoglycemic episodes/sx: none    Diet: 0600 BK: [place] slices/ fiber bar/ oatmeal; 1030AM LN: salad/ soup +  yogurt + nuts; 2PM: left over lunch; 6-7PM DN: pork chop + green beans +  potatoes; does like pasta/ bread/ potatoes. Does not like dessert. Sometimes  cheese + crackers. No juices, soda, pop. Water always.  Carb Counting: none  Exercise:none  Weight:  Wt Readings from Last 3 Encounters:  12/11/17 247 lb (112 kg)  09/20/17 248  lb (112.5 kg)  09/04/17 250 lb (113.4 kg)      Fam Hx:  Mother: healthy  Father: healthy  Siblings: healthy  Children: none  PGM: uterine cancer    Soc Hx:  Tobacco: never  ETOH: never  Drugs:never  Occupation: Runner, broadcasting/film/video    Review of Systems:    Pt's review of systems was completed on paper, which was reviewed and scanned in  the system. Non- endocrine issues were advised to be followed up with  appropriate physician.    The following portions of the patient's history were reviewed and updated as  appropriate: allergies, current medications, past family history, past medical  history, past social history, past surgical history and problem list. Old  records were reviewed.    Past Medical History:  History:  Patient Active Problem List  Diagnosis  ?????? Systolic CHF, chronic (CMS HCC)  ?????? 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 OSA on CPAP  ?????? Bruxism, sleep-related  ?????? Morbid obesity with BMI of 40.0-44.9, adult (CMS HCC)  ?????? Biventricular ICD (implantable cardioverter-defibrillator) in place  ?????? Adult hypothyroidism  ?????? Drug related polyneuropathy (CMS HCC)  ?????? Type 2 diabetes mellitus without complication, without long-term current use  of insulin (CMS HCC)  ?????? Mixed hyperlipidemia  ?????? Malignant neoplasm of unspecified site of unspecified  female breast (CMS HCC)  ?????? IBS (irritable bowel syndrome)  ?????? Trigger ring finger of left hand  ?????? Trigger ring finger of right hand  ?????? Folliculitis  ?????? Routine history and physical examination of adult  ?????? VT (ventricular tachycardia) (CMS HCC)  ?????? Implantable cardioverter-defibrillator (ICD) at end of life, initial encounter  ?????? ICD (implantable cardioverter-defibrillator) battery depletion  ?????? CHF NYHA class II, chronic, systolic (CMS HCC)  ?????? Routine history and physical examination of adult  ?????? Sleep related hypoxia      Past Surgical History:  Procedure Laterality Date  ?????? biventricular ICD  ?????? BREAST SURGERY   left lumpectomy  ?????? HX  OTHER SURGICAL HISTORY   right oophrectomy  ?????? HX PACEMAKER PLACEMENT  ?????? pacemaker replaced  10/2016      Allergies:  Allergies  Allergen Reactions  ?????? Lyrica [Pregabalin]    MAKES HER MEAN  ?????? Ramipril Other (See Comments)    cough      FAMILY AND SOCIAL HX:  Family History  Problem Relation Age of Onset  ?????? Cancer Father       PROSTATE  ?????? Cancer Paternal Grandmother       UTERINE      Social History    Social History  ?????? Marital status: Married    Spouse name: N/A  ?????? Number of children: N/A  ?????? Years of education: N/A    Occupational History  ?????? Not on file.    Social History Main Topics  ?????? Smoking status: Never Smoker  ?????? Smokeless tobacco: Never Used  ?????? Alcohol use No  ?????? Drug use: No  ?????? Sexual activity: Not on file    Other Topics Concern  ?????? Not on file    Social History Narrative  ?????? No narrative on file      Current Home Medications:    Current Outpatient Prescriptions:  ??????  acetaminophen (TYLENOL) 500 mg tablet, Take 500 mg by mouth every 6 hours as  needed for Pain., Disp: , Rfl:  ??????  Alcohol Swabs Pads, Medicated, Use 1 Each as instructed 4 times daily. Use to  test blood sugar.  Diagnosis for use:E11.65, Disp: 200 Each, Rfl: 3  ??????  aspirin 81 mg Tablet, Delayed Release (E.C.), Take 1 Tab by mouth daily.,  Disp: 100 Tab, Rfl: 3  ??????  atorvastatin (LIPITOR) 40 mg Tablet, TAKE 1 TABLET DAILY, Disp: 90 Tab, Rfl:  2  ??????  Blood-Glucose Meter (FREESTYLE LITE METER) Kit, Use 1 Kit as instructed  daily., Disp: 1 Kit, Rfl: 0  ??????  CALCIUM ACETATE PO, Take 400 mg by mouth daily., Disp: , Rfl:  ??????  carvedilol (COREG) 25 mg Tablet, Take 1 Tab by mouth 2 times daily (with  meals)., Disp: 180 Tab, Rfl: 3  ??????  cetirizine (ZYRTEC) 10 mg Tablet, Take 1 Tab by mouth daily. (Patient not  taking: Reported on 09/20/2017), Disp: 30 Tab, Rfl: 1  ??????  CYANOCOBALAMIN, VITAMIN B-12, PO, Take  by mouth daily., Disp: , Rfl:  ??????  ENTRESTO 49-51 mg Tablet, TAKE 1 TABLET TWICE A DAY, Disp: 180 Tab, Rfl:  3  ??????  fluticasone (FLONASE) 50 mcg/actuation nasal spray, Spray 1 Spray into nose  daily. (Patient not taking: Reported on 09/20/2017), Disp: 1 Package, Rfl: 1  ??????  FREESTYLE LITE STRIPS Strip, USE DAILY, Disp: 100 Strip, Rfl: 11  ??????  furosemide (LASIX) 40 mg tablet, TAKE 1 TABLET DAILY AS NEEDED, Disp: 90 Tab,  Rfl: 2  ??????  gabapentin (NEURONTIN) 300 mg capsule, TAKE 2 CAPSULES TWICE A DAY, Disp: 360  Cap, Rfl: 2  ??????  glucose blood test strips (ONETOUCH ULTRA TEST) Strip, Use 1 Strip as  instructed 4 times daily. Use to test blood sugar.  Diagnosis for use:E11.65,  Disp: 120 Strip, Rfl: 6  ??????  letrozole (FEMARA) 2.5 mg PO Tab, Take 2.5 mg by mouth daily., Disp: , Rfl:  ??????  metFORMIN (GLUCOPHAGE) 1,000 mg tablet, Take 1 Tab by mouth 2 times daily.,  Disp: 180 Tab, Rfl: 3  ??????  MULTIVITAMIN (MULTIPLE VITAMINS PO), Take  by mouth daily., Disp: , Rfl:  ??????  ONE TOUCH DELICA 33 gauge Misc, Use 1 Each as instructed 4 times daily. Use  to test blood sugar.  Diagnosis for use: :E11.65, Disp: 200 Each, Rfl: 5  ??????  ONE TOUCH DELICA Kit, Use 1 Each as instructed 4 times daily. Use to test  blood sugar.  Diagnosis for use:E11.65, Disp: 1 Kit, Rfl: 0  ??????  ONETOUCH ULTRA2 Kit, Use 1 Kit as instructed daily. Test sugar 4 times a  daily.  Diagnosis for use: E11.65, Disp: 1 Kit, Rfl: 0  ??????  potassium chloride (KLOR-CON M20) 20 mEq SR tablet, Take 1 Tab by mouth daily  as needed., Disp: 90 Tab, Rfl: 3  ??????  pyridoxine, vitamin B6, 50 mg tablet, Take 100 mg by mouth daily, Disp: ,  Rfl:  ??????  spironolactone (ALDACTONE) 25 mg tablet, Take 1 Tab by mouth daily., Disp: 90  Tab, Rfl: 1  ??????  SYNTHROID 75 mcg tablet, Take 1 Tab by mouth daily. 1 by mouth daily on an  empty stomach Samples of this drug were given to the patient, Quantity 2 boxes,  Lot Number [id number],  Expiration 10APR2019, Disp: 28 Tab, Rfl: 0  ??????  SYNTHROID 75 mcg tablet, Take 1 Tab by mouth daily. 1 by mouth daily on an  empty stomach, Disp: 90 Tab, Rfl:  3      Physical Exam:    Wt Readings from Last 3 Encounters:  12/11/17 247 lb (112 kg)  09/20/17 248 lb (112.5 kg)  09/04/17 250 lb (113.4 kg)      Vitals:  BP 120/70    Pulse 79    Ht 5' 5 (1.651 m)    Wt 247 lb (112 kg)    BMI 41.10  kg/m??????  General appearance: Well-developed, well-nourished, and in no distress.  Eyes: extraocular eye movements intact  Neck: Supple, no adenopathy, no thyromegaly.  Chest:  clear to auscultation, no wheezes, rales or rhonchi, symmetric air entry  Heart: normal rate, regular rhythm. No murmurs, rubs, or gallops.  Abdomen: Soft, nontender, nondistended, no masses  Neurological: alert, oriented, normal speech, no focal findings or movement  disorder noted  Extremities:  peripheral pulses normal bilaterally, no pedal edema, no clubbing  or cyanosis.  Skin: normal coloration and turgor, no rashes, no suspicious skin lesions noted.    Foot Exam: 08/2017  Skeletal:  normal  Pedal Pulses:  intact  Peripheral Circulation:  intact  Integument: normal  Neurological:  Normal monofilament and normal vibratory sensation b/l    Nursing note and vitals reviewed.      Review of Labs:      THYROID:  Lab Results  Component Value Date   TSH 2.89 08/19/2015   FREET4 1.4 08/19/2015      RENAL:  Lab Results  Component Value Date   NA 141 11/06/2016   K 4.9 11/06/2016  CO2 26 11/06/2016   BUN 13 11/06/2016   CREATININE 0.87 11/06/2016   PHOS 3.9 07/28/2012   CALCIUM 10.5 11/06/2016   GFRAA 81 11/06/2016   GFRNONAA 67 11/06/2016      LIVER:  Lab Results  Component Value Date   ALT 30 07/27/2016   AST 32 (HIGH) 07/27/2016   ALKPHOS 76 07/27/2016   BILITOT 1.5 (HIGH) 07/27/2016      LIPIDS:  Lab Results  Component Value Date   TRIG 139 12/06/2017   HDL 35 (LOW) 12/06/2017   CHOLHDL 3.2 12/06/2017      Lab Results  Component Value Date   HGBA1C 7.8 12/11/2017   HGBA1C 8.4 08/27/2017   HGBA1C 7.9 (HIGH) 02/14/2017      Lab Results  Component Value Date   WBC 8.5 11/06/2016      Imaging:  All TCH and CEW  pertinent labs and records reviewed.    DXA [facility] 08/2017 HOLOGIC  HISTORY:  Postmenopausal. Use of Letrozole.    COMPARISON:  None.    REGION ASSESSED:    L Spine (L1-L4):  BMD = 1.288 g/cm2, T-score = 2.2, Z-score = 3.4.    Left hip:  BMD = 1.336 g/cm2, T-score = 3.2, Z-score = 4.0.    Femoral Neck:  BMD = 1.191 g/cm2, T-score = 3.1, Z-score = 4.2.    Right hip:  BMD = 1.293 g/cm2, T-score = 2.9, Z-score = 3.7.    Femoral Neck:  BMD = 1.157 g/cm2, T-score = 2.8, Z-score = 3.9.    FRAX REPORT (WHO 10 Year Fracture Risk Assesment):    FRAX score is not reported; all T-scores at or above -1.0.      IMPRESSION:    1. The bone mineral density is normal.    Assessment & Plan:  Pt is a 57 y.o. F with PMH sig for CM s/s chemo with bvICD, HLD, HTN, IBS,  hypothyroidism (since 2010), OSA on CPAP, breast cancer (2007) s/p L lumpectomy  and chemo and radiation- on letrozole, dermoid tumor s/p R oopherectomy Presents  for eval of Dm2.    1. DM type 2 with hyperglycemia:  I reviewed patient's glycemic control and the diabetic treatment regimen.  Diabetes is moderately uncontrolled  A1c was 8.4 08/2017--> 7.8 12/2017; goal <7    On MFM 1000mg  BID  Not checking much  Not watching her diet much  No exercise    Plan:  - we had a lengthy discussion about long term complications form uncontrolled  diabetes, patient  was advised to optimize  glycemic control to avoid these  complication which include but are not limited to Kidney failure , blindness and  irreversible nerve damage.  Pt verbalized understanding of the risks    - advised to check premeals and bedtime. If she cannot, advised to at least do  pre BK and then alternate between preLN, preDN, QHS so I have more data  - Advised to bring meter/log at next visit  - Advised to have no more than 45g CHO with meals and no more than 15g CHO with  snacks  - DM educ referral advised and pt willing to go now as she knows that she needs  to work on diet and lifestyle.    Regimen changes:  -  I advised her that she would benefit from a GLP1 or SGLT2 again.  SGLT2 would  be beneficial esp with CHF. She does not want to start any new meds after our  lengthy discussion again. She knows she needs to work on diet and lifestyle and  wants to now see DM educ prior to decision to start another med. I told her that  we allowed her to do this last time and if still not at goal next time, we need  to start another med  - I advised her that I would recommend a new med at this time. Since she is not  willing to do that, she needs to cont MFM 1000mg  BID. GI Se's discussed    --Hypoglycemia symptom and correction reviewed with patient. For BG 50-70:  correct with 15 gr quick acting carbs, BG <50 correct with 30 gr carbs. Recheck  BG in 15 minutes and if not corrected repeat above cycle.    Preventative DM Management:  Urine microalbuminuria: neg 12/2017  Cr/GFR:  Nl 10/2016  ACEI/ARB: valsartan (entresto)  BP goal <140/90: 120/70  LDL goal <100 without CAD; <70 with: 48 12/2017  TG: 139 12/2017  Statin:atorva 40  The ASCVD Risk score Denman George DC Jr., et al., 2013) failed to calculate for the  following reasons:    The valid total cholesterol range is 130 to 320 mg/dL  ASA: 81mg  daily    Diabetic retinopathy: + per past hx; No history of retinopathy. Patient is up to  date on eye exam. Patient was advised on yearly eye exam.  Diabetic neuropathy: No evidence of foot ulceration. Advised on regular foot  care. On gaba 600mg  BID- npy 2/2 drugs in the past and not DM  Diabetic nephropathy: No history. Continue yearly urine micro albumin check and  aim for BP control.      -----------------------------------------------------------------------  2. Hypothyroidism:  - on LT4  Lab Results  Component Value Date   TSHREFLEX 2.43 08/14/2017   TSH 2.89 08/19/2015  - cont current dose- wanted synthroid brand name- samples given  - Also ordered 90 day supply at [facility] pharmacy for cost  benefits    ------------------------------------------------------------------------  3. HTN  BP is at target. I will continue with the current BP regimen.    ------------------------------------------------------------------------  4. Hyperlipidemia:  Patient's lipid profile was reviewed. LDL is at goal. I will continue with  current dose of lipid medications as listed above. Repeat lipids now    ------------------------------------------------------------------------  5. Obesity: class 3  Advised on life style changes including diet and exercise to lose 5-10% body  weight. Instructed to aim for 150 minutes of moderate intensity exercising. Aim  for weight loss of 1 pounds per week weight loss. Also advised on watching out  on excessive carbohydrate intake and avoid high calorie diet.  SGLT2 or GLP1 would help with wt loss but she still wants to try with diet and  lifestyle at this time    ------------------------------------------------------------------------  6. Cardiomyoapthy 2/2 chemo:  - stable at this time; follows with cards    -------------------------------------------------------------------------  7. Postmenopausal with letrozole use:  - chemo induced menopause in 2007;reported past DXA to be normal- remote past  - on letrozole which can exacerbate osteoporosis  - DXA nl 08/2017      ICD-10-CM  1. Type 2 diabetes mellitus without complication, without long-term current use  of insulin (CMS HCC) E11.9 ONETOUCH ULTRA2 Kit    glucose blood test strips (ONETOUCH ULTRA TEST) Strip    ONE TOUCH DELICA 33 gauge Misc    ONE TOUCH DELICA Kit    Alcohol Swabs Pads, Medicated    POCT AMB DCA HEMOGLOBIN A1C  AMB REFERRAL TO DIABETIC EDUCATION  2. Acquired hypothyroidism E03.9 TSH 3RD GEN, REFLEX FT4  3. Mixed hyperlipidemia E78.2  4. Obesity, Class III, BMI 40-49.9 (morbid obesity) (CMS HCC) E66.01  5. Essential hypertension I10    Follow up: 3 months per pt pref  Future Appointments  Date Time Provider  Department Center  01/20/2018 3:50 PM DEVICE CHECK [id number]  01/22/2018 3:00 PM DIAB DIET Christella Scheuermann [facility]  03/05/2018 10:30 AM Irven Coe, MD [facility] ENDO5 [facility]  07/09/2018 12:30 PM [facility]  08/01/2018 10:30 AM Dalbert Garnet., MD [id number]    Irven Coe, MD    CC: Maximino Sarin., MD

## 2018-01-22 NOTE — Unmapped (Signed)
Initial Diabetes Education Assessment    Ellen Berger, 04-02-1961, was seen by a Diabetes Educator for an initial  assessment.  Pt has had some DM education in the past.  Works as a Runner, broadcasting/film/video;  would like to complete comprehensive education during this summer. Ellen Berger states  she has been eating too much,  Would like to lose wt.  Pt has been eating 3  meals daily, 1 snack.  Pt's snacks are high in carb.  Often doesn't have carbs  with lunch.  Ellen Berger loves to cook and follows a Na restricted diet, therefore,  most of her meals are homemade. Ellen Berger has not been exercising, though she does  own exercise equipment.    Pt has been testing BG daily and takes DM meds as  directed.    Recommendations:  30g-45g CHO per meal, 15g CHO snacks; Provided meal and snack example sheets  Start exercising; ultimate goal 150 minutes per week; 10 minute increments count  towards goal  Reviewed My Fitness Pal for tracking foods    Blood glucose logs reveal:  150 fasting am.  120 HS    Relevant data:  Wt Readings from Last 3 Encounters:  01/24/18 246 lb 12.8 oz (111.9 kg)  01/20/18 248 lb (112.5 kg)  12/11/17 247 lb (112 kg)    Body mass index is 41.07 kg/m??????.    Lab Results  Component Value Date   HGBA1C 7.8 12/11/2017   HGBA1C 8.4 08/27/2017   HGBA1C 7.9 (HIGH) 02/14/2017      The assessment indicated education was needed in the following areas:  ?????? Disease Process  ?????? Nutritional management  ?????? Physical activity  ?????? Medications  ?????? Monitoring  ?????? Prevent, detect and treat acute complications  ?????? Prevent, detect and treat chronic complications  ?????? Goal setting and problem solving  ?????? Psychosocial adjustment    The following concepts were introduced and discussed:  ?????? Blood glucose monitoring  ?????? Target blood glucose ranges  ?????? Testing times  ?????? Basic carbohydrate counting  ?????? Food label reading.    Plan: The patient has identified her ongoing goal(s) as:  ?????? To better manage my blood glucose  ?????? To work toward my goal  weight    The patient plans to meet her goal(s) by working on the following behavior(s):  ?????? Keep a food log  ?????? Will walk 5 times a week for 30 minutes    The patient was asked to keep a food log and initiate/continue a blood glucose  log which will be reviewed at each visit.    Patient plans to attend her first Group session on 03/12/18.      Signature: Katherine L. Thad Ranger, MS, RD, LD    Date:  01/22/2018

## 2018-03-05 NOTE — Unmapped (Signed)
03/05/2018  [place] location    Reason for visit: DM2 fu  Initially referred by: Maximino Sarin., MD, PCP    HPI:  Pt is a 57 y.o. F with PMH sig for CM s/s chemo with bvICD, HLD, HTN, IBS,  hypothyroidism (since 2010), OSA on CPAP, breast cancer (2007) s/p L lumpectomy  and chemo and radiation- on letrozole, dermoid tumor s/p R oopherectomy.  Presents for eval of Dm2.    Last visit 12/2017  Interim hx:  Last DM educ 01/2018; still cont to sees them  Has not been exercising at all.  Off from school and enjoying her summer. Not checking sugars though  Wanted a new meter that was covered now.    Pt still resistant to med addition for DM control. I told her that last visit  her A1c was the same and we cannot keep it above goal each time. She showed  understanding but now states that if A1c not below 7 by 05/2018 then she is  willing to add another med. Will not take anything but MFM for now.    Still has ongoing fatigue. Was able to get brand name synthroid from [facility]  pharmacy and just started on of that this week. Was on generic prior to  that. No other acute complaints.    Details below  -----------------------------------  DM Hx:  Date of diagnosis: Dm2- 2016  Context of Dx: labs  Previous Medications: only been on MFM  Complications: DR, npy and CHF from chemo  HbA1c:  Lab Results  Component Value Date   HGBA1C 7.8 12/11/2017   HGBA1C 8.4 08/27/2017   HGBA1C 7.9 (HIGH) 02/14/2017     Hypoglycemic/ Hyerglycemic Events:  Severity: moderate  Timing: not checking  Modifying factors: poor diet; not checking; no exercise  Associated signs and symptoms: none    Current regimen:  Oral Antihyperglycemics: MFM 1000 mg BID  Long acting Insulin: None  Short Acting Insulin: None  Compliance: Yes    Insulin Management: NA    Home glucose monitoring: no meter. Not checking  fasting am: None  pre lunch: None  pre-dinner: None  Bedtime: None  Hypoglycemic episodes/sx: none    Diet: 0600 BK: [place] slices/ fiber bar/ oatmeal;  1030AM LN: salad/ soup +  yogurt + nuts; 2PM: left over lunch; 6-7PM DN: pork chop + green beans +  potatoes; does like pasta/ bread/ potatoes. Does not like dessert. Sometimes  cheese + crackers. No juices, soda, pop. Water always.  Carb Counting: none  Exercise:none  Weight:  Wt Readings from Last 3 Encounters:  03/05/18 250 lb (113.4 kg)  01/24/18 246 lb 12.8 oz (111.9 kg)  01/20/18 248 lb (112.5 kg)      Fam Hx:  Mother: healthy  Father: healthy  Siblings: healthy  Children: none  PGM: uterine cancer    Soc Hx:  Tobacco: never  ETOH: never  Drugs:never  Occupation: Runner, broadcasting/film/video    Review of Systems:    Pt's review of systems was completed on paper, which was reviewed and scanned in  the system. Non- endocrine issues were advised to be followed up with  appropriate physician.    The following portions of the patient's history were reviewed and updated as  appropriate: allergies, current medications, past family history, past medical  history, past social history, past surgical history and problem list. Old  records were reviewed.    Past Medical History:  History:  Patient Active Problem List  Diagnosis  ??????  Systolic CHF, chronic (CMS HCC)  ?????? 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 OSA on CPAP  ?????? Bruxism, sleep-related  ?????? Morbid obesity with BMI of 40.0-44.9, adult (CMS HCC)  ?????? Biventricular ICD (implantable cardioverter-defibrillator) in place  ?????? Adult hypothyroidism  ?????? Drug related polyneuropathy (CMS HCC)  ?????? Type 2 diabetes mellitus without complication, without long-term current use  of insulin (CMS HCC)  ?????? Mixed hyperlipidemia  ?????? Malignant neoplasm of unspecified site of unspecified female breast (CMS HCC)  ?????? IBS (irritable bowel syndrome)  ?????? Trigger ring finger of left hand  ?????? Trigger ring finger of right hand  ?????? Folliculitis  ?????? Routine history and physical examination of adult  ?????? VT (ventricular tachycardia) (CMS HCC)  ?????? Implantable cardioverter-defibrillator  (ICD) at end of life, initial encounter  ?????? ICD (implantable cardioverter-defibrillator) battery depletion  ?????? CHF NYHA class II, chronic, systolic (CMS HCC)  ?????? Routine history and physical examination of adult  ?????? Sleep related hypoxia      Past Surgical History:  Procedure Laterality Date  ?????? biventricular ICD  ?????? BREAST SURGERY   left lumpectomy  ?????? HX OTHER SURGICAL HISTORY   right oophrectomy  ?????? HX PACEMAKER PLACEMENT  ?????? pacemaker replaced  10/2016      Allergies:  Allergies  Allergen Reactions  ?????? Lyrica [Pregabalin]    MAKES HER MEAN  ?????? Ramipril Other (See Comments)    cough      FAMILY AND SOCIAL HX:  Family History  Problem Relation Name Age of Onset  ?????? Cancer Father       PROSTATE  ?????? Cancer Paternal Grandmother       UTERINE      Social History    Socioeconomic History  ?????? Marital status: Married    Spouse name: Not on file  ?????? Number of children: Not on file  ?????? Years of education: Not on file  ?????? Highest education level: Not on file  Occupational History  ?????? Not on file  Social Needs  ?????? Financial resource strain: Not on file  ?????? Food insecurity:    Worry: Not on file    Inability: Not on file  ?????? Transportation needs:    Medical: Not on file    Non-medical: Not on file  Tobacco Use  ?????? Smoking status: Never Smoker  ?????? Smokeless tobacco: Never Used  Substance and Sexual Activity  ?????? Alcohol use: No  ?????? Drug use: No  ?????? Sexual activity: Not on file  Lifestyle  ?????? Physical activity:    Days per week: Not on file    Minutes per session: Not on file  ?????? Stress: Not on file  Relationships  ?????? Social connections:    Talks on phone: Not on file    Gets together: Not on file    Attends religious service: Not on file    Active member of club or organization: Not on file    Attends meetings of clubs or organizations: Not on file    Relationship status: Not on file  ?????? Intimate partner violence:    Fear of current or ex partner: Not on file    Emotionally abused: Not on file     Physically abused: Not on file    Forced sexual activity: Not on file  Other Topics Concern  ?????? Not on file  Social History Narrative  ?????? Not on file      Current Home Medications:    Current Outpatient Medications:  ??????  acetaminophen (TYLENOL) 500 mg tablet, Take 500 mg by mouth every 6 hours as  needed for Pain., Disp: , Rfl:  ??????  Alcohol Swabs Pads, Medicated, Use 1 Each as instructed 4 times daily. Use to  test blood sugar.  Diagnosis for use:E11.65, Disp: 200 Each, Rfl: 3  ??????  aspirin 81 mg Tablet, Delayed Release (E.C.), Take 1 Tab by mouth daily.,  Disp: 100 Tab, Rfl: 3  ??????  atorvastatin (LIPITOR) 40 mg Tablet, TAKE 1 TABLET DAILY, Disp: 90 Tab, Rfl:  2  ??????  CALCIUM ACETATE PO, Take 400 mg by mouth daily., Disp: , Rfl:  ??????  carvedilol (COREG) 25 mg Tablet, Take 1 Tab by mouth 2 times daily (with  meals)., Disp: 180 Tab, Rfl: 3  ??????  cetirizine (ZYRTEC) 10 mg Tablet, Take 1 Tab by mouth daily. (Patient not  taking: Reported on 09/20/2017), Disp: 30 Tab, Rfl: 1  ??????  CYANOCOBALAMIN, VITAMIN B-12, PO, Take  by mouth daily., Disp: , Rfl:  ??????  ENTRESTO 49-51 mg Tablet, TAKE 1 TABLET TWICE A DAY, Disp: 180 Tab, Rfl: 3  ??????  fluticasone (FLONASE) 50 mcg/actuation nasal spray, Spray 1 Spray into nose  daily. (Patient not taking: Reported on 09/20/2017), Disp: 1 Package, Rfl: 1  ??????  furosemide (LASIX) 40 mg tablet, TAKE 1 TABLET DAILY AS NEEDED, Disp: 90 Tab,  Rfl: 2  ??????  gabapentin (NEURONTIN) 300 mg capsule, TAKE 2 CAPSULES TWICE A DAY, Disp: 360  Cap, Rfl: 2  ??????  letrozole (FEMARA) 2.5 mg PO Tab, Take 2.5 mg by mouth daily., Disp: , Rfl:  ??????  levothyroxine (SYNTHROID) 75 mcg tablet, TAKE 1 TABLET DAILY, Disp: 90 Tab,  Rfl: 0  ??????  metFORMIN (GLUCOPHAGE) 1,000 mg tablet, Take 1 Tab by mouth 2 times daily.,  Disp: 180 Tab, Rfl: 3  ??????  MULTIVITAMIN (MULTIPLE VITAMINS PO), Take  by mouth daily., Disp: , Rfl:  ??????  ONE TOUCH DELICA 33 gauge Misc, Use 1 Each as instructed 4 times daily. Use  to test blood  sugar.  Diagnosis for use: :E11.65, Disp: 200 Each, Rfl: 5  ??????  ONE TOUCH DELICA Kit, Use 1 Each as instructed 4 times daily. Use to test  blood sugar.  Diagnosis for use:E11.65, Disp: 1 Kit, Rfl: 0  ??????  ONETOUCH VERIO IQ METER Misc, Use 1 Each as instructed once for 1 dose. Use  to test blood sugar.  Diagnosis for use: E11.9, Disp: 1 Each, Rfl: 6  ??????  ONETOUCH VERIO Strip, Use 1 Strip as instructed 3 times daily. Use to test  blood sugar. Diagnosis for use: E 11.9, Disp: 100 Strip, Rfl: 6  ??????  potassium chloride (KLOR-CON M20) 20 mEq SR tablet, Take 1 Tab by mouth daily  as needed., Disp: 90 Tab, Rfl: 3  ??????  pyridoxine, vitamin B6, 50 mg tablet, Take 100 mg by mouth daily, Disp: ,  Rfl:  ??????  spironolactone (ALDACTONE) 25 mg tablet, TAKE 1 TABLET DAILY, Disp: 90 Tab,  Rfl: 3      Physical Exam:    Wt Readings from Last 3 Encounters:  03/05/18 250 lb (113.4 kg)  01/24/18 246 lb 12.8 oz (111.9 kg)  01/20/18 248 lb (112.5 kg)      Vitals:  BP 118/74    Pulse 84    Ht 5' 5 (1.651 m)    Wt 250 lb (113.4 kg)    BMI 41.60  kg/m??????  General appearance: Well-developed, well-nourished, and in no distress.  Eyes: extraocular eye movements intact  Neck: Supple, no adenopathy, no thyromegaly.  Chest:  clear to auscultation, no wheezes, rales or rhonchi, symmetric air entry  Heart: normal rate, regular rhythm. No murmurs, rubs, or gallops.  Neurological: alert, oriented, normal speech, no focal findings or movement  disorder noted  Extremities:  peripheral pulses normal bilaterally, no pedal edema  Skin: normal coloration and turgor, no rashes, no suspicious skin lesions noted.    Foot Exam: 08/2017  Skeletal:  normal  Pedal Pulses:  intact  Peripheral Circulation:  intact  Integument: normal  Neurological:  Normal monofilament and normal vibratory sensation b/l    Nursing note and vitals reviewed.      Review of Labs:      THYROID:  Lab Results  Component Value Date   TSH 2.89 08/19/2015   FREET4 1.4  08/19/2015      RENAL:  Lab Results  Component Value Date   NA 141 11/06/2016   K 4.9 11/06/2016   CO2 26 11/06/2016   BUN 13 11/06/2016   CREATININE 0.87 11/06/2016   PHOS 3.9 07/28/2012   CALCIUM 10.5 11/06/2016   GFRAA 81 11/06/2016   GFRNONAA 67 11/06/2016      LIVER:  Lab Results  Component Value Date   ALT 30 07/27/2016   AST 32 (HIGH) 07/27/2016   ALKPHOS 76 07/27/2016   BILITOT 1.5 (HIGH) 07/27/2016      LIPIDS:  Lab Results  Component Value Date   TRIG 139 12/06/2017   HDL 35 (LOW) 12/06/2017   CHOLHDL 3.2 12/06/2017      Lab Results  Component Value Date   HGBA1C 7.8 12/11/2017   HGBA1C 8.4 08/27/2017   HGBA1C 7.9 (HIGH) 02/14/2017      Lab Results  Component Value Date   WBC 8.5 11/06/2016      Imaging:  All [facility] and [facility] pertinent labs and records reviewed.    DXA [facility] 08/2017 HOLOGIC  HISTORY:  Postmenopausal. Use of Letrozole.    COMPARISON:  None.    REGION ASSESSED:    L Spine (L1-L4):  BMD = 1.288 g/cm2, T-score = 2.2, Z-score = 3.4.    Left hip:  BMD = 1.336 g/cm2, T-score = 3.2, Z-score = 4.0.    Femoral Neck:  BMD = 1.191 g/cm2, T-score = 3.1, Z-score = 4.2.    Right hip:  BMD = 1.293 g/cm2, T-score = 2.9, Z-score = 3.7.    Femoral Neck:  BMD = 1.157 g/cm2, T-score = 2.8, Z-score = 3.9.    FRAX REPORT (WHO 10 Year Fracture Risk Assesment):    FRAX score is not reported; all T-scores at or above -1.0.      IMPRESSION:    1. The bone mineral density is normal.    Assessment & Plan:  Pt is a 57 y.o. F with PMH sig for CM s/s chemo with bvICD, HLD, HTN, IBS,  hypothyroidism (since 2010), OSA on CPAP, breast cancer (2007) s/p L lumpectomy  and chemo and radiation- on letrozole, dermoid tumor s/p R oopherectomy Presents  for eval of Dm2.    1. DM type 2 with hyperglycemia:  I reviewed patient's glycemic control and the diabetic treatment regimen.  Diabetes is moderately uncontrolled  A1c was 8.4 08/2017--> 7.8 12/2017---> 7.8 02/2018; goal <7    On MFM 1000mg  BID  Not checking much  Not watching her diet  but still does see DM educ  No exercise  Still wants no other meds  Plan:  - we again had a lengthy discussion about long term complications form  uncontrolled diabetes, patient  was advised to optimize  glycemic control to  avoid these  complication which include but are not limited to Kidney failure ,  blindness and irreversible nerve damage.  Pt verbalized understanding of the risks    - advised to check premeals and bedtime. If she cannot, advised to at least do  pre BK and then alternate between preLN, preDN, QHS so I have more data. New  meter and supplies sent per her request  - Advised to bring meter/log at next visit  - Advised to have no more than 45g CHO with meals and no more than 15g CHO with  snacks  - DM educ to be continued esp since she still wants no other meds    Regimen changes:  - I advised her again that she would benefit from a GLP1 or SGLT2.  SGLT2 would  be beneficial esp with CHF. She does not want to start any new meds after our  lengthy discussion again. She knows she needs to work on diet and lifestyle and  wants to see DM educ till this summer before adding new meds. This was the same  plan last visit and I told her that we cannot keep her A1c above goal each time.  She showed understanding and promised to start something new if needed at next  visit.  - I advised her that I would recommend a new med at this time. Since she is not  willing to do that, she needs to cont MFM 1000mg  BID. GI Se's discussed    --Hypoglycemia symptom and correction reviewed with patient. For BG 50-70:  correct with 15 gr quick acting carbs, BG <50 correct with 30 gr carbs. Recheck  BG in 15 minutes and if not corrected repeat above cycle.    Preventative DM Management:  Urine microalbuminuria: neg 12/2017  Cr/GFR:  Nl 10/2016 --- repeat  ACEI/ARB: valsartan (entresto)  BP goal <140/90: 118/74  LDL goal <100 without CAD; <70 with: 48 12/2017  TG: 139 12/2017  Statin:atorva 40  The ASCVD Risk score Denman George DC Jr.,  et al., 2013) failed to calculate for the  following reasons:    The valid total cholesterol range is 130 to 320 mg/dL  ASA: 81mg  daily    Diabetic retinopathy: + per past hx; No history of retinopathy. Patient is NOT  up to date on eye exam. Patient was advised on yearly eye exam.  Diabetic neuropathy: No evidence of foot ulceration. Advised on regular foot  care. On gaba 600mg  BID- npy 2/2 drugs in the past and not DM  Diabetic nephropathy: No history. Continue yearly urine micro albumin check and  aim for BP control.    -----------------------------------------------------------------------  2. Hypothyroidism:  - on LT4  Lab Results  Component Value Date   TSHREFLEX 2.43 08/14/2017   TSH 2.89 08/19/2015  - cont current dose- wanted synthroid brand name- samples given  - Also ordered 90 day supply at [facility] pharmacy for cost benefits    ------------------------------------------------------------------------  3. HTN  BP is at target. I will continue with the current BP regimen.    ------------------------------------------------------------------------  4. Hyperlipidemia:  Patient's lipid profile was reviewed. LDL is at goal. I will continue with  current dose of lipid medications as listed above.    ------------------------------------------------------------------------  5. Obesity: class 3  Advised on life style changes including diet and exercise  to lose 5-10% body  weight. Instructed to aim for 150 minutes of moderate intensity exercising. Aim  for weight loss of 1 pounds per week weight loss. Also advised on watching out  on excessive carbohydrate intake and avoid high calorie diet.  SGLT2 or GLP1 would help with wt loss but she still wants to still try with diet  and lifestyle at this time    ------------------------------------------------------------------------  6. Cardiomyoapthy 2/2 chemo:  - stable at this time; follows with  cards    -------------------------------------------------------------------------  7. Postmenopausal with letrozole use:  - chemo induced menopause in 2007;reported past DXA to be normal- remote past  - on letrozole which can exacerbate osteoporosis  - DXA nl 08/2017      ICD-10-CM  1. Type 2 diabetes mellitus with hyperglycemia, without long-term current use of  insulin (CMS HCC) E11.65 ONETOUCH VERIO IQ METER Misc    ONETOUCH VERIO Strip    POCT AMB DCA HEMOGLOBIN A1C    RENAL PROFILE  2. Acquired hypothyroidism E03.9 TSH 3RD GEN, REFLEX FT4  3. Mixed hyperlipidemia E78.2  4. Obesity, Class III, BMI 40-49.9 (morbid obesity) (CMS HCC) E66.01  5. Essential hypertension I10  6. Postmenopausal Z78.0  7. Use of letrozole (Femara) Z79.811    Follow up: 3 months per pt pref  Future Appointments  Date Time Provider Department Center  03/12/2018  1:30 PM DIAB DIET [facility]  03/25/2018  1:30 PM DIAB DIET [facility]  04/16/2018  1:30 PM DIAB DIET [facility] CL[facility]  07/03/2018  3:00 PM Irven Coe, MD [facility] ENDO5 [facility] CL[facility]IC  07/09/2018 12:30 PM [facility]  07/30/2018  3:30 PM DEVICE CHECK [id number]  08/01/2018 10:30 AM Dalbert Garnet., MD [facility]    Irven Coe, MD    CC: Maximino Sarin., MD

## 2018-07-24 NOTE — Unmapped (Signed)
07/24/2018  [place] location    Reason for visit: DM2 fu  Initially referred by: Maximino Sarin., MD, PCP    HPI:  Pt is a 57 y.o. F with PMH sig for CM s/s chemo with bvICD, HLD, HTN, IBS,  hypothyroidism (since 2010), OSA on CPAP, breast cancer (2007) s/p L lumpectomy  and chemo and radiation- on letrozole, dermoid tumor s/p R oopherectomy.  Presents for eval of Dm2.    Last visit 02/2018  Interim hx:  Last DM educ 04/2018; still cont to sees them  Has been walking 3 miles per day; 16 lb wt loss since last visit  Does want to exercise more, lose more weight  No meter today    Pt still resistant to med addition for DM control. I told her that since a1c  improving we can keep seeing how she does with lifestyle changes. However, if  a1c rising or still above goal, we will need to consider other meds.    Still getting brand name synthroid from [facility] pharmacy and taking daily.  Takes it properly. No missed doses.    Has not done labs I ordered last time    Details below  -----------------------------------  DM Hx:  Date of diagnosis: Dm2- 2016  Context of Dx: labs  Previous Medications: only been on MFM  Complications: DR, npy and CHF from chemo  HbA1c:  Lab Results  Component Value Date   HGBA1C 7.2 07/24/2018   HGBA1C 7.8 03/05/2018   HGBA1C 7.8 12/11/2017     Hypoglycemic/ Hyerglycemic Events:  Severity: moderate  Timing: 140-150s per report in AM  Modifying factors: poor diet; not checking much; no exercise  Associated signs and symptoms: none    Current regimen:  Oral Antihyperglycemics: MFM 1000 mg BID  Long acting Insulin: None  Short Acting Insulin: None  Compliance: Yes    Insulin Management: NA    Home glucose monitoring: no meter.verbal report  fasting am: 140-150s  pre lunch: None  pre-dinner: None  Bedtime: None  Hypoglycemic episodes/sx: none    Diet: 0600 BK: [place] slices/ fiber bar/ oatmeal; 1030AM LN: salad/ soup +  yogurt + nuts; 2PM: left over lunch; 6-7PM DN: pork chop + green beans  +  potatoes; does like pasta/ bread/ potatoes. Does not like dessert. Sometimes  cheese + crackers. No juices, soda, pop. Water always.  Carb Counting: none  Exercise:none  Weight:  Wt Readings from Last 3 Encounters:  07/24/18 234 lb (106.1 kg)  07/09/18 245 lb (111.1 kg)  05/02/18 245 lb (111.1 kg)      Fam Hx:  Mother: healthy  Father: healthy  Siblings: healthy  Children: none  PGM: uterine cancer    Soc Hx:  Tobacco: never  ETOH: never  Drugs:never  Occupation: Runner, broadcasting/film/video    Review of Systems:    Pt's review of systems was completed on paper, which was reviewed and scanned in  the system. Non- endocrine issues were advised to be followed up with  appropriate physician.    The following portions of the patient's history were reviewed and updated as  appropriate: allergies, current medications, past family history, past medical  history, past social history, past surgical history and problem list. Old  records were reviewed.    Past Medical History:  History:  Patient Active Problem List  Diagnosis  ?????? Systolic CHF, chronic (CMS HCC)  ?????? 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 OSA on CPAP  ?????? Bruxism, sleep-related  ??????  Morbid obesity with BMI of 40.0-44.9, adult (CMS HCC)  ?????? Biventricular ICD (implantable cardioverter-defibrillator) in place  ?????? Adult hypothyroidism  ?????? Drug related polyneuropathy (CMS HCC)  ?????? Type 2 diabetes mellitus without complication, without long-term current use  of insulin (CMS HCC)  ?????? Mixed hyperlipidemia  ?????? Malignant neoplasm of unspecified site of unspecified female breast (CMS HCC)  ?????? IBS (irritable bowel syndrome)  ?????? Trigger ring finger of left hand  ?????? Trigger ring finger of right hand  ?????? Folliculitis  ?????? Routine history and physical examination of adult  ?????? VT (ventricular tachycardia) (CMS HCC)  ?????? Implantable cardioverter-defibrillator (ICD) at end of life, initial encounter  ?????? ICD (implantable cardioverter-defibrillator) battery  depletion  ?????? CHF NYHA class II, chronic, systolic (CMS HCC)  ?????? Sleep related hypoxia      Past Surgical History:  Procedure Laterality Date  ?????? biventricular ICD  ?????? BREAST SURGERY   left lumpectomy  ?????? HX OTHER SURGICAL HISTORY   right oophrectomy  ?????? HX PACEMAKER PLACEMENT  ?????? pacemaker replaced  10/2016      Allergies:  Allergies  Allergen Reactions  ?????? Lyrica [Pregabalin]    MAKES HER MEAN  ?????? Ramipril Other (See Comments)    cough      FAMILY AND SOCIAL HX:  Family History  Problem Relation Name Age of Onset  ?????? Cancer Father       PROSTATE  ?????? Cancer Paternal Grandmother       UTERINE      Social History    Socioeconomic History  ?????? Marital status: Married    Spouse name: Not on file  ?????? Number of children: Not on file  ?????? Years of education: Not on file  ?????? Highest education level: Not on file  Occupational History  ?????? Not on file  Social Needs  ?????? Financial resource strain: Not on file  ?????? Food insecurity    Worry: Not on file    Inability: Not on file  ?????? Transportation needs    Medical: Not on file    Non-medical: Not on file  Tobacco Use  ?????? Smoking status: Never Smoker  ?????? Smokeless tobacco: Never Used  Substance and Sexual Activity  ?????? Alcohol use: No  ?????? Drug use: No  ?????? Sexual activity: Not on file  Lifestyle  ?????? Physical activity    Days per week: Not on file    Minutes per session: Not on file  ?????? Stress: Not on file  Relationships  ?????? Social Musician on phone: Not on file    Gets together: Not on file    Attends religious service: Not on file    Active member of club or organization: Not on file    Attends meetings of clubs or organizations: Not on file    Relationship status: Not on file  ?????? Intimate partner violence    Fear of current or ex partner: Not on file    Emotionally abused: Not on file    Physically abused: Not on file    Forced sexual activity: Not on file  Other Topics Concern  ?????? Not on file  Social History Narrative  ?????? Not on  file      Current Home Medications:    Current Outpatient Medications:  ??????  acetaminophen (TYLENOL) 500 mg tablet, Take 500 mg by mouth every 6 hours as  needed for Pain., Disp: , Rfl:  ??????  Alcohol Swabs Pads, Medicated, Use 1 Each as  instructed 4 times daily. Use to  test blood sugar.  Diagnosis for use:E11.65, Disp: 200 Each, Rfl: 3  ??????  aspirin 81 mg Tablet, Delayed Release (E.C.), Take 1 Tab by mouth daily.,  Disp: 100 Tab, Rfl: 3  ??????  atorvastatin (LIPITOR) 40 mg Tablet, Take 1 Tab by mouth daily., Disp: 90  Tab, Rfl: 2  ??????  CALCIUM ACETATE PO, Take 400 mg by mouth daily., Disp: , Rfl:  ??????  carvedilol (COREG) 25 mg Tablet, TAKE 1 TABLET TWICE A DAY WITH MEALS, Disp:  180 Tab, Rfl: 3  ??????  cetirizine (ZYRTEC) 10 mg Tablet, Take 1 Tab by mouth daily., Disp: 30 Tab,  Rfl: 1  ??????  CYANOCOBALAMIN, VITAMIN B-12, PO, Take  by mouth daily., Disp: , Rfl:  ??????  ENTRESTO 49-51 mg Tablet, TAKE 1 TABLET TWICE A DAY, Disp: 180 Tab, Rfl: 3  ??????  fluticasone (FLONASE) 50 mcg/actuation nasal spray, Spray 1 Spray into nose  daily., Disp: 1 Package, Rfl: 1  ??????  FREESTYLE LITE STRIPS Strip, USE DAILY, Disp: 100 Strip, Rfl: 3  ??????  furosemide (LASIX) 40 mg tablet, TAKE 1 TABLET DAILY AS NEEDED, Disp: 90 Tab,  Rfl: 2  ??????  letrozole (FEMARA) 2.5 mg PO Tab, Take 2.5 mg by mouth daily., Disp: , Rfl:  ??????  levothyroxine (SYNTHROID) 75 mcg tablet, TAKE 1 TABLET DAILY, Disp: 90 Tab,  Rfl: 0  ??????  metFORMIN (GLUCOPHAGE) 1,000 mg tablet, Take 1 Tab by mouth 2 times daily.,  Disp: 180 Tab, Rfl: 3  ??????  MULTIVITAMIN (MULTIPLE VITAMINS PO), Take  by mouth daily., Disp: , Rfl:  ??????  ONE TOUCH DELICA 33 gauge Misc, Use 1 Each as instructed 4 times daily. Use  to test blood sugar.  Diagnosis for use: :E11.65, Disp: 200 Each, Rfl: 5  ??????  ONE TOUCH DELICA Kit, Use 1 Each as instructed 4 times daily. Use to test  blood sugar.  Diagnosis for use:E11.65, Disp: 1 Kit, Rfl: 0  ??????  ONETOUCH VERIO Strip, Use 1 Strip as instructed 3 times daily.  Use to test  blood sugar. Diagnosis for use: E 11.9, Disp: 100 Strip, Rfl: 6  ??????  potassium chloride (KLOR-CON M20) 20 mEq SR tablet, Take 1 Tab by mouth daily  as needed., Disp: 90 Tab, Rfl: 3  ??????  pyridoxine, vitamin B6, 50 mg tablet, Take 100 mg by mouth daily, Disp: ,  Rfl:  ??????  spironolactone (ALDACTONE) 25 mg tablet, TAKE 1 TABLET DAILY, Disp: 90 Tab,  Rfl: 3      Physical Exam:    Wt Readings from Last 3 Encounters:  07/24/18 234 lb (106.1 kg)  07/09/18 245 lb (111.1 kg)  05/02/18 245 lb (111.1 kg)      Vitals:  BP 120/62    Pulse 76    Ht 5' 5 (1.651 m)    Wt 234 lb (106.1 kg)     Breastfeeding? No    BMI 38.94 kg/m??????  General appearance: Well-developed, well-nourished, and in no distress.  Eyes: extraocular eye movements intact  Neck: Supple, no adenopathy, no thyromegaly.  Chest:  clear to auscultation  Heart: normal rate, regular rhythm. No murmurs, rubs, or gallops.  Neurological: alert, oriented, normal speech, no focal findings or movement  disorder noted  Extremities:  peripheral pulses normal bilaterally, no pedal edema  Skin: normal coloration and turgor, no rashes, no suspicious skin lesions noted.    Foot Exam: 08/2017  Skeletal:  normal  Pedal Pulses:  intact  Peripheral Circulation:  intact  Integument: normal  Neurological:  Normal monofilament and normal vibratory sensation b/l    Nursing note and vitals reviewed.      Review of Labs:      THYROID:  Lab Results  Component Value Date   TSH 2.89 08/19/2015   FREET4 1.4 08/19/2015      RENAL:  Lab Results  Component Value Date   NA 141 11/06/2016   K 4.9 11/06/2016   CO2 26 11/06/2016   BUN 13 11/06/2016   CREATININE 0.87 11/06/2016   PHOS 3.9 07/28/2012   CALCIUM 10.5 11/06/2016   GFRAA 81 11/06/2016   GFRNONAA 67 11/06/2016      LIVER:  Lab Results  Component Value Date   ALT 30 07/27/2016   AST 32 (HIGH) 07/27/2016   ALKPHOS 76 07/27/2016   BILITOT 1.5 (HIGH) 07/27/2016      LIPIDS:  Lab Results  Component Value Date   TRIG 139  12/06/2017   HDL 35 (LOW) 12/06/2017   CHOLHDL 3.2 12/06/2017      Lab Results  Component Value Date   HGBA1C 7.2 07/24/2018   HGBA1C 7.8 03/05/2018   HGBA1C 7.8 12/11/2017      Lab Results  Component Value Date   WBC 8.5 11/06/2016      Imaging:  All TCH and CEW pertinent labs and records reviewed.    DXA [facility] 08/2017 HOLOGIC  HISTORY:  Postmenopausal. Use of Letrozole.    COMPARISON:  None.    REGION ASSESSED:    L Spine (L1-L4):  BMD = 1.288 g/cm2, T-score = 2.2, Z-score = 3.4.    Left hip:  BMD = 1.336 g/cm2, T-score = 3.2, Z-score = 4.0.    Femoral Neck:  BMD = 1.191 g/cm2, T-score = 3.1, Z-score = 4.2.    Right hip:  BMD = 1.293 g/cm2, T-score = 2.9, Z-score = 3.7.    Femoral Neck:  BMD = 1.157 g/cm2, T-score = 2.8, Z-score = 3.9.    FRAX REPORT (WHO 10 Year Fracture Risk Assesment):    FRAX score is not reported; all T-scores at or above -1.0.      IMPRESSION:    1. The bone mineral density is normal.    Assessment & Plan:  Pt is a 57 y.o. F with PMH sig for CM s/s chemo with bvICD, HLD, HTN, IBS,  hypothyroidism (since 2010), OSA on CPAP, breast cancer (2007) s/p L lumpectomy  and chemo and radiation- on letrozole, dermoid tumor s/p R oopherectomy Presents  for eval of Dm2.    1. DM type 2 with hyperglycemia:  I reviewed patient's glycemic control and the diabetic treatment regimen.  Diabetes is moderately uncontrolled  A1c was 8.4 08/2017--> 7.8 12/2017---> 7.8 02/2018--> 7.2 07/2018; goal <7    On MFM 1000mg  BID  Not checking much  Walking more now  15 lb wt loss  Still wants no other meds    Plan:  - we again had a lengthy discussion about long term complications form  uncontrolled diabetes, patient  was advised to optimize  glycemic control to  avoid these  complication which include but are not limited to Kidney failure ,  blindness and irreversible nerve damage.  Pt verbalized understanding of the risks    - advised to check premeals and bedtime. If she cannot, advised to at least do  pre BK and then  alternate between preLN, preDN, QHS so I have more data.  -  Advised to bring meter/log at next visit  - Advised to have no more than 45g CHO with meals and no more than 15g CHO with  snacks  - DM educ to be continued esp since she still wants no other meds    Regimen changes:  - I advised her again that she would benefit from a GLP1 or SGLT2.  SGLT2 would  be beneficial esp with CHF. She does not want to start any new meds after our  lengthy discussion again. Her a1c is improving so if she continues to lose  weight and keep walking then I am okay to see how she is doing next time prior  to addition of meds.  - cont MFM 1000mg  BID. GI Se's discussed    --Hypoglycemia symptom and correction reviewed with patient. For BG 50-70:  correct with 15 gr quick acting carbs, BG <50 correct with 30 gr carbs. Recheck  BG in 15 minutes and if not corrected repeat above cycle.    Preventative DM Management:  Urine microalbuminuria: neg 12/2017  Cr/GFR:  Nl 10/2016 --- repeat not done  ACEI/ARB: valsartan (entresto)  BP goal <140/90: at goal  LDL goal <100 without CAD; <70 with: 48 12/2017  TG: 139 12/2017  Statin:atorva 40  The ASCVD Risk score Denman George DC Jr., et al., 2013) failed to calculate for the  following reasons:    The valid total cholesterol range is 130 to 320 mg/dL  ASA: 81mg  daily    Diabetic retinopathy: + per past hx; No history of retinopathy. Patient is NOT  up to date on eye exam. Patient was advised on yearly eye exam.reminded again  Diabetic neuropathy: No evidence of foot ulceration. Advised on regular foot  care. On gaba 600mg  BID- npy 2/2 drugs in the past and not DM  Diabetic nephropathy: No history. Continue yearly urine micro albumin check and  aim for BP control.    -----------------------------------------------------------------------  2. Hypothyroidism:  - on brand name synthroid - gets at [facility] pharmacy. Will refill ones labs  are back  Lab Results  Component Value Date   TSHREFLEX 2.43 08/14/2017    TSH 2.89 08/19/2015  - repeat levels not done    ------------------------------------------------------------------------  3. HTN  BP is at target. I will continue with the current BP regimen.    ------------------------------------------------------------------------  4. Hyperlipidemia:  Patient's lipid profile was reviewed. LDL is at goal. I will continue with  current dose of lipid medications as listed above.    ------------------------------------------------------------------------  5. Obesity: class 3  Advised on life style changes including diet and exercise to lose 5-10% body  weight. Instructed to aim for 150 minutes of moderate intensity exercising. Aim  for weight loss of 1 pounds per week weight loss. Also advised on watching out  on excessive carbohydrate intake and avoid high calorie diet.  SGLT2 or GLP1 would help with wt loss but she still wants to still try with diet  and lifestyle at this time    ------------------------------------------------------------------------  6. Cardiomyoapthy 2/2 chemo:  - stable at this time; follows with cards    -------------------------------------------------------------------------  7. Postmenopausal with letrozole use:  - chemo induced menopause in 2007;reported past DXA to be normal- remote past  - on letrozole which can exacerbate osteoporosis  - DXA nl 08/2017      ICD-10-CM  1. Type 2 diabetes mellitus with hyperglycemia, without long-term current use of  insulin (CMS HCC) E11.65 RENAL PROFILE    POCT AMB DCA  HEMOGLOBIN A1C  2. Acquired hypothyroidism E03.9 TSH 3RD GEN, REFLEX FT4  3. Mixed hyperlipidemia E78.2  4. Essential hypertension I10  5. Postmenopausal Z78.0  6. Obesity, Class III, BMI 40-49.9 (morbid obesity) (CMS HCC) E66.01  7. Use of letrozole (Femara) Z79.811    Follow up: 3 months per pt pref  Future Appointments  Date Time Provider Department Center  07/30/2018  3:30 PM DEVICE CHECK [id number]  08/01/2018 10:30 AM Dalbert Garnet., MD [facility]  10/22/2018  3:10 PM Maximino Sarin., MD [facility]  10/23/2018  3:00 PM Irven Coe, MD [facility]    Irven Coe, MD    CC: Maximino Sarin., MD

## 2018-08-01 NOTE — Unmapped (Signed)
Subjective  Ellen Berger  [birth year]  Chief Complaint  Patient presents with  ?????? 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  She has done well with improvement in her EF to almost 45%.  Medical therapy and biventricular pacing have obviously helped her.  She has had  no complaints of chest pain, shortness of breath, fluid overload.  She has not  had a decompensation episode.    She is moving to [place], [place] in May of 2020 after retirement.  She is on a good medical regimen at this time.    Patient's Medications  Current Medications   ACETAMINOPHEN (TYLENOL) 500 MG TABLET    Take 500 mg by mouth every 6 hours as  needed for Pain.      Order Dose: 500 mg   ALCOHOL SWABS PADS, MEDICATED    Use 1 Each as instructed 4 times daily. Use to  test blood sugar.  Diagnosis for use:E11.65      Order Dose: 1 Each   ASPIRIN 81 MG TABLET, DELAYED RELEASE (E.C.)    Take 1 Tab by mouth daily.      Order Dose: 81 mg   ATORVASTATIN (LIPITOR) 40 MG TABLET    Take 1 Tab by mouth daily.      Order Dose: 40 mg   CALCIUM ACETATE PO    Take 400 mg by mouth daily.      Order Dose: 400 mg   CARVEDILOL (COREG) 25 MG TABLET    TAKE 1 TABLET TWICE A DAY WITH MEALS      Order Dose: --   CETIRIZINE (ZYRTEC) 10 MG TABLET    Take 1 Tab by mouth daily.      Order Dose: 10 mg   CYANOCOBALAMIN, VITAMIN B-12, PO    Take  by mouth daily.      Order Dose: --   ENTRESTO 49-51 MG TABLET    TAKE 1 TABLET TWICE A DAY      Order Dose: --   FLUTICASONE (FLONASE) 50 MCG/ACTUATION NASAL SPRAY    Spray 1 Spray into nose  daily.      Order Dose: 1 Spray   FREESTYLE LITE STRIPS STRIP    USE DAILY      Order Dose: --   FUROSEMIDE (LASIX) 40 MG TABLET    TAKE 1 TABLET DAILY AS NEEDED      Order Dose: --   LETROZOLE (FEMARA) 2.5 MG PO TAB    Take 2.5 mg by mouth daily.      Order Dose: 2.5 mg   LEVOTHYROXINE (SYNTHROID) 75 MCG TABLET    TAKE 1 TABLET DAILY      Order Dose: --   METFORMIN  (GLUCOPHAGE) 1,000 MG TABLET    Take 1 Tab by mouth 2 times daily.      Order Dose: 1,000 mg   MULTIVITAMIN (MULTIPLE VITAMINS PO)    Take  by mouth daily.      Order Dose: --   ONE TOUCH DELICA 33 GAUGE MISC    Use 1 Each as instructed 4 times daily. Use  to test blood sugar.  Diagnosis for use: :E11.65      Order Dose: 1 Each   ONE TOUCH DELICA KIT    Use 1 Each as instructed 4 times daily. Use to test  blood sugar.  Diagnosis for use:E11.65      Order Dose: 1 Each  ONETOUCH VERIO STRIP    Use 1 Strip as instructed 3 times daily. Use to test  blood sugar. Diagnosis for use: E 11.9      Order Dose: 1 Strip   POTASSIUM CHLORIDE (KLOR-CON M20) 20 MEQ SR TABLET    Take 1 Tab by mouth daily  as needed.      Order Dose: 20 mEq   PYRIDOXINE, VITAMIN B6, 50 MG TABLET    Take 100 mg by mouth daily      Order Dose: --   SPIRONOLACTONE (ALDACTONE) 25 MG TABLET    TAKE 1 TABLET DAILY      Order Dose: --                Review of Systems  Constitutional: Positive for malaise/fatigue.  HENT: Negative.  Eyes: Negative.  Respiratory: Negative.  Cardiovascular: Negative.  Gastrointestinal: Negative.  Genitourinary: Negative.  Musculoskeletal: Negative.  Skin: Negative.  Neurological: Positive for dizziness.  Endo/Heme/Allergies: Negative.  Psychiatric/Behavioral: Negative.          Vitals:   08/01/18 1039  BP: 96/62  Pulse: 82  Weight: 236 lb (107 kg)  Height: 5' 5 (1.651 m)    Body mass index is 39.27 kg/m??????.    Physical Exam  Constitutional: He is oriented to person, place, and time and well-developed,  well-nourished, and in no distress.  HENT:  Head: Normocephalic.  Neck: Normal range of motion. No hepatojugular reflux and no JVD present.  Carotid bruit is not present.  Cardiovascular: Normal rate, regular rhythm, normal heart sounds and intact  distal pulses. Exam reveals no gallop and no friction rub.  No murmur heard.  Pulmonary/Chest: Effort normal and breath sounds normal.  Abdominal: Soft. There is no tenderness.  Musculoskeletal:     General: No tenderness or edema.    Neurological: He is alert and oriented to person, place, and time.  Skin: Skin is warm and dry.  Psychiatric: Affect normal.        IMPRESSION:      ICD-10-CM  1. CHF NYHA class II, chronic, systolic (CMS HCC) I50.22 BASIC METABOLIC PANEL  (BMP=EP1)    CBC (COMPLETE BLOOD COUNT)    TSH 3RD GEN, REFLEX FT4  2. Biventricular ICD (implantable cardioverter-defibrillator) in place Z95.810  3. Morbid obesity with BMI of 40.0-44.9, adult (CMS HCC) E66.01   Z68.41    No evidence of decompensated heart failure.  EF has improved nicely on her  current regimen and device therapies.  Will follow-up routine labs and make a recommendation for follow-up in  [facility] as well.  Will see her back in office a few weeks before her moved to [place].    PLAN AND RECOMMENDATION:    Prescription medication review/management provided.    Patient Instructions  Continue current cardiovascular regimen.  Labs today        Orders Placed This Encounter  Procedures  ?????? Basic Metabolic Panel (BMP=ep1)  ?????? CBC (complete blood count)  ?????? Tsh 3rd gen, reflex ft4      Return in about 6 months (around 01/31/2019).    Thank you,    Sincerely,      --------------------------------------------    Francena Hanly MD, [facility]  [facility]  [email or web address]    Office:  [phone] [facility])  Office:    [phone] [facility])  Office:  [phone] [facility])  Office Fax: [phone] [facility])

## 2018-10-23 NOTE — Unmapped (Signed)
10/23/2018  [place] location    Reason for visit: DM2 fu  Initially referred by: Maximino Sarin., MD, PCP    HPI:  Pt is a 58 y.o. F with PMH sig for CM s/s chemo with bvICD, HLD, HTN, IBS,  hypothyroidism (since 2010), OSA on CPAP, breast cancer (2007) s/p L lumpectomy  and chemo and radiation- on letrozole, dermoid tumor s/p R oopherectomy.  Presents for eval of Dm2.    Last visit 07/2018  Interim hx:  Last DM educ 04/2018  Did not walk as much or exercise recently. Still has 1 lb wt loss  She and her husbands bday was in the holiday time too and they ate poorly  Does want to exercise more, lose more weight  No meter today again    Pt still resistant to med addition for DM control. a1c rising today so per our  prior conversation I offered lother DM meds to her. She declined. She is moving  to [place] In 02/2019. She wants to see me again in 3 months and promised that if a1c  high and rising then, she will try it.    Still getting brand name synthroid from [facility] pharmacy and taking daily.  Takes it properly. No missed doses.  Denied hot/cold intolerance, d/c, palpitations, tremors, skin/hair/vision  changes; Denied neck compressive sx.    Details below  -----------------------------------  DM Hx:  Date of diagnosis: Dm2- 2016  Context of Dx: labs  Previous Medications: only been on MFM  Complications: DR, npy and CHF from chemo  HbA1c:  Lab Results  Component Value Date   HGBA1C 7.5 10/23/2018   HGBA1C 7.2 07/24/2018   HGBA1C 7.8 03/05/2018     Hypoglycemic/ Hyerglycemic Events:  Severity: moderate  Timing: highs in am  Modifying factors: poor diet; not checking much; no exercise  Associated signs and symptoms: none    Current regimen:  Oral Antihyperglycemics: MFM 1000 mg BID  Long acting Insulin: None  Short Acting Insulin: None  Compliance: Yes    Insulin Management: NA    Home glucose monitoring: no meter.verbal report  fasting am: 130-190; 190 once  pre lunch: None  pre-dinner: None  Bedtime:  None  Hypoglycemic episodes/sx: none    Diet: 0600 BK: [place] slices/ fiber bar/ oatmeal; 1030AM LN: salad/ soup +  yogurt + nuts; 2PM: left over lunch; 6-7PM DN: pork chop + green beans +  potatoes; does like pasta/ bread/ potatoes. Does not like dessert. Sometimes  cheese + crackers. No juices, soda, pop. Water always.  Carb Counting: none  Exercise:none  Weight:  Wt Readings from Last 3 Encounters:  10/23/18 238 lb (108 kg)  10/22/18 240 lb (108.9 kg)  08/01/18 236 lb (107 kg)      Fam Hx:  Mother: healthy  Father: healthy  Siblings: healthy  Children: none  PGM: uterine cancer    Soc Hx:  Tobacco: never  ETOH: never  Drugs:never  Occupation: Runner, broadcasting/film/video    Review of Systems:    Pt's review of systems was completed on paper, which was reviewed and scanned in  the system. Non- endocrine issues were advised to be followed up with  appropriate physician.    The following portions of the patient's history were reviewed and updated as  appropriate: allergies, current medications, past family history, past medical  history, past social history, past surgical history and problem list. Old  records were reviewed.    Past Medical History:  History:  Patient Active  Problem List  Diagnosis  ?????? Systolic CHF, chronic (CMS HCC)  ?????? 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 OSA on CPAP  ?????? Bruxism, sleep-related  ?????? Morbid obesity with BMI of 40.0-44.9, adult (CMS HCC)  ?????? Biventricular ICD (implantable cardioverter-defibrillator) in place  ?????? Adult hypothyroidism  ?????? Drug related polyneuropathy (CMS HCC)  ?????? Type 2 diabetes mellitus without complication, without long-term current use  of insulin (CMS HCC)  ?????? Mixed hyperlipidemia  ?????? Malignant neoplasm of unspecified site of unspecified female breast (CMS HCC)  ?????? IBS (irritable bowel syndrome)  ?????? Trigger ring finger of left hand  ?????? Trigger ring finger of right hand  ?????? Routine history and physical examination of adult  ?????? VT (ventricular  tachycardia) (CMS HCC)  ?????? Implantable cardioverter-defibrillator (ICD) at end of life, initial encounter  ?????? ICD (implantable cardioverter-defibrillator) battery depletion  ?????? CHF NYHA class II, chronic, systolic (CMS HCC)  ?????? Sleep related hypoxia  ?????? Intrinsic eczema      Past Surgical History:  Procedure Laterality Date  ?????? biventricular ICD  ?????? BREAST SURGERY   left lumpectomy  ?????? HX OTHER SURGICAL HISTORY   right oophrectomy  ?????? HX PACEMAKER PLACEMENT  ?????? pacemaker replaced  10/2016      Allergies:  Allergies  Allergen Reactions  ?????? Lyrica [Pregabalin]    MAKES HER MEAN  ?????? Ramipril Other (See Comments)    cough      FAMILY AND SOCIAL HX:  Family History  Problem Relation Name Age of Onset  ?????? Cancer Father       PROSTATE  ?????? Cancer Paternal Grandmother       UTERINE      Social History    Socioeconomic History  ?????? Marital status: Married    Spouse name: Not on file  ?????? Number of children: Not on file  ?????? Years of education: Not on file  ?????? Highest education level: Not on file  Occupational History  ?????? Not on file  Social Needs  ?????? Financial resource strain: Not on file  ?????? Food insecurity    Worry: Not on file    Inability: Not on file  ?????? Transportation needs    Medical: Not on file    Non-medical: Not on file  Tobacco Use  ?????? Smoking status: Never Smoker  ?????? Smokeless tobacco: Never Used  Substance and Sexual Activity  ?????? Alcohol use: No  ?????? Drug use: No  ?????? Sexual activity: Not on file  Lifestyle  ?????? Physical activity    Days per week: Not on file    Minutes per session: Not on file  ?????? Stress: Not on file  Relationships  ?????? Social Musician on phone: Not on file    Gets together: Not on file    Attends religious service: Not on file    Active member of club or organization: Not on file    Attends meetings of clubs or organizations: Not on file    Relationship status: Not on file  ?????? Intimate partner violence    Fear of current or ex partner: Not on file     Emotionally abused: Not on file    Physically abused: Not on file    Forced sexual activity: Not on file  Other Topics Concern  ?????? Not on file  Social History Narrative  ?????? Not on file      Current Home Medications:    Current Outpatient Medications:  ??????  Alcohol  Swabs Pads, Medicated, Use 1 Each as instructed 4 times daily. Use to  test blood sugar.  Diagnosis for use:E11.65, Disp: 200 Each, Rfl: 3  ??????  aspirin 81 mg Tablet, Delayed Release (E.C.), Take 1 Tab by mouth daily.,  Disp: 100 Tab, Rfl: 3  ??????  atorvastatin (LIPITOR) 40 mg Tablet, Take 1 Tab by mouth daily., Disp: 90  Tab, Rfl: 2  ??????  CALCIUM ACETATE PO, Take 400 mg by mouth daily., Disp: , Rfl:  ??????  carvedilol (COREG) 25 mg Tablet, TAKE 1 TABLET TWICE A DAY WITH MEALS, Disp:  180 Tab, Rfl: 3  ??????  cetirizine (ZYRTEC) 10 mg Tablet, Take 1 Tab by mouth daily. (Patient not  taking: Reported on 10/22/2018), Disp: 30 Tab, Rfl: 1  ??????  CYANOCOBALAMIN, VITAMIN B-12, PO, Take  by mouth daily., Disp: , Rfl:  ??????  ENTRESTO 49-51 mg Tablet, TAKE 1 TABLET TWICE A DAY, Disp: 180 Tab, Rfl: 3  ??????  fluticasone (FLONASE) 50 mcg/actuation nasal spray, Spray 1 Spray into nose  daily., Disp: 1 Package, Rfl: 1  ??????  FREESTYLE LITE STRIPS Strip, USE DAILY, Disp: 100 Strip, Rfl: 3  ??????  furosemide (LASIX) 40 mg tablet, TAKE 1 TABLET DAILY AS NEEDED, Disp: 90 Tab,  Rfl: 2  ??????  gabapentin (NEURONTIN) 300 mg capsule, Take 300 mg by mouth 2 times daily.  TAKING 3 CAPSULES IN THE AM 2 CAPSULE IN THE PM, Disp: , Rfl:  ??????  letrozole (FEMARA) 2.5 mg PO Tab, Take 2.5 mg by mouth daily., Disp: , Rfl:  ??????  levothyroxine (SYNTHROID) 75 mcg tablet, Take 1 Tab (75 mcg total) by mouth  daily., Disp: 90 Tab, Rfl: 3  ??????  metFORMIN (GLUCOPHAGE) 1,000 mg tablet, TAKE 1 TABLET TWICE A DAY, Disp: 180  Tab, Rfl: 4  ??????  MULTIVITAMIN (MULTIPLE VITAMINS PO), Take  by mouth daily., Disp: , Rfl:  ??????  ONE TOUCH DELICA 33 gauge Misc, Use 1 Each as instructed 4 times daily. Use  to test blood  sugar.  Diagnosis for use: :E11.65, Disp: 200 Each, Rfl: 5  ??????  ONE TOUCH DELICA Kit, Use 1 Each as instructed 4 times daily. Use to test  blood sugar.  Diagnosis for use:E11.65, Disp: 1 Kit, Rfl: 0  ??????  ONETOUCH VERIO Strip, Use 1 Strip as instructed 3 times daily. Use to test  blood sugar. Diagnosis for use: E 11.9, Disp: 100 Strip, Rfl: 6  ??????  potassium chloride (KLOR-CON M20) 20 mEq SR tablet, Take 1 Tab by mouth daily  as needed., Disp: 90 Tab, Rfl: 3  ??????  pyridoxine, vitamin B6, 50 mg tablet, Take 100 mg by mouth daily, Disp: ,  Rfl:  ??????  spironolactone (ALDACTONE) 25 mg tablet, TAKE 1 TABLET DAILY, Disp: 90 Tab,  Rfl: 3      Physical Exam:    Wt Readings from Last 3 Encounters:  10/23/18 238 lb (108 kg)  10/22/18 240 lb (108.9 kg)  08/01/18 236 lb (107 kg)      Vitals:  BP 118/68    Pulse 88    Ht 5' 5 (1.651 m)    Wt 238 lb (108 kg)     Breastfeeding No    BMI 39.61 kg/m??????  General appearance: Well-developed, well-nourished, and in no distress.  Eyes: extraocular eye movements intact  Neck: Supple, no adenopathy, no thyromegaly.  Neurological: alert, oriented, normal speech, no focal findings or movement  disorder noted  Extremities:  peripheral pulses normal  bilaterally, no pedal edema  Skin: normal coloration and turgor, no rashes, no suspicious skin lesions noted.    Foot Exam: 10/2018  Skeletal:  normal  Pedal Pulses:  intact  Peripheral Circulation:  intact  Integument: normal  Neurological:  Normal monofilament and normal vibratory sensation b/l    Nursing note and vitals reviewed.      Review of Labs:      THYROID:  Lab Results  Component Value Date   TSH 2.89 08/19/2015   FREET4 1.4 08/19/2015      RENAL:  Lab Results  Component Value Date   NA 137 08/04/2018   K 3.7 08/04/2018   CO2 25 08/04/2018   BUN 17 08/04/2018   CREATININE 0.82 08/04/2018   PHOS 4.0 08/04/2018   CALCIUM 9.9 08/04/2018   GFRAA 87 08/04/2018   GFRNONAA 72 08/04/2018      LIVER:  Lab Results  Component Value Date   ALT 30  07/27/2016   AST 32 (HIGH) 07/27/2016   ALKPHOS 76 07/27/2016   BILITOT 1.5 (HIGH) 07/27/2016      LIPIDS:  Lab Results  Component Value Date   TRIG 139 12/06/2017   HDL 35 (LOW) 12/06/2017   CHOLHDL 3.2 12/06/2017      Lab Results  Component Value Date   HGBA1C 7.5 10/23/2018   HGBA1C 7.2 07/24/2018   HGBA1C 7.8 03/05/2018      Lab Results  Component Value Date   WBC 8.6 08/04/2018      Imaging:  All T[facility] and CEW pertinent labs and records reviewed.    DXA [facility] 08/2017 HOLOGIC  HISTORY:  Postmenopausal. Use of Letrozole.    COMPARISON:  None.    REGION ASSESSED:    L Spine (L1-L4):  BMD = 1.288 g/cm2, T-score = 2.2, Z-score = 3.4.    Left hip:  BMD = 1.336 g/cm2, T-score = 3.2, Z-score = 4.0.    Femoral Neck:  BMD = 1.191 g/cm2, T-score = 3.1, Z-score = 4.2.    Right hip:  BMD = 1.293 g/cm2, T-score = 2.9, Z-score = 3.7.    Femoral Neck:  BMD = 1.157 g/cm2, T-score = 2.8, Z-score = 3.9.    FRAX REPORT (WHO 10 Year Fracture Risk Assesment):    FRAX score is not reported; all T-scores at or above -1.0.      IMPRESSION:    1. The bone mineral density is normal.    Assessment & Plan:  Pt is a 58 y.o. F with PMH sig for CM s/s chemo with bvICD, HLD, HTN, IBS,  hypothyroidism (since 2010), OSA on CPAP, breast cancer (2007) s/p L lumpectomy  and chemo and radiation- on letrozole, dermoid tumor s/p R oopherectomy Presents  for eval of Dm2.    1. DM type 2 with hyperglycemia:  I reviewed patient's glycemic control and the diabetic treatment regimen.  Diabetes is moderately uncontrolled  A1c was 8.4 08/2017--> 7.8 12/2017---> 7.8 02/2018--> 7.2 07/2018---->7.5 10/2018;  goal <7    On MFM 1000mg  BID  Not checking much  Still wants no other meds    Plan:  - aware of long term complications form uncontrolled diabetes, patient  was  advised to optimize  glycemic control to avoid these  complication which include  but are not limited to Kidney failure , blindness and irreversible nerve damage.  Pt verbalized understanding of the  risks    - advised to check premeals and bedtime. If she cannot, advised to at least  do  pre BK and then alternate between preLN, preDN, QHS so I have more data.  - Advised to bring meter/log at next visit  - Advised to have no more than 45g CHO with meals and no more than 15g CHO with  snacks  - DM educ to be continued esp since she still wants no other meds    Regimen changes:  - I advised her again that she would benefit from a GLP1 or SGLT2.  SGLT2 would  be beneficial esp with CHF. She does not want to start any new meds after our  lengthy discussion again.  - cont MFM 1000mg  BID. GI Se's discussed    --Hypoglycemia symptom and correction reviewed with patient. For BG 50-70:  correct with 15 gr quick acting carbs, BG <50 correct with 30 gr carbs. Recheck  BG in 15 minutes and if not corrected repeat above cycle.    Preventative DM Management:  Urine microalbuminuria: neg 12/2017  Cr/GFR:  GFR 72 07/2018  ACEI/ARB: valsartan (entresto)  BP goal <140/90: at goal  LDL goal <100 without CAD; <70 with: 48 12/2017  TG: 139 12/2017  Statin:atorva 40  The ASCVD Risk score Denman George DC Jr., et al., 2013) failed to calculate for the  following reasons:    The valid total cholesterol range is 130 to 320 mg/dL  ASA: 81mg  daily  Repeat lipids and renal urine advised; she wanted to wait till she was in [place]    Diabetic retinopathy: + per past hx; No history of retinopathy. Patient is NOT  up to date on eye exam. Patient was advised on yearly eye exam.appt tomorrow  Diabetic neuropathy: No evidence of foot ulceration. Advised on regular foot  care. On gaba 600mg  BID- npy 2/2 drugs in the past and not DM  Diabetic nephropathy: CKD 2.  Continue yearly urine micro albumin check and aim  for BP control.    -----------------------------------------------------------------------  2. Hypothyroidism:  - on brand name synthroid - gets at [facility] pharmacy. Will refill ones labs  are back  Lab Results  Component Value Date   TSHREFLEX 1.90  08/04/2018   TSH 2.89 08/19/2015    ------------------------------------------------------------------------  3. HTN  BP is at target. I will continue with the current BP regimen.    ------------------------------------------------------------------------  4. Hyperlipidemia:  Patient's lipid profile was reviewed. LDL is at goal. I will continue with  current dose of lipid medications as listed above.    ------------------------------------------------------------------------  5. Obesity: class 3  Advised on life style changes including diet and exercise to lose 5-10% body  weight. Instructed to aim for 150 minutes of moderate intensity exercising. Aim  for weight loss of 1 pounds per week weight loss. Also advised on watching out  on excessive carbohydrate intake and avoid high calorie diet.  SGLT2 or GLP1 would help with wt loss but she still wants to still try with diet  and lifestyle at this time    ------------------------------------------------------------------------  6. Cardiomyoapthy 2/2 chemo:  - stable at this time; follows with cards    -------------------------------------------------------------------------  7. Postmenopausal with letrozole use:  - chemo induced menopause in 2007;reported past DXA to be normal- remote past  - on letrozole which can exacerbate osteoporosis  - DXA nl 08/2017      ICD-10-CM  1. Type 2 diabetes mellitus with hyperglycemia, without long-term current use of  insulin (CMS HCC) E11.65 POCT AMB DCA HEMOGLOBIN A1C  2. Acquired hypothyroidism E03.9  3. Mixed hyperlipidemia E78.2  4. Essential hypertension I10  5. Postmenopausal Z78.0  6. Obesity, Class II, BMI 35-39.9 E66.9  7. Use of letrozole (Femara) Z79.811    Follow up: 3 months per pt pref  Future Appointments  Date Time Provider Department Center  01/23/2019 10:30 AM Dalbert Garnet., MD [id number] CL[facility]I  02/02/2019  3:30 PM DEVICE CHECK [id number]  02/05/2019  3:10 PM Maximino Sarin., MD  [facility] CLINIC  02/05/2019  3:30 PM Irven Coe, MD [facility] CLINIC    Irven Coe, MD    CC: Maximino Sarin., MD

## 2019-02-20 NOTE — Unmapped (Addendum)
Progress Notes by Dalbert Garnet., MD at 02/20/19 1515     Author:  Dalbert Garnet., MD Service:  - Author Type:  Physician    Filed:  02/20/19 1544 Encounter Date:  02/20/2019 Status:  Signed    Editor:  Dalbert Garnet., MD (Physician)       Subjective   Ellen Berger  [birth year]  Chief Complaint   Patient presents with   ??????? Cardiovascular Follow-up   ??????? Heart Failure[EC.1]          Ellen Berger is here for follow-up of her history of severe nonischemic cardiomyopathy with improvement in EF with medical therapy and biventricular pacing.  Her ejection fraction has been in the 40s.  Symptomatic a she has done extremely well also.  She denies   edema orthopnea PND.  No palpitations dizziness lightheadedness or syncope.  Her device has been followed by Dr. Sheral Flow without issues.    She is due to moved to [place] soon where she will be followed up by a team down there.  For medications have been stable for quite some time with good blood pressure heart rates.[EC.2]    Patient's Medications   Current Medications    ALCOHOL SWABS PADS, MEDICATED    Use 1 Each as instructed 4 times daily. Use to test blood sugar.  Diagnosis for use:E11.65       Order Dose: 1 Each    ASPIRIN 81 MG TABLET, DELAYED RELEASE (E.C.)    Take 1 Tab by mouth daily.       Order Dose: 81 mg    ATORVASTATIN (LIPITOR) 40 MG TABLET    TAKE 1 TABLET DAILY       Order Dose: --    CALCIUM ACETATE PO    Take 400 mg by mouth daily.       Order Dose: 400 mg    CARVEDILOL (COREG) 25 MG TABLET    TAKE 1 TABLET TWICE A DAY WITH MEALS       Order Dose: --    CETIRIZINE (ZYRTEC) 10 MG TABLET    TAKE 1 TABLET BY MOUTH EVERY DAY       Order Dose: --    FLUTICASONE PROPIONATE (FLONASE) 50 MCG/ACTUATION NASAL SPRAY    SPRAY 1 SPRAY INTO NOSE DAILY.       Order Dose: 1 Spray    FREESTYLE LITE STRIPS STRIP    USE DAILY       Order Dose: --    FUROSEMIDE (LASIX) 40 MG TABLET    TAKE 1 TABLET DAILY AS NEEDED       Order Dose: --    GABAPENTIN (NEURONTIN) 300 MG  CAPSULE    TAKE 2 CAPSULES (600 MG) EVERY MORNING AND 3 CAPSULES (900 MG) EVERY EVENING       Order Dose: --    LETROZOLE (FEMARA) 2.5 MG PO TAB    Take 2.5 mg by mouth daily.       Order Dose: 2.5 mg    LEVOTHYROXINE (SYNTHROID) 75 MCG TABLET    Take 1 Tab (75 mcg total) by mouth daily.       Order Dose: 75 mcg    METFORMIN (GLUCOPHAGE) 1,000 MG TABLET    TAKE 1 TABLET TWICE A DAY       Order Dose: --    MULTIVITAMIN (MULTIPLE VITAMINS PO)    Take  by mouth daily.       Order Dose: --    ONE TOUCH  DELICA 33 GAUGE MISC    Use 1 Each as instructed 4 times daily. Use to test blood sugar.  Diagnosis for use: :E11.65       Order Dose: 1 Each    ONE TOUCH DELICA KIT    Use 1 Each as instructed 4 times daily. Use to test blood sugar.  Diagnosis for use:E11.65       Order Dose: 1 Each    ONETOUCH VERIO STRIP    Use 1 Strip as instructed 3 times daily. Use to test blood sugar. Diagnosis for use: E 11.9       Order Dose: 1 Strip    POTASSIUM CHLORIDE (KLOR-CON M20) 20 MEQ SR TABLET    Take 1 Tab by mouth daily as needed.       Order Dose: 20 mEq    SACUBITRIL-VALSARTAN (ENTRESTO) 49-51 MG TABLET    Take 1 Tab by mouth 2 times daily.       Order Dose: 1 Tab    SPIRONOLACTONE (ALDACTONE) 25 MG TABLET    TAKE 1 TABLET DAILY       Order Dose: --[EC.1]               ROS   Review of Systems   Constitutional: Negative.    HENT: Negative.    Eyes: Negative.    Respiratory: Negative.    Cardiovascular: Negative.    Gastrointestinal: Negative.    Endocrine: Negative.    Genitourinary: Negative.    Musculoskeletal: Negative.    Allergic/Immunologic: Negative.    Neurological: Positive for numbness.   Hematological: Negative.    Psychiatric/Behavioral: Negative.    All other systems reviewed and are negative.[PW.1]          Objective   Vitals:    02/20/19 1513   BP: 110/74   Pulse: 89   Weight: 239 lb 8 oz (108.6 kg)   Height: 5' 5 (1.651 m)     Body mass index is 39.85 kg/m????.[EC.1]    Physical Exam  Neck:      Vascular: No[PW.1]  JVD[EC.2].   Cardiovascular:      Rate and Rhythm:[PW.1] Normal rate[EC.2] and[PW.1] regular rhythm[EC.2].      Heart sounds:[PW.1] Normal heart sounds[EC.2].[PW.1] No murmur[EC.2]. No[PW.1] friction rub[EC.2]. No[PW.1] gallop[EC.2].    Pulmonary:      Effort: Pulmonary effort is[PW.1] normal[EC.2]. No[PW.1] respiratory distress[EC.2].      Breath sounds: Normal[PW.1] breath sounds[EC.2].           IMPRESSION:[PW.1]      ICD-10-CM    1. CHF NYHA class II, chronic, systolic (CMS HCC) I50.22    2. VT (ventricular tachycardia) (CMS HCC) I47.2[EC.1]        No volume overload, sig HF complaints  No ischemic or arrhythmic complaints[EC.2]    PLAN AND RECOMMENDATION:[PW.1]    Prescription medication review/management provided.[EC.2]      Return if symptoms worsen or fail to improve.[EC.1]    Thank you,    Sincerely,      --------------------------------------------    Francena Hanly MD, [facility]  [facility]  [email or web address]    Office:  [phone] [facility])  Office:    [phone] [place] [facility] [place])  Office:  [phone] [facility])  Office Fax: [phone] ([place])[EC.2]          Electronically signed by Dalbert Garnet., MD on 02/20/19 1544.   Attribution Key   EC.1 - Dalbert Garnet., MD on 02/20/19 1544 EC.2 - Dalbert Garnet., MD on  02/20/19 1543 PW.1 - Louis Meckel on 02/20/19 0865         Revision History      Date/Time User Provider Type Action   > 02/20/19 1544 Dalbert Garnet., MD Physician Sign    02/20/19 1520 Tamala Julian, [facility] Medical Assistant Sign when Signing Visit

## 2019-06-24 ENCOUNTER — Telehealth: Payer: Self-pay

## 2019-06-24 NOTE — Telephone Encounter (Signed)
PATIENT CALLED TO SEE IF HER NOTES WERE HERE IN THE OFFICE, BUT THEY WERE NOT SO I ASKED HER FOR THE INFORMATION OF THE DOCTOR AND SENT A FAXED REQUESTED OVER FOR HER NOTES

## 2019-06-26 ENCOUNTER — Encounter (INDEPENDENT_AMBULATORY_CARE_PROVIDER_SITE_OTHER): Payer: Self-pay

## 2019-06-26 ENCOUNTER — Ambulatory Visit: Payer: 59 | Admitting: Internal Medicine

## 2019-06-26 ENCOUNTER — Other Ambulatory Visit: Payer: Self-pay

## 2019-06-26 ENCOUNTER — Telehealth: Payer: Self-pay | Admitting: Cardiology

## 2019-06-26 ENCOUNTER — Encounter: Payer: Self-pay | Admitting: Internal Medicine

## 2019-06-26 VITALS — BP 110/68 | HR 78 | Ht 65.0 in | Wt 241.0 lb

## 2019-06-26 DIAGNOSIS — I519 Heart disease, unspecified: Secondary | ICD-10-CM | POA: Diagnosis not present

## 2019-06-26 DIAGNOSIS — I428 Other cardiomyopathies: Secondary | ICD-10-CM | POA: Diagnosis not present

## 2019-06-26 DIAGNOSIS — Z95810 Presence of automatic (implantable) cardiac defibrillator: Secondary | ICD-10-CM | POA: Diagnosis not present

## 2019-06-26 DIAGNOSIS — I427 Cardiomyopathy due to drug and external agent: Secondary | ICD-10-CM | POA: Insufficient documentation

## 2019-06-26 LAB — CUP PACEART INCLINIC DEVICE CHECK
Brady Statistic RA Percent Paced: 1 % — CL
Brady Statistic RV Percent Paced: 100 %
Date Time Interrogation Session: 20200918040000
HighPow Impedance: 69 Ohm
Implantable Lead Implant Date: 20110301
Implantable Lead Implant Date: 20110301
Implantable Lead Implant Date: 20110301
Implantable Lead Location: 753858
Implantable Lead Location: 753859
Implantable Lead Location: 753860
Implantable Lead Model: 185
Implantable Lead Model: 350974
Implantable Lead Model: 4592
Implantable Lead Serial Number: 131585
Implantable Lead Serial Number: 28731977
Implantable Lead Serial Number: 327266
Implantable Pulse Generator Implant Date: 20180130
Lead Channel Impedance Value: 587 Ohm
Lead Channel Impedance Value: 638 Ohm
Lead Channel Impedance Value: 679 Ohm
Lead Channel Pacing Threshold Amplitude: 0.6 V
Lead Channel Pacing Threshold Amplitude: 0.6 V
Lead Channel Pacing Threshold Amplitude: 0.9 V
Lead Channel Pacing Threshold Pulse Width: 0.4 ms
Lead Channel Pacing Threshold Pulse Width: 0.4 ms
Lead Channel Pacing Threshold Pulse Width: 0.4 ms
Lead Channel Sensing Intrinsic Amplitude: 11.5 mV
Lead Channel Sensing Intrinsic Amplitude: 12.8 mV
Lead Channel Sensing Intrinsic Amplitude: 2.5 mV
Lead Channel Setting Pacing Amplitude: 1.1 V
Lead Channel Setting Pacing Amplitude: 2 V
Lead Channel Setting Pacing Amplitude: 2 V
Lead Channel Setting Pacing Pulse Width: 0.4 ms
Lead Channel Setting Pacing Pulse Width: 0.4 ms
Lead Channel Setting Sensing Sensitivity: 0.6 mV
Lead Channel Setting Sensing Sensitivity: 1 mV
Pulse Gen Serial Number: 123793

## 2019-06-26 NOTE — Progress Notes (Signed)
Jordan Low, MD: Primary Cardiologist:   Dr Arlyn Dunning is a 58 y.o. female with a h/o bradycardia sp PPM (MDT) by Dr Leonia Reeves who presents today to establish care in the Electrophysiology device clinic.   The patient reports doing very well since having a pacemaker implanted and remains very active despite her age.   Today, she  denies symptoms of palpitations, chest pain, shortness of breath, orthopnea, PND, lower extremity edema, dizziness, presyncope, syncope, or neurologic sequela.  The patientis tolerating medications without difficulties and is otherwise without complaint today.   Past Medical History:  Diagnosis Date  . Breast cancer ([place])    in remission  . Chronic systolic dysfunction of left ventricle   . Morbid obesity with BMI of 40.0-44.9, adult ([place])   . Nonischemic cardiomyopathy [facility])    Past Surgical History:  Procedure Laterality Date  . ICD IMPLANT  11/06/2016   Boston Scientific Dynagen BiV ICD implanted for primary prevention    Social History   Socioeconomic History  . Marital status: Unknown    Spouse name: Not on file  . Number of children: Not on file  . Years of education: Not on file  . Highest education level: Not on file  Occupational History  . Not on file  Social Needs  . Financial resource strain: Not on file  . Food insecurity    Worry: Not on file    Inability: Not on file  . Transportation needs    Medical: Not on file    Non-medical: Not on file  Tobacco Use  . Smoking status: Never Smoker  . Smokeless tobacco: Never Used  Substance and Sexual Activity  . Alcohol use: Not Currently  . Drug use: Not on file  . Sexual activity: Not on file  Lifestyle  . Physical activity    Days per week: Not on file    Minutes per session: Not on file  . Stress: Not on file  Relationships  . Social Herbalist on phone: Not on file    Gets together: Not on file    Attends religious service: Not on file    Active  member of club or organization: Not on file    Attends meetings of clubs or organizations: Not on file    Relationship status: Not on file  . Intimate partner violence    Fear of current or ex partner: Not on file    Emotionally abused: Not on file    Physically abused: Not on file    Forced sexual activity: Not on file  Other Topics Concern  . Not on file  Social History Narrative   Lives in [place] from [place]    No family history on file.  No Known Allergies  Current Outpatient Medications  Medication Sig Dispense Refill  . acetaminophen (TYLENOL) 500 MG tablet Take 500 mg by mouth every 6 (six) hours as needed.    Marland Kitchen aspirin EC 81 MG tablet Take 81 mg by mouth daily.    Marland Kitchen atorvastatin (LIPITOR) 40 MG tablet Take 40 mg by mouth daily.    . Calcium Acetate, Phos Binder, (CALCIUM ACETATE PO) Take 400 mg by mouth daily.    . carvedilol (COREG) 25 MG tablet Take 25 mg by mouth 2 (two) times daily with a meal.    . cetirizine (ZYRTEC) 10 MG chewable tablet Chew 10 mg by mouth daily.    . fluticasone (VERAMYST) 27.5  MCG/SPRAY nasal spray Place 1 spray into the nose daily.    . furosemide (LASIX) 40 MG tablet Take 40 mg by mouth daily.    Marland Kitchen gabapentin (NEURONTIN) 300 MG capsule Take 300 mg by mouth 2 (two) times daily.    Marland Kitchen glucose blood (FREESTYLE LITE) test strip 1 each by Other route as needed for other. Use as instructed    . letrozole (FEMARA) 2.5 MG tablet Take 2.5 mg by mouth daily.    Marland Kitchen levothyroxine (SYNTHROID) 75 MCG tablet Take 75 mcg by mouth daily before breakfast.    . metFORMIN (GLUCOPHAGE) 1000 MG tablet Take 1,000 mg by mouth 2 (two) times daily with a meal.    . Multiple Vitamin (MULTIVITAMIN) tablet Take 1 tablet by mouth daily.    . potassium chloride SA (K-DUR) 20 MEQ tablet Take 20 mEq by mouth 2 (two) times daily.    . sacubitril-valsartan (ENTRESTO) 49-51 MG Take 1 tablet by mouth 2 (two) times daily.    Marland Kitchen spironolactone (ALDACTONE) 25 MG tablet  Take 25 mg by mouth daily.     No current facility-administered medications for this visit.     ROS- all systems are reviewed and negative except as per HPI  Physical Exam: Vitals:   06/26/19 1220  BP: 110/68  Pulse: 78  SpO2: 98%  Weight: 241 lb (109.3 kg)  Height: 5\' 5"  (1.651 m)    GEN- The patient is well appearing, alert and oriented x 3 today.   Head- normocephalic, atraumatic Eyes-  Sclera clear, conjunctiva pink Ears- hearing intact Oropharynx- clear Neck- supple, no JVP Lymph- no cervical lymphadenopathy Lungs- Clear to ausculation bilaterally, normal work of breathing Chest- ICD pocket is well healed Heart- Regular rate and rhythm, no murmurs, rubs or gallops, PMI not laterally displaced GI- soft, NT, ND, + BS Extremities- no clubbing, cyanosis, or edema MS- no significant deformity or atrophy Skin- no rash or lesion Psych- euthymic mood, full affect Neuro- strength and sensation are intact  ICD interrogation- reviewed in detail today,  See PACEART report  Assessment and Plan:   1. Nonischemic CM/ chronic systolic dysfunction EF has improved but not fully recovered with CRT She is doing reasonably well Normal device function  We will enroll in the device clinic and follow remotely  I will refer to Dr Harrell Gave for general cardiology care  Return to see EP APP in a year [place]att[place]  Thompson Grayer MD, [place] 06/29/2019 8:19 PM

## 2019-06-26 NOTE — Patient Instructions (Signed)
Medication Instructions:  Your physician recommends that you continue on your current medications as directed. Please refer to the Current Medication list given to you today.  Labwork: None ordered.  Testing/Procedures: None ordered.  Follow-Up: Your physician wants you to follow-up in: one year with Dr. Rayann Heman.   You will receive a reminder letter in the mail two months in advance. If you don't receive a letter, please call our office to schedule the follow-up appointment.  Remote monitoring is used to monitor your ICD from home. This monitoring reduces the number of office visits required to check your device to one time per year. It allows [place] to keep an eye on the functioning of your device to ensure it is working properly. You are scheduled for a device check from home on 09/25/2019. You may send your transmission at any time that day. If you have a wireless device, the transmission will be sent automatically. After your physician reviews your transmission, you will receive a postcard with your next transmission date.  Any Other Special Instructions Will Be Listed Below (If Applicable).  If you need a refill on your cardiac medications before your next appointment, please call your pharmacy.

## 2019-06-26 NOTE — Telephone Encounter (Signed)
Called patient, but, VM was full and could not leave a msg.  °

## 2019-06-29 ENCOUNTER — Encounter: Payer: Self-pay | Admitting: Internal Medicine

## 2019-07-02 NOTE — Addendum Note (Signed)
Addended by: Rose Phi on: 07/02/2019 08:00 AM   Modules accepted: Orders

## 2019-07-06 ENCOUNTER — Telehealth: Payer: Self-pay | Admitting: Oncology

## 2019-07-06 NOTE — Telephone Encounter (Signed)
Received a new patient referral from Dr. Lysle Rubens for Jordan Daugherty to establish care for hx of breast cancer. Ms. Pol has been cld and scheduled to see Dr. Jana Hakim on 10/13 at 4pm. She's been made aware to arrive 15 minutes early.

## 2019-07-07 ENCOUNTER — Other Ambulatory Visit: Payer: Self-pay | Admitting: Internal Medicine

## 2019-07-08 ENCOUNTER — Telehealth: Payer: Self-pay | Admitting: Cardiology

## 2019-07-08 NOTE — Telephone Encounter (Signed)
LVM for patient to call and schedule new patient appointment with Dr. Christopher. °

## 2019-07-20 NOTE — Progress Notes (Signed)
[facility]  [phone] [phone]    ID: Jordan Daugherty DOB: [birth year]  MR#: [id number]  HUD#:[id number]  Patient Care Team: Wenda Low, MD as PCP - General (Internal Medicine) Rockwell Germany, RN as Oncology Nurse Navigator Mauro Kaufmann, RN as Oncology Nurse Navigator Dayleen Beske, Virgie Dad, MD as Consulting Physician (Medical Oncology) Thompson Grayer, MD as Consulting Physician (Cardiology) Buford Dresser, MD as Consulting Physician (Cardiology) Christophe Louis, MD as Consulting Physician (Obstetrics and Gynecology) OTHER MD:   CHIEF COMPLAINT: Estrogen receptor positive breast cancer  CURRENT TREATMENT: letrozole   HISTORY OF CURRENT ILLNESS: Jordan Daugherty was referred here by Dr. Wenda Low at [facility] for her history of left breast cancer. She was followed by Dr. Luciano Cutter Ward at [facility]  Unfortunately we have not been able to access the records directly, but from the patient's story, I gather that at age 8 she had a T1-2 N1, anatomic stage IIB invasive breast cancer, most likely ductal; status post left lumpectomy and axillary lymph node dissection August 2007. She recalls having 2 out of 7 left axillary lymph nodes positive.  The tumor was estrogen and progesterone receptor positive, HER-2 nonamplified.  She was treated with adjuvant chemotherapy which from her details appears to have been cyclophosphamide and doxorubicin in dose dense fashion x4 followed by paclitaxel ("and another drug") again in dose dense fashion x4.  She then received adjuvant radiation for 32 treatments and tamoxifen for 7 years, and letrozole since 2015.  She has tolerated letrozole well except for some hot flashes.   Jordan Daugherty's last bone density screening on 09/05/2017, showed a T-score of 2.2, which is considered normal.    Her subsequent history says detailed below   INTERVAL HISTORY: Jordan Daugherty was evaluated in the breast cancer clinic on  07/21/2019.  REVIEW OF SYSTEMS: Nakenya denies unusual headaches, visual changes, nausea, vomiting, stiff neck, dizziness, or gait imbalance. There has been no cough, phlegm production, or pleurisy, no chest pain or pressure, and no change in bowel or bladder habits. The patient denies fever, rash, bleeding, unexplained fatigue or unexplained weight loss.  She exercises by walking, usually approximately a mile, 3 times a week.  A detailed review of systems was otherwise entirely negative.   PAST MEDICAL HISTORY: Past Medical History:  Diagnosis Date  . Breast cancer ([place])    in remission  . Chronic systolic dysfunction of left ventricle   . Morbid obesity with BMI of 40.0-44.9, adult ([place])   . Nonischemic cardiomyopathy ([facility])   Diabetes mellitus, hypothyroidism, hypercholesterolemia   PAST SURGICAL HISTORY: Past Surgical History:  Procedure Laterality Date  . ICD IMPLANT  11/06/2016   [employer] Dynagen BiV ICD implanted for primary prevention  Status post left lumpectomy and axillary exploration, status post right oophorectomy   FAMILY HISTORY: No family history on file. Jordan Daugherty's father is 75 years old as of October 2020. Patients' mother is 18 years old as of October 2020. The patient has 4 brothers and no sisters. Patient denies anyone in her family having breast, ovarian, prostate, or pancreatic cancer.  A paternal grandmother had uterine cancer at age 43   GYNECOLOGIC HISTORY:  No LMP recorded. Menarche: 58 years old GX P 0.  The patient had 13 pregnancies, all stillborn, usually ending in the fourth or fifth month. 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  Her husband Jordan Daugherty works in Engineer, technical sales.  At home is just the 2 of them, with no pets.  The patient attends a  nondenominatinal church [employer] stage [employer])   ADVANCED DIRECTIVES: In the absence of any documents to the contrary the patient's husband is her healthcare power of attorney   HEALTH MAINTENANCE: Social History   Tobacco Use  . Smoking status: Never Smoker  . Smokeless tobacco: Never Used  Substance Use Topics  . Alcohol use: Not Currently  . Drug use: Not on file    Colonoscopy: 08/07/2012  PAP: 118  Bone density: 09/05/2017; 2.2 normal Mammography: Due  Allergies  Allergen Reactions  . Pregabalin     MAKES HER MEAN  . Ramipril Other (See Comments)    cough    Current Outpatient Medications  Medication Sig Dispense Refill  . aspirin EC 81 MG tablet Take 81 mg by mouth daily.    Marland Kitchen atorvastatin (LIPITOR) 40 MG tablet Take 40 mg by mouth daily.    . Calcium Acetate, Phos Binder, (CALCIUM ACETATE PO) Take 400 mg by mouth daily.    . carvedilol (COREG) 25 MG tablet Take 25 mg by mouth 2 (two) times daily with a meal.    . fluticasone (VERAMYST) 27.5 MCG/SPRAY nasal spray Place 1 spray into the nose daily.    Marland Kitchen gabapentin (NEURONTIN) 300 MG capsule Take 300 mg by mouth 2 (two) times daily.    Marland Kitchen glucose blood (FREESTYLE LITE) test strip 1 each by Other route as needed for other. Use as instructed    . letrozole (FEMARA) 2.5 MG tablet Take 2.5 mg by mouth daily.    Marland Kitchen levothyroxine (SYNTHROID) 75 MCG tablet Take 75 mcg by mouth daily before breakfast.    . metFORMIN (GLUCOPHAGE) 1000 MG tablet Take 1,000 mg by mouth 2 (two) times daily with a meal.    . Multiple Vitamin (MULTIVITAMIN) tablet Take 1 tablet by mouth daily.    . sacubitril-valsartan (ENTRESTO) 49-51 MG Take 1 tablet by mouth 2 (two) times daily.    Marland Kitchen spironolactone (ALDACTONE) 25 MG tablet Take 25 mg by mouth daily.    Marland Kitchen acetaminophen (TYLENOL) 500 MG tablet Take 500 mg by mouth every 6 (six) hours as needed.    . cetirizine (ZYRTEC) 10 MG chewable tablet Chew 10 mg by mouth daily.    . furosemide (LASIX) 40 MG tablet Take  40 mg by mouth daily.    . potassium chloride SA (K-DUR) 20 MEQ tablet Take 20 mEq by mouth 2 (two) times daily.     No current facility-administered medications for this visit.      OBJECTIVE: Morbidly obese white woman in no acute distress  Vitals:   07/21/19 1549  BP: 90/63  Pulse: 83  Resp: 18  Temp: 98.3 F (36.8 C)  SpO2: 98%   Wt Readings from Last 3 Encounters:  07/21/19 243 lb 12.8 oz (110.6 kg)  06/26/19 241 lb (109.3 kg)   Body mass index is 40.57 kg/m.    ECOG FS:1 - Symptomatic but completely ambulatory  Ocular: Sclerae unicteric, pupils round and equal Ear-nose-throat: Oropharynx clear and moist Lymphatic: No cervical or supraclavicular adenopathy Lungs no rales or rhonchi Heart regular rate and rhythm Abd soft, nontender, positive bowel sounds MSK no focal spinal tenderness, no joint edema Neuro: non-focal, well-oriented, appropriate affect Breasts: The right breast is unremarkable.  The left breast is status  post lumpectomy and axillary exploration.  There is a "pin" marking the area of where her tumor was removed.  There is no evidence of local recurrence.  Both axillae are benign.   LAB RESULTS:  CMP  No results found for: NA, K, CL, CO2, GLUCOSE, BUN, CREATININE, CALCIUM, PROT, ALBUMIN, AST, ALT, ALKPHOS, BILITOT, GFRNONAA, GFRAA  No results found for: TOTALPROTELP, ALBUMINELP, A1GS, A2GS, BETS, BETA2SER, GAMS, MSPIKE, SPEI  No results found for: KPAFRELGTCHN, LAMBDASER, KAPLAMBRATIO  No results found for: WBC, NEUTROABS, HGB, HCT, MCV, PLT  No results found for: LABCA2  No components found for: EHOZYY482  No results for input(s): INR in the last 168 hours.  No results found for: LABCA2  No results found for: NOI370  No results found for: WUG891  No results found for: QXI503  No results found for: CA2729  No components found for: HGQUANT  No results found for: CEA1 / No results found for: CEA1   No results found for: AFPTUMOR   No results found for: CHROMOGRNA  No results found for: PSA1  No visits with results within 3 Day(s) from this visit.  Latest known visit with results is:  Office Visit on 06/26/2019  Component Date Value Ref Range Status  . Date Time Interrogation Session 06/26/2019 [id number]   Final  . Pulse Generator Manufacturer 06/26/2019 BOST   Final  . Pulse Gen Model 06/26/2019 G151 DYNAGEN CRT-D   Final  . Pulse Gen Serial Number 06/26/2019 [id number]   Final  . Clinic Name 06/26/2019 [facility]   Final  . Implantable Pulse Generator Type 06/26/2019 Cardiac Resynch Therapy Defibulator   Final  . Implantable Pulse Generator Implan* 06/26/2019 [id number]   Final  . Implantable Lead Manufacturer 06/26/2019 BITR   Final  . Implantable Lead Model 06/26/2019 [id number] Setrox S 53   Final  . Implantable Lead Serial Number 06/26/2019 [id number]   Final  . Implantable Lead Implant Date 06/26/2019 [id number]   Final  . Implantable Lead Location Detail 1 06/26/2019 UNKNOWN   Final  . Implantable Lead Location 06/26/2019 [id number]   Final  . Implantable Lead Manufacturer 06/26/2019 GUIC   Final  . Implantable Lead Model 06/26/2019 0185 Endotak Reliance G   Final  . Implantable Lead Serial Number 06/26/2019 [id number]   Final  . Implantable Lead Implant Date 06/26/2019 [id number]   Final  . Implantable Lead Location Detail 1 06/26/2019 UNKNOWN   Final  . Implantable Lead Location 06/26/2019 [id number]   Final  . Implantable Lead Manufacturer 06/26/2019 GUIC   Final  . Implantable Lead Model 06/26/2019 4592 Acuity Spiral   Final  . Implantable Lead Serial Number 06/26/2019 [id number]   Final  . Implantable Lead Implant Date 06/26/2019 [id number]   Final  . Implantable Lead Location Detail 1 06/26/2019 UNKNOWN   Final  . Implantable Lead Location 06/26/2019 [id number]   Final  . Lead Channel Setting Sensing Sensi* 06/26/2019 0.6  mV Final  . Lead Channel Setting Sensing Adapt* 06/26/2019 Adaptive Sensing   Final  . Lead Channel  Setting Sensing Sensi* 06/26/2019 1.0  mV Final  . Lead Channel Setting Sensing Adapt* 06/26/2019 Adaptive Sensing   Final  . Lead Channel Setting Pacing Amplit* 06/26/2019 2.0  V Final  . Lead Channel Setting Pacing Pulse * 06/26/2019 0.4  ms Final  . Lead Channel Setting Pacing Amplit* 06/26/2019 2.0  V Final  . Lead Channel Setting Pacing Pulse * 06/26/2019 0.4  ms Final  . Lead Channel Setting Pacing Amplit*  06/26/2019 1.1  V Final  . Lead Channel Setting Pacing Captur* 06/26/2019 Fixed Pacing   Final  . Lead Channel Impedance Value 06/26/2019 679.0  ohm Final  . Lead Channel Sensing Intrinsic Amp* 06/26/2019 12.8  mV Final  . Lead Channel Pacing Threshold Ampl* 06/26/2019 0.6000  V Final  . Lead Channel Pacing Threshold Puls* 06/26/2019 0.4  ms Final  . Lead Channel Impedance Value 06/26/2019 638.0  ohm Final  . Lead Channel Sensing Intrinsic Amp* 06/26/2019 2.5  mV Final  . Lead Channel Pacing Threshold Ampl* 06/26/2019 0.6000  V Final  . Lead Channel Pacing Threshold Puls* 06/26/2019 0.4  ms Final  . Lead Channel Impedance Value 06/26/2019 587.0  ohm Final  . Lead Channel Sensing Intrinsic Amp* 06/26/2019 11.5  mV Final  . Lead Channel Pacing Threshold Ampl* 06/26/2019 0.9000  V Final  . Lead Channel Pacing Threshold Puls* 06/26/2019 0.4  ms Final  . HighPow Impedance 06/26/2019 69.0  ohm Final  . Battery Status 06/26/2019 BOS   Final  . Loletha Grayer Statistic RA Percent Paced 06/26/2019 1* % Final  . Brady Statistic RV Percent Paced 06/26/2019 100  % Final  . Eval Rhythm 06/26/2019 SR 84   Final    (this displays the last labs from the last 3 days)  No results found for: TOTALPROTELP, ALBUMINELP, A1GS, A2GS, BETS, BETA2SER, GAMS, MSPIKE, SPEI (this displays SPEP labs)  No results found for: KPAFRELGTCHN, LAMBDASER, KAPLAMBRATIO (kappa/lambda light chains)  No results found for: HGBA, HGBA2QUANT, HGBFQUANT, HGBSQUAN (Hemoglobinopathy evaluation)   No results found for: LDH  No  results found for: IRON, TIBC, IRONPCTSAT (Iron and TIBC)  No results found for: FERRITIN  Urinalysis No results found for: COLORURINE, APPEARANCEUR, LABSPEC, PHURINE, GLUCOSEU, HGBUR, BILIRUBINUR, KETONESUR, PROTEINUR, UROBILINOGEN, NITRITE, LEUKOCYTESUR   STUDIES:  No results found.   ELIGIBLE FOR AVAILABLE RESEARCH PROTOCOL: no   ASSESSMENT: 58 y.o. [place], [place] woman status post left lumpectomy and axillary lymph node dissection August 2007 for a T1-2, N1 invasive breast cancer, estrogen and progesterone receptor positive, HER-2 not amplified  (a) 2 out of 7 lymph nodes were involved  (1) status post adjuvant chemotherapy with cyclophosphamide/doxorubicin in dose dense fashion x4 followed by paclitaxel ("and another drug") in dose dense fashion x4  (2) adjuvant radiation given for total of 32 treatments after chemotherapy  (3) antiestrogens included tamoxifen for 7 years, then letrozole starting 2015, stopping October 2020  (4) genetics testing pending  (5) history of multiple late miscarriages, possible antiphospholipid antibody syndrome  PLAN: I spent approximately 50 minutes face to face with [place] with more than 50% of that time spent in counseling and coordination of care. Specifically we reviewed the biology of the patient's diagnosis and the specifics of her situation.  While I do not have the actual documents detailing her diagnosis and treatment, she was able to give me sufficient information that we could reconstruct her past medical history as far as breast cancer is concerned.  That is detailed above.  She was told to stay on letrozole "lifetime".  We actually have data that 7 years of letrozole is as good as 10.  Beyond 7 years there is only cost and side effects.  She has had 7 years of tamoxifen and 5 years of letrozole.  Certainly we have no data that continuing antiestrogens would further lower her breast cancer risk.  She has done an excellent job at lowering  her breast cancer risk as much as it can be lowered.  Accordingly I suggested she go off letrozole at this point.  I expect that she will feel a little bit better over the next couple of months off that medication.  When she sees me again in 3 months from now if she is uncomfortable I will be glad to restart the letrozole but she understands my recommendation is to stay off it  She was 44 at the time of diagnosis and meets criteria for genetics testing.  We will obtain the blood work for that sometime next week and she will meet with our genetic counselors as appropriate.  Given her pregnancy history I am going to recheck her for the antiphospholipid syndrome.  She understands is greatly increases the risk of clotting and at the next visit we would then discuss possible strategies to avoid that complication  I have set her up for mammography sometime this month at the breast center  She knows to call for any other issue that may develop before the next visit.  Kentravious Lipford, Virgie Dad, MD  07/21/19 5:33 PM Medical Oncology and Hematology [facility] [place], [place] [address] Tel. [phone]    Fax. [phone]   I, Jacqualyn Posey am acting as a Education administrator for Chauncey Cruel, MD.   I, Lurline Del MD, have reviewed the above documentation for accuracy and completeness, and I agree with the above.

## 2019-07-21 ENCOUNTER — Other Ambulatory Visit: Payer: Self-pay | Admitting: *Deleted

## 2019-07-21 ENCOUNTER — Inpatient Hospital Stay: Payer: 59 | Attending: Oncology | Admitting: Oncology

## 2019-07-21 ENCOUNTER — Other Ambulatory Visit: Payer: Self-pay

## 2019-07-21 DIAGNOSIS — Z6841 Body Mass Index (BMI) 40.0 and over, adult: Secondary | ICD-10-CM

## 2019-07-21 DIAGNOSIS — Z170 Estrogen receptor positive status [ER+]: Secondary | ICD-10-CM | POA: Diagnosis not present

## 2019-07-21 DIAGNOSIS — Z9221 Personal history of antineoplastic chemotherapy: Secondary | ICD-10-CM | POA: Insufficient documentation

## 2019-07-21 DIAGNOSIS — D6861 Antiphospholipid syndrome: Secondary | ICD-10-CM | POA: Insufficient documentation

## 2019-07-21 DIAGNOSIS — Z79811 Long term (current) use of aromatase inhibitors: Secondary | ICD-10-CM | POA: Diagnosis not present

## 2019-07-21 DIAGNOSIS — C50912 Malignant neoplasm of unspecified site of left female breast: Secondary | ICD-10-CM | POA: Insufficient documentation

## 2019-07-21 DIAGNOSIS — Z853 Personal history of malignant neoplasm of breast: Secondary | ICD-10-CM | POA: Insufficient documentation

## 2019-07-21 DIAGNOSIS — C50412 Malignant neoplasm of upper-outer quadrant of left female breast: Secondary | ICD-10-CM | POA: Insufficient documentation

## 2019-07-22 ENCOUNTER — Telehealth: Payer: Self-pay | Admitting: Oncology

## 2019-07-22 NOTE — Telephone Encounter (Signed)
I left a message regarding schedule  

## 2019-07-23 ENCOUNTER — Other Ambulatory Visit: Payer: Self-pay | Admitting: Genetic Counselor

## 2019-07-23 DIAGNOSIS — C50412 Malignant neoplasm of upper-outer quadrant of left female breast: Secondary | ICD-10-CM

## 2019-07-23 DIAGNOSIS — Z170 Estrogen receptor positive status [ER+]: Secondary | ICD-10-CM

## 2019-07-30 ENCOUNTER — Inpatient Hospital Stay: Payer: 59

## 2019-07-30 ENCOUNTER — Other Ambulatory Visit: Payer: Self-pay | Admitting: Oncology

## 2019-07-30 ENCOUNTER — Telehealth: Payer: Self-pay | Admitting: Oncology

## 2019-07-30 NOTE — Telephone Encounter (Signed)
Returned patient's phone call regarding rescheduling an appointment, per patient's request 10/22 lab appointment has moved to 01/04 due to patient's insurance.   Message to provider.

## 2019-07-30 NOTE — Progress Notes (Signed)
We received [facility] from [facility] and per his notes she had a T1 N1 M0 estrogen receptor positive HER-2 negative left breast cancer diagnosed August 2008 and underwent lumpectomy and axillary sentinel lymph node sampling, then received adjuvant Adriamycin and Cytoxan followed by Taxol followed by radiation and then 5 years of tamoxifen.  He then started her on letrozole which he planned to discontinue 2023.  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  T

## 2019-08-02 NOTE — Telephone Encounter (Signed)
Not a problem.

## 2019-08-05 ENCOUNTER — Other Ambulatory Visit: Payer: Self-pay

## 2019-08-05 ENCOUNTER — Ambulatory Visit: Payer: 59 | Admitting: Cardiology

## 2019-08-05 VITALS — BP 115/73 | HR 87 | Ht 65.0 in | Wt 243.0 lb

## 2019-08-05 DIAGNOSIS — T451X5A Adverse effect of antineoplastic and immunosuppressive drugs, initial encounter: Secondary | ICD-10-CM

## 2019-08-05 DIAGNOSIS — I5042 Chronic combined systolic (congestive) and diastolic (congestive) heart failure: Secondary | ICD-10-CM

## 2019-08-05 DIAGNOSIS — Z95810 Presence of automatic (implantable) cardiac defibrillator: Secondary | ICD-10-CM

## 2019-08-05 DIAGNOSIS — I427 Cardiomyopathy due to drug and external agent: Secondary | ICD-10-CM | POA: Diagnosis not present

## 2019-08-05 DIAGNOSIS — Z7189 Other specified counseling: Secondary | ICD-10-CM

## 2019-08-05 DIAGNOSIS — I428 Other cardiomyopathies: Secondary | ICD-10-CM | POA: Diagnosis not present

## 2019-08-05 MED ORDER — JARDIANCE 10 MG PO TABS: 10.0000 mg | ORAL_TABLET | Freq: Every day | ORAL | 11 refills | Status: DC

## 2019-08-05 NOTE — Patient Instructions (Addendum)
Medication Instructions:  Start taking 10mg  Jardiance daily.   If you need a refill on your cardiac medications before your next appointment, please call your pharmacy.   Lab work: NONE  Testing/Procedures: NONE  Follow-Up: At [facility], you and your health needs are our priority.  As part of our continuing mission to provide you with exceptional heart care, we have created designated Provider Care Teams.  These Care Teams include your primary Cardiologist (physician) and Advanced Practice Providers (APPs -  Physician Assistants and Nurse Practitioners) who all work together to provide you with the care you need, when you need it. You may see Dr Louretta Parma or one of the following Advanced Practice Providers on your designated Care Team:    Rosaria Ferries, PA-C  Jory Sims, DNP, ANP  Cadence Kathlen Mody, NP Your physician wants you to follow-up in: 4 weeks. You will receive a reminder letter in the mail two months in advance. If you don't receive a letter, please call our office to schedule the follow-up appointment.

## 2019-08-05 NOTE — Progress Notes (Signed)
Cardiology Office Note:    Date:  08/05/2019   ID:  [name], DOB July 05, 1961, MRN [id number]  PCP:  Wenda Low, MD  Cardiologist:  Buford Dresser, MD  Referring MD: Thompson Grayer, MD   CC: new patient evaluation  History of Present Illness:    Jordan Daugherty is a 58 y.o. female with a hx of Nonischemic cardiomyopathy who is seen as a new patient to me at the request of Thompson Grayer, MD for the evaluation and management of nonischemic cardiomyopathy. She was previously followed by Dr. Boyd Kerbs in [place].  Records reviewed in Care Everywhere, highlights below: Echo 07/10/2018 - Left ventricle: The cavity size is normal. Wall thickness is  normal. The estimated ejection fraction was in the range of 40%  to 45%. Doppler parameters are consistent with abnormal left  ventricular relaxation (grade 1 diastolic dysfunction). The  global longitudinal strain was -8.54%. - Right ventricle: Pacer wire noted in right ventricle. - Inferior vena cava: The vessel was normal in size; the  respirophasic diameter changes were in the normal range (&gt;= 50%);  findings are consistent with normal central venous pressure.  Just moved to [facility] in June 2020, had been living in [place] prior to this. She has a PMH of NICM 2/2 chemotherapy for breast cancer. Initially had fatigue, treated for bronchitis for 6 mos prior to diagnosis of heart failure.   Has done well with watching salt, working on activity, and CRT therapy. Has done really well on entresto but since moving to [place] her copay is $700/month. On entresto 49-51 mg BID. Has enough until December.  Would also like her to be on SGLT2i given her heart failure. Does not take lasix daily, takes as needed  PMH: OSA on CPAP Hypothyroidism Type II diabetes Dyslipidemia  Past Medical History:  Diagnosis Date  . Breast cancer ([place])    in remission  . Chronic systolic dysfunction of left ventricle   . Morbid obesity  with BMI of 40.0-44.9, adult ([place])   . Nonischemic cardiomyopathy [facility])     Past Surgical History:  Procedure Laterality Date  . ICD IMPLANT  11/06/2016   [employer] Dynagen BiV ICD implanted for primary prevention    Current Medications: Current Outpatient Medications on File Prior to Visit  Medication Sig  . acetaminophen (TYLENOL) 500 MG tablet Take 500 mg by mouth every 6 (six) hours as needed.  Marland Kitchen aspirin EC 81 MG tablet Take 81 mg by mouth daily.  Marland Kitchen atorvastatin (LIPITOR) 40 MG tablet Take 40 mg by mouth daily.  . Calcium Acetate, Phos Binder, (CALCIUM ACETATE PO) Take 400 mg by mouth daily.  . carvedilol (COREG) 25 MG tablet Take 25 mg by mouth 2 (two) times daily with a meal.  . cetirizine (ZYRTEC) 10 MG chewable tablet Chew 10 mg by mouth daily.  . fluticasone (VERAMYST) 27.5 MCG/SPRAY nasal spray Place 1 spray into the nose daily.  . furosemide (LASIX) 40 MG tablet Take 40 mg by mouth daily.  Marland Kitchen gabapentin (NEURONTIN) 300 MG capsule Take 300 mg by mouth 2 (two) times daily.  Marland Kitchen glucose blood (FREESTYLE LITE) test strip 1 each by Other route as needed for other. Use as instructed  . letrozole (FEMARA) 2.5 MG tablet Take 2.5 mg by mouth daily.  Marland Kitchen levothyroxine (SYNTHROID) 75 MCG tablet Take 75 mcg by mouth daily before breakfast.  . metFORMIN (GLUCOPHAGE) 1000 MG tablet Take 1,000 mg by mouth 2 (two) times daily with a meal.  . Multiple  Vitamin (MULTIVITAMIN) tablet Take 1 tablet by mouth daily.  . potassium chloride SA (K-DUR) 20 MEQ tablet Take 20 mEq by mouth 2 (two) times daily.  . sacubitril-valsartan (ENTRESTO) 49-51 MG Take 1 tablet by mouth 2 (two) times daily.  Marland Kitchen spironolactone (ALDACTONE) 25 MG tablet Take 25 mg by mouth daily.   No current facility-administered medications on file prior to visit.      Allergies:   Pregabalin and Ramipril   Social History   Tobacco Use  . Smoking status: Never Smoker  . Smokeless tobacco: Never Used  Substance Use Topics   . Alcohol use: Not Currently  . Drug use: Not on file    Family History: family history includes Cancer in her paternal grandmother; Diabetes in her paternal grandfather; Heart disease in her paternal grandfather.  ROS:   Please see the history of present illness.  Additional pertinent ROS: Constitutional: Negative for chills, fever, night sweats, unintentional weight loss  HENT: Negative for ear pain and hearing loss.   Eyes: Negative for loss of vision and eye pain.  Respiratory: Negative for cough, sputum, wheezing.   Cardiovascular: See HPI. Gastrointestinal: Negative for abdominal pain, melena, and hematochezia.  Genitourinary: Negative for dysuria and hematuria.  Musculoskeletal: Negative for falls and myalgias.  Skin: Negative for itching and rash.  Neurological: Negative for focal weakness, focal sensory changes and loss of consciousness.  Endo/Heme/Allergies: Does not bruise/bleed easily.     EKGs/Labs/Other Studies Reviewed:    The following studies were reviewed today: Echo 07/10/2018 (care everywhere) Study Conclusions  - Left ventricle: The cavity size is normal. Wall thickness is  normal. The estimated ejection fraction was in the range of 40%  to 45%. Doppler parameters are consistent with abnormal left  ventricular relaxation (grade 1 diastolic dysfunction). The  global longitudinal strain was -8.54%. - Right ventricle: Pacer wire noted in right ventricle. - Inferior vena cava: The vessel was normal in size; the  respirophasic diameter changes were in the normal range (</= 50%);  findings are consistent with normal central venous pressure.  ------------------------------------------------------------------- Cardiac Anatomy  Left ventricle:  - The cavity size is normal. Wall thickness is normal. The  estimated ejection fraction was in the range of 40% to 45%. The  global longitudinal strain was -8.54%.  - Doppler parameters are consistent with  abnormal left ventricular  relaxation (grade 1 diastolic dysfunction).  Aortic valve:  - The valve was structurally normal. Cusp separation was normal.  Doppler:  - Transvalvular velocity is within the normal range. There is no  stenosis. There was no significant regurgitation.  - The ratio of LVOT to aortic valve peak velocity is 0.52.  AORTA: Aortic root: The aortic root is not dilated. Mitral valve:  - The valve is structurally normal. Leaflet separation was normal.  Doppler:  - Transvalvular velocity is within the normal range. There is no  evidence for stenosis. There was trivial regurgitation.  - The mean diastolic gradient is 21mm Hg.  Left atrium:  - The atrium is normal in size.  Right ventricle:  - The cavity size is normal. Wall thickness is normal. Pacer wire  noted in right ventricle. Systolic function is normal.  Pulmonic valve:  - The valve was structurally normal.  Doppler:  - There was trivial regurgitation.  Tricuspid valve:  - The valve was structurally normal. Leaflet separation was normal.  Doppler:  - Transvalvular velocity is within the normal range. There is no  evidence for stenosis. There was trivial  regurgitation.  Pulmonary artery:  - Not well visualized.  Right atrium:  - The atrium is normal in size.  Pericardium:  - The pericardium was normal in appearance. There is no pericardial  effusion.  Systemic veins: Inferior vena cava:  - The vessel was normal in size; the respirophasic diameter changes  were in the normal range (&gt;= 50%); findings are consistent with  normal central venous pressure. - The internal diameter is 1.2cm. - The proximal internal diameter during inspiration is 0.3cm.  EKG:  EKG is personally reviewed.  The ekg ordered 06/26/19 demonstrates a-sensed, biv-paced rhythm  Recent Labs: No results found for requested labs within last 8760 hours.  Recent Lipid Panel No results found for:  CHOL, TRIG, HDL, CHOLHDL, VLDL, LDLCALC, LDLDIRECT  Physical Exam:    VS:  BP 115/73   Pulse 87   Ht 5\' 5"  (1.651 m)   Wt 243 lb (110.2 kg)   BMI 40.44 kg/m     Wt Readings from Last 3 Encounters:  08/05/19 243 lb (110.2 kg)  07/21/19 243 lb 12.8 oz (110.6 kg)  06/26/19 241 lb (109.3 kg)    GEN: Well nourished, well developed in no acute distress HEENT: Normal, moist mucous membranes NECK: No JVD CARDIAC: regular rhythm, normal S1 and S2, no rubs or gallops. No murmurs. VASCULAR: Radial and DP pulses 2+ bilaterally. No carotid bruits RESPIRATORY:  Clear to auscultation without rales, wheezing or rhonchi  ABDOMEN: Soft, non-tender, non-distended MUSCULOSKELETAL:  Ambulates independently SKIN: Warm and dry, no edema NEUROLOGIC:  Alert and oriented x 3. No focal neuro deficits noted. PSYCHIATRIC:  Normal affect    ASSESSMENT:    1. Nonischemic cardiomyopathy ([place])   2. ICD (implantable cardioverter-defibrillator) in place   3. Chronic combined systolic and diastolic heart failure (H[facility])   4. 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 Cardiac risk counseling   6. Counseling on health promotion and disease prevention    PLAN:    Nonischemic cardiomyopathy: likely 2/2 anthracycline treatment for breast cancer -continue watching salt, working on activity, and CRT therapy -continue entresto but since moving to [place] her copay is $700/month. On entresto 49-51 mg BID. Has enough until December. Reassess at f/u, may need to have PharmD team help [place] with coverage. -discussed SGLT2 inhibitors. Reviewed CV risk reduction as a benefit to medications, as well as weight loss. Reviewed contraindications and side effects to monitor for. We specifically discussed risk of urinary tract/genital infections with SGLT2 inhibitors. Wilder Glade was not covered before but was told that jardiance is covered -will start jardiance today given HF and diabetes. Does not take lasix daily, takes as needed. Not  on insulin. -follows with Dr. Rayann Heman for CRT.  History of breast cancer, in remission, treated with lumpectomy, sentinel lymph node, adriamycin, cytoxan, taxol, radiation. Treated with tamoxifen x 5years, now on letrozole until 2023. -established with Dr. Jana Hakim for monitoring  Cardiac risk counseling and prevention recommendations: -recommend heart healthy/Mediterranean diet, with whole grains, fruits, vegetable, fish, lean meats, nuts, and olive oil. Limit salt. -recommend moderate walking, 3-5 times/week for 30-50 minutes each session. Aim for at least 150 minutes.week. Goal should be pace of 3 miles/hours, or walking 1.5 miles in 30 minutes -recommend avoidance of tobacco products. Avoid excess alcohol.  Plan for follow up: 4 weeks, with labs, to monitor start of SGLT2i  Medication Adjustments/Labs and Tests Ordered: Current medicines are reviewed at length with the patient today.  Concerns regarding medicines are outlined above.  No orders  of the defined types were placed in this encounter.  Meds ordered this encounter  Medications  . empagliflozin (JARDIANCE) 10 MG TABS tablet    Sig: Take 10 mg by mouth daily before breakfast.    Dispense:  30 tablet    Refill:  11    Patient Instructions  Medication Instructions:  Start taking 10mg  Jardiance daily.   If you need a refill on your cardiac medications before your next appointment, please call your pharmacy.   Lab work: NONE  Testing/Procedures: NONE  Follow-Up: At [facility], you and your health needs are our priority.  As part of our continuing mission to provide you with exceptional heart care, we have created designated Provider Care Teams.  These Care Teams include your primary Cardiologist (physician) and Advanced Practice Providers (APPs -  Physician Assistants and Nurse Practitioners) who all work together to provide you with the care you need, when you need it. You may see Dr Louretta Parma or one of the  following Advanced Practice Providers on your designated Care Team:    Rosaria Ferries, PA-C  Jory Sims, DNP, ANP  Cadence Kathlen Mody, NP Your physician wants you to follow-up in: 4 weeks. You will receive a reminder letter in the mail two months in advance. If you don't receive a letter, please call our office to schedule the follow-up appointment.         Signed, Buford Dresser, MD PhD 08/05/2019 8:52 PM    [place]

## 2019-08-09 ENCOUNTER — Encounter: Payer: Self-pay | Admitting: Cardiology

## 2019-08-09 DIAGNOSIS — I5042 Chronic combined systolic (congestive) and diastolic (congestive) heart failure: Secondary | ICD-10-CM | POA: Insufficient documentation

## 2019-09-15 ENCOUNTER — Ambulatory Visit: Payer: 59 | Admitting: Cardiology

## 2019-09-15 ENCOUNTER — Other Ambulatory Visit: Payer: Self-pay

## 2019-09-15 ENCOUNTER — Encounter: Payer: Self-pay | Admitting: Cardiology

## 2019-09-15 VITALS — BP 96/70 | HR 74 | Temp 97.1°F | Ht 65.0 in | Wt 243.0 lb

## 2019-09-15 DIAGNOSIS — T451X5A Adverse effect of antineoplastic and immunosuppressive drugs, initial encounter: Secondary | ICD-10-CM

## 2019-09-15 DIAGNOSIS — Z8249 Family history of ischemic heart disease and other diseases of the circulatory system: Secondary | ICD-10-CM

## 2019-09-15 DIAGNOSIS — I427 Cardiomyopathy due to drug and external agent: Secondary | ICD-10-CM | POA: Diagnosis not present

## 2019-09-15 DIAGNOSIS — Z79899 Other long term (current) drug therapy: Secondary | ICD-10-CM | POA: Diagnosis not present

## 2019-09-15 DIAGNOSIS — I519 Heart disease, unspecified: Secondary | ICD-10-CM | POA: Diagnosis not present

## 2019-09-15 DIAGNOSIS — I428 Other cardiomyopathies: Secondary | ICD-10-CM | POA: Diagnosis not present

## 2019-09-15 DIAGNOSIS — Z7189 Other specified counseling: Secondary | ICD-10-CM

## 2019-09-15 DIAGNOSIS — Z833 Family history of diabetes mellitus: Secondary | ICD-10-CM

## 2019-09-15 DIAGNOSIS — Z853 Personal history of malignant neoplasm of breast: Secondary | ICD-10-CM

## 2019-09-15 MED ORDER — JARDIANCE 10 MG PO TABS: 10.0000 mg | ORAL_TABLET | Freq: Every day | ORAL | 3 refills | Status: DC

## 2019-09-15 NOTE — Patient Instructions (Addendum)
Medication Instructions:  Your Physician recommend you continue on your current medication as directed.     *If you need a refill on your cardiac medications before your next appointment, please call your pharmacy*  Lab Work: Your physician recommends that you return for lab work today ( BMP)  If you have labs (blood work) drawn today and your tests are completely normal, you will receive your results only by: Marland Kitchen MyChart Message (if you have MyChart) OR . A paper copy in the mail If you have any lab test that is abnormal or we need to change your treatment, we will call you to review the results.  Testing/Procedures: None  Follow-Up: At [facility], you and your health needs are our priority.  As part of our continuing mission to provide you with exceptional heart care, we have created designated Provider Care Teams.  These Care Teams include your primary Cardiologist (physician) and Advanced Practice Providers (APPs -  Physician Assistants and Nurse Practitioners) who all work together to provide you with the care you need, when you need it.  Your next appointment:   6 month(s)  The format for your next appointment:   In Person  Provider:   Buford Dresser, MD

## 2019-09-15 NOTE — Progress Notes (Signed)
Cardiology Office Note:    Date:  09/15/2019   ID:  Jordan Daugherty, DOB Jun 06, 1961, MRN [id number]  PCP:  Wenda Low, MD  Cardiologist:  Buford Dresser, MD  Referring MD: Wenda Low, MD   CC: follow up SGLT2i initiation  History of Present Illness:    Jordan Daugherty is a 58 y.o. female with a hx of Nonischemic cardiomyopathy who is seen in follow up today. I initially saw her 08/05/19 as a new patient to me at the request of Wenda Low, MD for the evaluation and management of nonischemic cardiomyopathy. She was previously followed by Dr. Boyd Kerbs in [place].  Cardiac history: moved to [facility] in June 2020, had been living in [place] prior to this. She has a PMH of NICM 2/2 chemotherapy for breast cancer. Initially had fatigue, treated for bronchitis for 6 mos prior to diagnosis of heart failure. Has done well with watching salt,  Increasing activity, and CRT therapy. Has done really well on entresto but since moving to [place] her copay is $700/month. Started on jardiance, takes lasix PRN  Today: Has noticed mild increased joint pain left elbow, both shoulders (L>R), left knee >R knee. Thinks it is worse with starting Jardiance. Mild, not limiting. We reviewed the data on side effects for this. No yeast or urinary tract infections.  Walks 3-5 times/week. Mild chest tightness, not exertional, intermittent. No clear triggers, no associated symptoms.   Denies shortness of breath at rest or with normal exertion. No PND, orthopnea, LE edema or unexpected weight gain. No syncope or palpitations.  Past Medical History:  Diagnosis Date  . Breast cancer ([place])    in remission  . Chronic systolic dysfunction of left ventricle   . Dyslipidemia   . Hypothyroidism   . Morbid obesity with BMI of 40.0-44.9, adult ([place])   . Nonischemic cardiomyopathy ([place])   . OSA on CPAP   . Type II diabetes mellitus ([place])     Past Surgical History:  Procedure Laterality Date  . ICD  IMPLANT  11/06/2016   [employer] Dynagen BiV ICD implanted for primary prevention    Current Medications: Current Outpatient Medications on File Prior to Visit  Medication Sig  . acetaminophen (TYLENOL) 500 MG tablet Take 500 mg by mouth every 6 (six) hours as needed.  Marland Kitchen aspirin EC 81 MG tablet Take 81 mg by mouth daily.  Marland Kitchen atorvastatin (LIPITOR) 40 MG tablet Take 40 mg by mouth daily.  . Calcium Acetate, Phos Binder, (CALCIUM ACETATE PO) Take 400 mg by mouth daily.  . carvedilol (COREG) 25 MG tablet Take 25 mg by mouth 2 (two) times daily with a meal.  . cetirizine (ZYRTEC) 10 MG chewable tablet Chew 10 mg by mouth daily.  . fluticasone (VERAMYST) 27.5 MCG/SPRAY nasal spray Place 1 spray into the nose daily.  . furosemide (LASIX) 40 MG tablet Take 40 mg by mouth daily as needed.  . gabapentin (NEURONTIN) 300 MG capsule Take 300 mg by mouth 2 (two) times daily.  Marland Kitchen glucose blood (FREESTYLE LITE) test strip 1 each by Other route as needed for other. Use as instructed  . letrozole (FEMARA) 2.5 MG tablet Take 2.5 mg by mouth daily.  Marland Kitchen levothyroxine (SYNTHROID) 75 MCG tablet Take 75 mcg by mouth daily before breakfast.  . metFORMIN (GLUCOPHAGE) 1000 MG tablet Take 1,000 mg by mouth 2 (two) times daily with a meal.  . Multiple Vitamin (MULTIVITAMIN) tablet Take 1 tablet by mouth daily.  . potassium chloride SA (K-DUR)  20 MEQ tablet Take 20 mEq by mouth 2 (two) times daily.  . sacubitril-valsartan (ENTRESTO) 49-51 MG Take 1 tablet by mouth 2 (two) times daily.  Marland Kitchen spironolactone (ALDACTONE) 25 MG tablet Take 25 mg by mouth daily.   No current facility-administered medications on file prior to visit.     Allergies:   Pregabalin and Ramipril   Social History   Tobacco Use  . Smoking status: Never Smoker  . Smokeless tobacco: Never Used  Substance Use Topics  . Alcohol use: Not Currently  . Drug use: Not on file    Family History: family history includes Cancer in her paternal  grandmother; Diabetes in her paternal grandfather; Heart disease in her paternal grandfather.  ROS:   Please see the history of present illness.  Additional pertinent ROS: Constitutional: Negative for chills, fever, night sweats, unintentional weight loss  HENT: Negative for ear pain and hearing loss.   Eyes: Negative for loss of vision and eye pain.  Respiratory: Negative for cough, sputum, wheezing.   Cardiovascular: See HPI. Gastrointestinal: Negative for abdominal pain, melena, and hematochezia.  Genitourinary: Negative for dysuria and hematuria.  Musculoskeletal: Negative for falls and myalgias.  Skin: Negative for itching and rash.  Neurological: Negative for focal weakness, focal sensory changes and loss of consciousness.  Endo/Heme/Allergies: Does not bruise/bleed easily.    EKGs/Labs/Other Studies Reviewed:    The following studies were reviewed today: Echo 07/10/2018 (care everywhere) Study Conclusions  - Left ventricle: The cavity size is normal. Wall thickness is  normal. The estimated ejection fraction was in the range of 40%  to 45%. Doppler parameters are consistent with abnormal left  ventricular relaxation (grade 1 diastolic dysfunction). The  global longitudinal strain was -8.54%. - Right ventricle: Pacer wire noted in right ventricle. - Inferior vena cava: The vessel was normal in size; the  respirophasic diameter changes were in the normal range (</= 50%);  findings are consistent with normal central venous pressure.  ------------------------------------------------------------------- Cardiac Anatomy  Left ventricle: - The cavity size is normal. Wall thickness is normal. The  estimated ejection fraction was in the range of 40% to 45%. The  global longitudinal strain was -8.54%. - Doppler parameters are consistent with abnormal left ventricular  relaxation (grade 1 diastolic dysfunction).  Aortic valve: - The valve was structurally normal. Cusp  separation was normal. Doppler: - Transvalvular velocity is within the normal range. There is no  stenosis. There was no significant regurgitation. - The ratio of LVOT to aortic valve peak velocity is 0.52.  AORTA: Aortic root: The aortic root is not dilated.  Mitral valve: - The valve is structurally normal. Leaflet separation was normal. Doppler: - Transvalvular velocity is within the normal range. There is no  evidence for stenosis. There was trivial regurgitation. - The mean diastolic gradient is 79mm Hg.  Left atrium: - The atrium is normal in size.  Right ventricle: - The cavity size is normal. Wall thickness is normal. Pacer wire  noted in right ventricle. Systolic function is normal.  Pulmonic valve: - The valve was structurally normal. Doppler: - There was trivial regurgitation.  Tricuspid valve: - The valve was structurally normal. Leaflet separation was normal. Doppler: - Transvalvular velocity is within the normal range. There is no  evidence for stenosis. There was trivial regurgitation.  Pulmonary artery: - Not well visualized.  Right atrium: - The atrium is normal in size.  Pericardium: - The pericardium was normal in appearance. There is no pericardial  effusion.  Systemic veins: Inferior vena cava: - The vessel was normal in size; the respirophasic diameter changes  were in the normal range (&gt;= 50%); findings are consistent with  normal central venous pressure. - The internal diameter is 1.2cm. - The proximal internal diameter during inspiration is 0.3cm.  EKG:  EKG is personally reviewed.  The ekg ordered 06/26/19 demonstrates a-sensed, biv-paced rhythm  Recent Labs: 10/12/2019: ALT 25; BUN 18; Creatinine 1.02; Hemoglobin 13.3; Platelets 308; Potassium 4.4; Sodium 141  Recent Lipid Panel No results found for: CHOL, TRIG, HDL, CHOLHDL, VLDL, LDLCALC, LDLDIRECT  Physical Exam:    VS:  BP 96/70   Pulse 74   Temp (!) 97.1 F (36.2 C)    Ht 5\' 5"  (1.651 m)   Wt 243 lb (110.2 kg)   SpO2 95%   BMI 40.44 kg/m     Wt Readings from Last 3 Encounters:  09/15/19 243 lb (110.2 kg)  08/05/19 243 lb (110.2 kg)  07/21/19 243 lb 12.8 oz (110.6 kg)    GEN: Well nourished, well developed in no acute distress HEENT: Normal, moist mucous membranes NECK: No JVD CARDIAC: regular rhythm, normal S1 and S2, no rubs or gallops. No murmur. VASCULAR: Radial and DP pulses 2+ bilaterally. No carotid bruits RESPIRATORY:  Clear to auscultation without rales, wheezing or rhonchi  ABDOMEN: Soft, non-tender, non-distended MUSCULOSKELETAL:  Ambulates independently SKIN: Warm and dry, no edema NEUROLOGIC:  Alert and oriented x 3. No focal neuro deficits noted. PSYCHIATRIC:  Normal affect   ASSESSMENT:    1. Nonischemic cardiomyopathy ([place])   2. Medication management   3. 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 Chronic systolic dysfunction of left ventricle   5. Cardiac risk counseling   6. Counseling on health promotion and disease prevention    PLAN:    Nonischemic cardiomyopathy: likely 2/2 anthracycline treatment for breast cancer -doing well with heart failure recommendations (salt, activity) -s/p CRT, followed by Dr. Rayann Heman -tolerating entresto, though expensive. Continue 49-51 mg dose BID. Blood pressure is too low today to uptitrate, but she denies symptoms -tolerating carvedilol 25 mg BID -tolerating spironolactone 25 mg daily. Given low BP, and that this is nonischemic, if BP remains low would drop this medication first -started jardiance at last visit. Overall tolerating. -lasix as needed only -potassium has been stable at 4.3 most recently, but monitor periodically. If rises, d/c potassium supplements  History of breast cancer, in remission, treated with lumpectomy, sentinel lymph node, adriamycin, cytoxan, taxol, radiation. Treated with tamoxifen x 5years, now on letrozole until 2023. -established with Dr. Jana Hakim  for monitoring  Cardiac risk counseling and prevention recommendations: -recommend heart healthy/Mediterranean diet, with whole grains, fruits, vegetable, fish, lean meats, nuts, and olive oil. Limit salt. -recommend moderate walking, 3-5 times/week for 30-50 minutes each session. Aim for at least 150 minutes.week. Goal should be pace of 3 miles/hours, or walking 1.5 miles in 30 minutes -recommend avoidance of tobacco products. Avoid excess alcohol. -comorbid conditions:  -OSA: on CPAP  -dyslipidemia: lipids 07/28/19 show Tchol 110, HDL 35, LDL 44, TG 158 on atorvastatin 40 mg, continue  -type II diabetes: on jardiance and metformin  -obesity: BMI 40. She is working on weight loss  -hypothyroidism: TSH 1.62 on levothyroxine  -aspirin: on aspirin 81 mg given diabetes  Plan for follow up: 6 mos or sooner PRN  Medication Adjustments/Labs and Tests Ordered: Current medicines are reviewed at length with the patient today.  Concerns regarding medicines are outlined above.  Orders Placed This  Encounter  Procedures  . Basic metabolic panel   Meds ordered this encounter  Medications  . empagliflozin (JARDIANCE) 10 MG TABS tablet    Sig: Take 10 mg by mouth daily before breakfast.    Dispense:  90 tablet    Refill:  3    Patient Instructions  Medication Instructions:  Your Physician recommend you continue on your current medication as directed.     *If you need a refill on your cardiac medications before your next appointment, please call your pharmacy*  Lab Work: Your physician recommends that you return for lab work today ( BMP)  If you have labs (blood work) drawn today and your tests are completely normal, you will receive your results only by: Marland Kitchen MyChart Message (if you have MyChart) OR . A paper copy in the mail If you have any lab test that is abnormal or we need to change your treatment, we will call you to review the results.  Testing/Procedures: None  Follow-Up: At  [facility], you and your health needs are our priority.  As part of our continuing mission to provide you with exceptional heart care, we have created designated Provider Care Teams.  These Care Teams include your primary Cardiologist (physician) and Advanced Practice Providers (APPs -  Physician Assistants and Nurse Practitioners) who all work together to provide you with the care you need, when you need it.  Your next appointment:   6 month(s)  The format for your next appointment:   In Person  Provider:   Buford Dresser, MD     Signed, Buford Dresser, MD PhD 09/15/2019   [place]

## 2019-09-16 LAB — BASIC METABOLIC PANEL
BUN/Creatinine Ratio: 19 (ref 9–23)
BUN: 15 mg/dL (ref 6–24)
CO2: 22 mmol/L (ref 20–29)
Calcium: 10.6 mg/dL — ABNORMAL HIGH (ref 8.7–10.2)
Chloride: 102 mmol/L (ref 96–106)
Creatinine, Ser: 0.81 mg/dL (ref 0.57–1.00)
GFR calc Af Amer: 92 mL/min/{1.73_m2} (ref >59)
GFR calc non Af Amer: 80 mL/min/{1.73_m2} (ref >59)
Glucose: 116 mg/dL — ABNORMAL HIGH (ref 65–99)
Potassium: 4.4 mmol/L (ref 3.5–5.2)
Sodium: 141 mmol/L (ref 134–144)

## 2019-09-25 ENCOUNTER — Ambulatory Visit (INDEPENDENT_AMBULATORY_CARE_PROVIDER_SITE_OTHER): Payer: 59 | Admitting: *Deleted

## 2019-09-25 DIAGNOSIS — I5042 Chronic combined systolic (congestive) and diastolic (congestive) heart failure: Secondary | ICD-10-CM | POA: Diagnosis not present

## 2019-09-28 LAB — CUP PACEART REMOTE DEVICE CHECK
Battery Remaining Longevity: 96 mo
Battery Remaining Percentage: 100 %
Brady Statistic RA Percent Paced: 0 %
Brady Statistic RV Percent Paced: 100 %
Date Time Interrogation Session: 20201218001100
HighPow Impedance: 64 Ohm
Implantable Lead Implant Date: 20110301
Implantable Lead Implant Date: 20110301
Implantable Lead Implant Date: 20110301
Implantable Lead Location: 753858
Implantable Lead Location: 753859
Implantable Lead Location: 753860
Implantable Lead Model: 185
Implantable Lead Model: 350974
Implantable Lead Model: 4592
Implantable Lead Serial Number: 131585
Implantable Lead Serial Number: 28731977
Implantable Lead Serial Number: 327266
Implantable Pulse Generator Implant Date: 20180130
Lead Channel Impedance Value: 537 Ohm
Lead Channel Impedance Value: 579 Ohm
Lead Channel Impedance Value: 616 Ohm
Lead Channel Pacing Threshold Amplitude: 0.5 V
Lead Channel Pacing Threshold Amplitude: 0.6 V
Lead Channel Pacing Threshold Amplitude: 0.9 V
Lead Channel Pacing Threshold Pulse Width: 0.4 ms
Lead Channel Pacing Threshold Pulse Width: 0.4 ms
Lead Channel Pacing Threshold Pulse Width: 0.4 ms
Lead Channel Setting Pacing Amplitude: 1.1 V
Lead Channel Setting Pacing Amplitude: 2 V
Lead Channel Setting Pacing Amplitude: 2 V
Lead Channel Setting Pacing Pulse Width: 0.4 ms
Lead Channel Setting Pacing Pulse Width: 0.4 ms
Lead Channel Setting Sensing Sensitivity: 0.6 mV
Lead Channel Setting Sensing Sensitivity: 1 mV
Pulse Gen Serial Number: 123793

## 2019-10-12 ENCOUNTER — Other Ambulatory Visit: Payer: Self-pay

## 2019-10-12 ENCOUNTER — Inpatient Hospital Stay: Payer: 59 | Attending: Oncology

## 2019-10-12 DIAGNOSIS — Z923 Personal history of irradiation: Secondary | ICD-10-CM | POA: Diagnosis not present

## 2019-10-12 DIAGNOSIS — Z7984 Long term (current) use of oral hypoglycemic drugs: Secondary | ICD-10-CM | POA: Diagnosis not present

## 2019-10-12 DIAGNOSIS — E119 Type 2 diabetes mellitus without complications: Secondary | ICD-10-CM | POA: Insufficient documentation

## 2019-10-12 DIAGNOSIS — Z853 Personal history of malignant neoplasm of breast: Secondary | ICD-10-CM | POA: Insufficient documentation

## 2019-10-12 DIAGNOSIS — C50412 Malignant neoplasm of upper-outer quadrant of left female breast: Secondary | ICD-10-CM

## 2019-10-12 DIAGNOSIS — Z9221 Personal history of antineoplastic chemotherapy: Secondary | ICD-10-CM | POA: Diagnosis not present

## 2019-10-12 DIAGNOSIS — E039 Hypothyroidism, unspecified: Secondary | ICD-10-CM | POA: Diagnosis not present

## 2019-10-12 LAB — CBC WITH DIFFERENTIAL/PLATELET
Abs Immature Granulocytes: 0.03 10*3/uL (ref 0.00–0.07)
Basophils Absolute: 0.1 10*3/uL (ref 0.0–0.1)
Basophils Relative: 1 %
Eosinophils Absolute: 0.1 10*3/uL (ref 0.0–0.5)
Eosinophils Relative: 1 %
HCT: 41.1 % (ref 36.0–46.0)
Hemoglobin: 13.3 g/dL (ref 12.0–15.0)
Immature Granulocytes: 0 %
Lymphocytes Relative: 29 %
Lymphs Abs: 2.8 10*3/uL (ref 0.7–4.0)
MCH: 29.2 pg (ref 26.0–34.0)
MCHC: 32.4 g/dL (ref 30.0–36.0)
MCV: 90.1 fL (ref 80.0–100.0)
Monocytes Absolute: 0.7 10*3/uL (ref 0.1–1.0)
Monocytes Relative: 7 %
Neutro Abs: 6.1 10*3/uL (ref 1.7–7.7)
Neutrophils Relative %: 62 %
Platelets: 308 10*3/uL (ref 150–400)
RBC: 4.56 MIL/uL (ref 3.87–5.11)
RDW: 13.4 % (ref 11.5–15.5)
WBC: 9.7 10*3/uL (ref 4.0–10.5)
nRBC: 0 % (ref 0.0–0.2)

## 2019-10-12 LAB — CMP (CANCER CENTER ONLY)
ALT: 25 U/L (ref 0–44)
AST: 21 U/L (ref 15–41)
Albumin: 4.2 g/dL (ref 3.5–5.0)
Alkaline Phosphatase: 78 U/L (ref 38–126)
Anion gap: 10 (ref 5–15)
BUN: 18 mg/dL (ref 6–20)
CO2: 28 mmol/L (ref 22–32)
Calcium: 9.9 mg/dL (ref 8.9–10.3)
Chloride: 103 mmol/L (ref 98–111)
Creatinine: 1.02 mg/dL — ABNORMAL HIGH (ref 0.44–1.00)
GFR, Est AFR Am: >60 mL/min (ref >60)
GFR, Estimated: >60 mL/min (ref >60)
Glucose, Bld: 128 mg/dL — ABNORMAL HIGH (ref 70–99)
Potassium: 4.4 mmol/L (ref 3.5–5.1)
Sodium: 141 mmol/L (ref 135–145)
Total Bilirubin: 1 mg/dL (ref 0.3–1.2)
Total Protein: 7.9 g/dL (ref 6.5–8.1)

## 2019-10-13 ENCOUNTER — Encounter: Payer: Self-pay | Admitting: Cardiology

## 2019-10-25 NOTE — Progress Notes (Signed)
[place]  Telephone:([phone]) [phone] Fax:(336) [phone]    ID: Jordan Daugherty DOB: 1961-06-01  MR#: [id number]  JAS#:[id number]  Patient Care Team: Wenda Low, MD as PCP - General (Internal Medicine) Buford Dresser, MD as PCP - Cardiology (Cardiology) Rockwell Germany, RN as Oncology Nurse Navigator Mauro Kaufmann, RN as Oncology Nurse Navigator Magrinat, Virgie Dad, MD as Consulting Physician (Medical Oncology) Thompson Grayer, MD as Consulting Physician (Cardiology) Buford Dresser, MD as Consulting Physician (Cardiology) Christophe Louis, MD as Consulting Physician (Obstetrics and Gynecology) OTHER MD:   CHIEF COMPLAINT: Estrogen receptor positive breast cancer  CURRENT TREATMENT: To discontinue letrozole   INTERVAL HISTORY: Jordan Daugherty returns today for follow up of her history of estrogen receptor positive breast cancer.  She was supposed to have undergone labs and genetic testing here and mammography at [facility], but none of that happened due to issues with her insurance.  Basically if she had had those done last year they would have been in last years deductible whereas she wanted it all to be in this years.  REVIEW OF SYSTEMS: Jordan Daugherty tells me she is doing fine.  She has hooked up with cardiology and electrophysiology and is doing well with her pacemaker.  She is working with Dr. Deforest Hoyles regarding her sugar and she thinks she is making progress.  She is walks about 45 minutes most days.  A detailed review of systems today was otherwise noncontributory  HISTORY OF CURRENT ILLNESS: From the original intake note:  Jordan Daugherty was referred here by Dr. Wenda Low at [facility] for her history of left breast cancer. She was followed by Dr. Luciano Cutter Ward at [facility].  Unfortunately we have not been able to access the records directly, but from the patient's story, I gather that at age 89 she had a T1-2 N1, anatomic stage IIB invasive breast  cancer, most likely ductal; status post left lumpectomy and axillary lymph node dissection August 2007. She recalls having 2 out of 7 left axillary lymph nodes positive.  The tumor was estrogen and progesterone receptor positive, HER-2 nonamplified.  She was treated with adjuvant chemotherapy which from her details appears to have been cyclophosphamide and doxorubicin in dose dense fashion x4 followed by paclitaxel ("and another drug") again in dose dense fashion x4.  She then received adjuvant radiation for 32 treatments and tamoxifen for 7 years, and letrozole since 2015.  She has tolerated letrozole well except for some hot flashes.   Jalayia's last bone density screening on 09/05/2017, showed a T-score of 2.2, which is considered normal.    Her subsequent history is as detailed below   PAST MEDICAL HISTORY: Past Medical History:  Diagnosis Date  . Breast cancer ([place])    in remission  . Chronic systolic dysfunction of left ventricle   . Dyslipidemia   . Hypothyroidism   . Morbid obesity with BMI of 40.0-44.9, adult ([place])   . Nonischemic cardiomyopathy ([place])   . OSA on CPAP   . Type II diabetes mellitus (H[facility])   Diabetes mellitus, hypothyroidism, hypercholesterolemia   PAST SURGICAL HISTORY: Past Surgical History:  Procedure Laterality Date  . ICD IMPLANT  11/06/2016   [employer] Dynagen BiV ICD implanted for primary prevention  Status post left lumpectomy and axillary exploration, status post right oophorectomy   FAMILY HISTORY: Family History  Problem Relation Age of Onset  . Cancer Paternal Grandmother   . Diabetes Paternal Grandfather   . Heart disease Paternal Grandfather  Jordan Daugherty's father is 50 years old as of October 2020. Patients' mother is 47 years old as of October 2020. The patient has 4 brothers and no sisters. Patient denies anyone in her family having breast, ovarian, prostate, or pancreatic cancer.  A paternal grandmother had uterine cancer at age  68   GYNECOLOGIC HISTORY:  No LMP recorded. Menarche: 59 years old GX P 0.  The patient had 13 pregnancies, all stillborn, usually ending in the fourth or fifth month. 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  Her husband Jordan Daugherty works in Engineer, technical sales.  At home is just the 2 of them, with no pets.  The patient attends a nondenominatinal church [employer] stage [employer])   ADVANCED DIRECTIVES: In the absence of any documents to the contrary the patient's husband is her healthcare power of attorney   HEALTH MAINTENANCE: Social History   Tobacco Use  . Smoking status: Never Smoker  . Smokeless tobacco: Never Used  Substance Use Topics  . Alcohol use: Not Currently  . Drug use: Not on file    Colonoscopy: 08/07/2012  PAP: 118  Bone density: 09/05/2017; 2.2 normal Mammography: Due  Allergies  Allergen Reactions  . Pregabalin     MAKES HER MEAN  . Ramipril Other (See Comments)    cough    Current Outpatient Medications  Medication Sig Dispense Refill  . acetaminophen (TYLENOL) 500 MG tablet Take 500 mg by mouth every 6 (six) hours as needed.    Marland Kitchen aspirin EC 81 MG tablet Take 81 mg by mouth daily.    Marland Kitchen atorvastatin (LIPITOR) 40 MG tablet Take 40 mg by mouth daily.    . Calcium Acetate, Phos Binder, (CALCIUM ACETATE PO) Take 400 mg by mouth daily.    . carvedilol (COREG) 25 MG tablet Take 25 mg by mouth 2 (two) times daily with a meal.    . cetirizine (ZYRTEC) 10 MG chewable tablet Chew 10 mg by mouth daily.    . empagliflozin (JARDIANCE) 10 MG TABS tablet Take 10 mg by mouth daily before breakfast. 90 tablet 3  . fluticasone (VERAMYST) 27.5 MCG/SPRAY nasal spray Place 1 spray into the nose daily.    . furosemide (LASIX) 40 MG tablet Take 40 mg by mouth daily as needed.    . gabapentin  (NEURONTIN) 300 MG capsule Take 300 mg by mouth 2 (two) times daily.    Marland Kitchen glucose blood (FREESTYLE LITE) test strip 1 each by Other route as needed for other. Use as instructed    . letrozole (FEMARA) 2.5 MG tablet Take 2.5 mg by mouth daily.    Marland Kitchen levothyroxine (SYNTHROID) 75 MCG tablet Take 75 mcg by mouth daily before breakfast.    . metFORMIN (GLUCOPHAGE) 1000 MG tablet Take 1,000 mg by mouth 2 (two) times daily with a meal.    . Multiple Vitamin (MULTIVITAMIN) tablet Take 1 tablet by mouth daily.    . potassium chloride SA (K-DUR) 20 MEQ tablet Take 20 mEq by mouth 2 (two) times daily.    . sacubitril-valsartan (ENTRESTO) 49-51 MG Take 1 tablet by mouth 2 (two) times daily.    Marland Kitchen spironolactone (ALDACTONE) 25 MG tablet Take 25 mg by mouth daily.     No current facility-administered medications for this visit.    OBJECTIVE: Morbidly obese white  woman who appears stated age  59:   10/26/19 1519  BP: (!) 95/54  Pulse: 84  Resp: 18  Temp: 98 F (36.7 C)  SpO2: 97%   Wt Readings from Last 3 Encounters:  10/26/19 243 lb 1.6 oz (110.3 kg)  09/15/19 243 lb (110.2 kg)  08/05/19 243 lb (110.2 kg)   Body mass index is 40.45 kg/m.    ECOG FS:1 - Symptomatic but completely ambulatory  Sclerae unicteric, EOMs intact Wearing a mask No cervical or supraclavicular adenopathy Lungs no rales or rhonchi Heart regular rate and rhythm Abd soft, nontender, positive bowel sounds MSK no focal spinal tenderness, no upper extremity lymphedema Neuro: nonfocal, well oriented, appropriate affect Breasts: The right breast is benign.  The left breast has undergone lumpectomy and radiation.  There is no evidence of local recurrence.  Both axillae are benign.  LAB RESULTS:  CMP     Component Value Date/Time   NA 141 10/12/2019 1415   NA 141 09/15/2019 1526   K 4.4 10/12/2019 1415   CL 103 10/12/2019 1415   CO2 28 10/12/2019 1415   GLUCOSE 128 (H) 10/12/2019 1415   BUN 18 10/12/2019 1415    BUN 15 09/15/2019 1526   CREATININE 1.02 (H) 10/12/2019 1415   CALCIUM 9.9 10/12/2019 1415   PROT 7.9 10/12/2019 1415   ALBUMIN 4.2 10/12/2019 1415   AST 21 10/12/2019 1415   ALT 25 10/12/2019 1415   ALKPHOS 78 10/12/2019 1415   BILITOT 1.0 10/12/2019 1415   GFRNONAA >60 10/12/2019 1415   GFRAA >60 10/12/2019 1415    No results found for: TOTALPROTELP, ALBUMINELP, A1GS, A2GS, BETS, BETA2SER, GAMS, MSPIKE, SPEI  No results found for: KPAFRELGTCHN, LAMBDASER, KAPLAMBRATIO  Lab Results  Component Value Date   WBC 9.7 10/12/2019   NEUTROABS 6.1 10/12/2019   HGB 13.3 10/12/2019   HCT 41.1 10/12/2019   MCV 90.1 10/12/2019   PLT 308 10/12/2019    No results found for: LABCA2  No components found for: [id number]  No results for input(s): INR in the last 168 hours.  No results found for: LABCA2  No results found for: YQM578  No results found for: ION629  No results found for: BMW413  No results found for: CA2729  No components found for: HGQUANT  No results found for: CEA1 / No results found for: CEA1   No results found for: AFPTUMOR  No results found for: CHROMOGRNA  No results found for: HGBA, HGBA2QUANT, HGBFQUANT, HGBSQUAN (Hemoglobinopathy evaluation)   No results found for: LDH  No results found for: IRON, TIBC, IRONPCTSAT (Iron and TIBC)  No results found for: FERRITIN  Urinalysis No results found for: COLORURINE, APPEARANCEUR, LABSPEC, PHURINE, GLUCOSEU, HGBUR, BILIRUBINUR, KETONESUR, PROTEINUR, UROBILINOGEN, NITRITE, LEUKOCYTESUR   STUDIES:  No results found.   ELIGIBLE FOR AVAILABLE RESEARCH PROTOCOL: no   ASSESSMENT: 59 y.o. [place], [place] woman status post left lumpectomy and axillary lymph node dissection August 2008 for a T1, N1 invasive breast cancer, estrogen and progesterone receptor positive, HER-2 not amplified  (a) 2 out of 7 lymph nodes were involved  (1) status post adjuvant chemotherapy with cyclophosphamide/doxorubicin in  dose dense fashion x4 followed by paclitaxel ("and another drug") in dose dense fashion x4  (2) adjuvant radiation given for total of 32 treatments after chemotherapy  (3) antiestrogens included tamoxifen for 7-10 years, then letrozole starting 2015, stopping January 2021  (4) genetics testing pending  (5) history of multiple late miscarriages, possible antiphospholipid antibody syndrome: Labs pending  PLAN: I have recommended that Talyia stop the letrozole last visit but she somehow did not hear that so clearly or was not convinced.  In any case we again discussed that today there is no data that continuing letrozole at this point would be helpful.  I do not have data that it would be harmful so if she really wants to continue letrozole that is fine I will be glad to provide it for her.  At this point she tells me she is willing to go off letrozole and the question is that she is going to feel any better in about 3 months.  I have asked her to give [place] a call in May to let us know how she is doing with regards to that.  I would be very comfortable with her discontinuing it permanently.  I had set her up for mammography, genetics testing and a lupus anticoagulant test but she did not follow-up on those for reasons discussed above.  I have reentered those orders for her.  She will be seeing Dr. Deforest Hoyles every 6 months and she will be seeing her cardiologist and electrophysiologist in the latter part of the year.  Accordingly I will see her on a yearly basis beginning May 2022.  I do not think I need to see her again in May of this year unless she has any specific questions.  Total encounter time 25 minutes.  Redell Nazir, Virgie Dad, MD  10/26/19 3:57 PM Medical Oncology and Hematology [facility] [place], [place] [address] Tel. [phone]    Fax. [phone]    I, Wilburn Mylar, am acting as scribe for Dr. Virgie Dad. Tyheim Vanalstyne.  I, Lurline Del MD, have  reviewed the above documentation for accuracy and completeness, and I agree with the above.    *Total Encounter Time as defined by the Centers for Medicare and Medicaid Services includes, in addition to the face-to-face time of a patient visit (documented in the note above) non-face-to-face time: obtaining and reviewing outside history, ordering and reviewing medications, tests or procedures, care coordination (communications with other health care professionals or caregivers) and documentation in the medical record.

## 2019-10-26 ENCOUNTER — Other Ambulatory Visit: Payer: Self-pay

## 2019-10-26 ENCOUNTER — Inpatient Hospital Stay: Payer: 59 | Admitting: Oncology

## 2019-10-26 VITALS — BP 95/54 | HR 84 | Temp 98.0°F | Resp 18 | Ht 65.0 in | Wt 243.1 lb

## 2019-10-26 DIAGNOSIS — C50412 Malignant neoplasm of upper-outer quadrant of left female breast: Secondary | ICD-10-CM

## 2019-10-26 DIAGNOSIS — D6861 Antiphospholipid syndrome: Secondary | ICD-10-CM | POA: Diagnosis not present

## 2019-10-26 DIAGNOSIS — Z6841 Body Mass Index (BMI) 40.0 and over, adult: Secondary | ICD-10-CM

## 2019-10-26 DIAGNOSIS — Z170 Estrogen receptor positive status [ER+]: Secondary | ICD-10-CM

## 2019-10-26 DIAGNOSIS — Z853 Personal history of malignant neoplasm of breast: Secondary | ICD-10-CM | POA: Diagnosis not present

## 2019-10-27 ENCOUNTER — Telehealth: Payer: Self-pay | Admitting: Oncology

## 2019-10-27 NOTE — Telephone Encounter (Signed)
I left a message regarding schedule. I could not add lab for 02/07/2021

## 2019-11-06 ENCOUNTER — Telehealth: Payer: Self-pay | Admitting: Genetic Counselor

## 2019-11-06 NOTE — Telephone Encounter (Signed)
I LEFT a message regarding video visit 2/1

## 2019-11-09 ENCOUNTER — Other Ambulatory Visit: Payer: Self-pay | Admitting: Genetic Counselor

## 2019-11-09 ENCOUNTER — Other Ambulatory Visit: Payer: 59

## 2019-11-09 ENCOUNTER — Inpatient Hospital Stay: Payer: 59 | Attending: Oncology | Admitting: Genetic Counselor

## 2019-11-09 DIAGNOSIS — Z853 Personal history of malignant neoplasm of breast: Secondary | ICD-10-CM | POA: Insufficient documentation

## 2019-11-09 DIAGNOSIS — Z8049 Family history of malignant neoplasm of other genital organs: Secondary | ICD-10-CM | POA: Diagnosis not present

## 2019-11-09 DIAGNOSIS — Z8042 Family history of malignant neoplasm of prostate: Secondary | ICD-10-CM

## 2019-11-09 DIAGNOSIS — C50412 Malignant neoplasm of upper-outer quadrant of left female breast: Secondary | ICD-10-CM | POA: Diagnosis not present

## 2019-11-09 DIAGNOSIS — Z170 Estrogen receptor positive status [ER+]: Secondary | ICD-10-CM

## 2019-11-09 DIAGNOSIS — Z8041 Family history of malignant neoplasm of ovary: Secondary | ICD-10-CM

## 2019-11-09 DIAGNOSIS — Z800 Family history of malignant neoplasm of digestive organs: Secondary | ICD-10-CM

## 2019-11-10 ENCOUNTER — Inpatient Hospital Stay: Payer: 59

## 2019-11-10 ENCOUNTER — Other Ambulatory Visit: Payer: Self-pay

## 2019-11-10 ENCOUNTER — Encounter: Payer: Self-pay | Admitting: Genetic Counselor

## 2019-11-10 DIAGNOSIS — D6861 Antiphospholipid syndrome: Secondary | ICD-10-CM

## 2019-11-10 DIAGNOSIS — Z8041 Family history of malignant neoplasm of ovary: Secondary | ICD-10-CM | POA: Insufficient documentation

## 2019-11-10 DIAGNOSIS — Z800 Family history of malignant neoplasm of digestive organs: Secondary | ICD-10-CM | POA: Insufficient documentation

## 2019-11-10 DIAGNOSIS — Z8049 Family history of malignant neoplasm of other genital organs: Secondary | ICD-10-CM | POA: Insufficient documentation

## 2019-11-10 DIAGNOSIS — Z170 Estrogen receptor positive status [ER+]: Secondary | ICD-10-CM

## 2019-11-10 DIAGNOSIS — Z6841 Body Mass Index (BMI) 40.0 and over, adult: Secondary | ICD-10-CM

## 2019-11-10 DIAGNOSIS — C50412 Malignant neoplasm of upper-outer quadrant of left female breast: Secondary | ICD-10-CM

## 2019-11-10 DIAGNOSIS — Z8042 Family history of malignant neoplasm of prostate: Secondary | ICD-10-CM | POA: Insufficient documentation

## 2019-11-10 DIAGNOSIS — Z853 Personal history of malignant neoplasm of breast: Secondary | ICD-10-CM | POA: Diagnosis present

## 2019-11-10 LAB — CBC WITH DIFFERENTIAL/PLATELET
Abs Immature Granulocytes: 0.02 10*3/uL (ref 0.00–0.07)
Basophils Absolute: 0 10*3/uL (ref 0.0–0.1)
Basophils Relative: 0 %
Eosinophils Absolute: 0.1 10*3/uL (ref 0.0–0.5)
Eosinophils Relative: 1 %
HCT: 42.4 % (ref 36.0–46.0)
Hemoglobin: 13.9 g/dL (ref 12.0–15.0)
Immature Granulocytes: 0 %
Lymphocytes Relative: 27 %
Lymphs Abs: 2.4 10*3/uL (ref 0.7–4.0)
MCH: 29.6 pg (ref 26.0–34.0)
MCHC: 32.8 g/dL (ref 30.0–36.0)
MCV: 90.4 fL (ref 80.0–100.0)
Monocytes Absolute: 0.7 10*3/uL (ref 0.1–1.0)
Monocytes Relative: 8 %
Neutro Abs: 5.7 10*3/uL (ref 1.7–7.7)
Neutrophils Relative %: 64 %
Platelets: 340 10*3/uL (ref 150–400)
RBC: 4.69 MIL/uL (ref 3.87–5.11)
RDW: 13.4 % (ref 11.5–15.5)
WBC: 9 10*3/uL (ref 4.0–10.5)
nRBC: 0 % (ref 0.0–0.2)

## 2019-11-10 LAB — COMPREHENSIVE METABOLIC PANEL
ALT: 23 U/L (ref 0–44)
AST: 16 U/L (ref 15–41)
Albumin: 4.2 g/dL (ref 3.5–5.0)
Alkaline Phosphatase: 103 U/L (ref 38–126)
Anion gap: 11 (ref 5–15)
BUN: 16 mg/dL (ref 6–20)
CO2: 24 mmol/L (ref 22–32)
Calcium: 9.6 mg/dL (ref 8.9–10.3)
Chloride: 105 mmol/L (ref 98–111)
Creatinine, Ser: 0.93 mg/dL (ref 0.44–1.00)
GFR calc Af Amer: >60 mL/min (ref >60)
GFR calc non Af Amer: >60 mL/min (ref >60)
Glucose, Bld: 179 mg/dL — ABNORMAL HIGH (ref 70–99)
Potassium: 3.9 mmol/L (ref 3.5–5.1)
Sodium: 140 mmol/L (ref 135–145)
Total Bilirubin: 1 mg/dL (ref 0.3–1.2)
Total Protein: 8.1 g/dL (ref 6.5–8.1)

## 2019-11-10 NOTE — Progress Notes (Signed)
REFERRING PROVIDER: Chauncey Cruel, MD [address] [place],  [place] [address]  PRIMARY PROVIDER:  Wenda Low, MD  PRIMARY REASON FOR VISIT:  1. Malignant neoplasm of upper-outer quadrant of left breast in female, estrogen receptor positive ([place])   2. Family history of rectal cancer   3. Family history of uterine cancer   4. Family history of ovarian cancer   5. Family history of prostate cancer      HISTORY OF PRESENT ILLNESS:  I connected with  Jordan Daugherty on 11/10/2019 at 2 PM EDT by MyChart video conference and verified that I am speaking with the correct person using two identifiers.   Patient location: Home Provider location: [facility]  Jordan Daugherty, a 59 y.o. female, was seen for a [place] cancer genetics consultation at the request of Dr. Jana Hakim due to a personal and family history of cancer.  Jordan Daugherty presents to clinic today to discuss the possibility of a hereditary predisposition to cancer, genetic testing, and to further clarify her future cancer risks, as well as potential cancer risks for family members.   In 2006, at the age of 72, Jordan Daugherty was diagnosed with invasive ductal carcinoma of the left breast. The treatment plan lumpectomy, chemotherapy and radiation.   CANCER HISTORY:  Oncology History  Malignant neoplasm of upper-outer quadrant of left breast in female, estrogen receptor positive ([place])  07/21/2019 Initial Diagnosis   Malignant neoplasm of upper-outer quadrant of left breast in female, estrogen receptor positive ([place])   07/21/2019 Cancer Staging   Staging form: Breast, AJCC 8th Edition - Clinical: cT2, cN1, cM0(i+), GX, ER+, PR+, HER2- - Signed by Chauncey Cruel, MD on 07/21/2019      RISK FACTORS:  Menarche was at age 61-13.  First live birth at age 70 - 83 miscarriages.  Ovaries intact: yes.  Hysterectomy: no.  Menopausal status: postmenopausal.  HRT use: 0 years.  Past Medical History:  Diagnosis Date  .  Breast cancer ([place])    in remission  . Chronic systolic dysfunction of left ventricle   . Dyslipidemia   . Family history of ovarian cancer   . Family history of prostate cancer   . Family history of rectal cancer   . Family history of uterine cancer   . Hypothyroidism   . Morbid obesity with BMI of 40.0-44.9, adult ([place])   . Nonischemic cardiomyopathy ([place])   . OSA on CPAP   . Type II diabetes mellitus ([place])     Past Surgical History:  Procedure Laterality Date  . ICD IMPLANT  11/06/2016   [employer] Dynagen BiV ICD implanted for primary prevention    Social History   Socioeconomic History  . Marital status: Married    Spouse name: Not on file  . Number of children: Not on file  . Years of education: Not on file  . Highest education level: Not on file  Occupational History  . Not on file  Tobacco Use  . Smoking status: Never Smoker  . Smokeless tobacco: Never Used  Substance and Sexual Activity  . Alcohol use: Not Currently  . Drug use: Not on file  . Sexual activity: Not on file  Other Topics Concern  . Not on file  Social History Narrative   Lives in [place] from [place] Strain:   . Difficulty of Paying Living Expenses: Not on file  Food Insecurity:   . Worried  About Running Out of Food in the Last Year: Not on file  . Ran Out of Food in the Last Year: Not on file  Transportation Needs:   . Lack of Transportation (Medical): Not on file  . Lack of Transportation (Non-Medical): Not on file  Physical Activity:   . Days of Exercise per Week: Not on file  . Minutes of Exercise per Session: Not on file  Stress:   . Feeling of Stress : Not on file  Social Connections:   . Frequency of Communication with Friends and Family: Not on file  . Frequency of Social Gatherings with Friends and Family: Not on file  . Attends Religious Services: Not on file  . Active Member of Clubs or  Organizations: Not on file  . Attends Archivist Meetings: Not on file  . Marital Status: Not on file     FAMILY HISTORY:  We obtained a detailed, 4-generation family history.  Significant diagnoses are listed below: Family History  Problem Relation Age of Onset  . Prostate cancer Father 71  . Dementia Father   . Uterine cancer Paternal Grandmother 84  . Diabetes Paternal Grandfather   . Heart disease Paternal Grandfather   . Skin cancer Brother 88  . Ovarian cancer Other 25       MGMs sister  . Rectal cancer Other        MGMs mother    The patient does not have children, but reports having 13 miscarriages.  She has four brothers, one who had skin cancer around age 68.  Her parents are currently living.  The patients mother is alive and well.  She has a brother and sister who died as infants and another sister who lived to adulthood and died of non cancer related causes.  There is no reported cancer history on the maternal side of the family.  The patient's father is 63 and has dementia.  He had an enlarged prostate and the family was told that he had early stages of prostate cancer.  He has two brothers and a sister who are reportedly cancer free. His parents are deceased, his mother had uterine cancer at 48.  His mother had one sister who had ovarian cancer, and her mother (the patient's father's maternal grandmother) had rectal cancer in her 57's.  Jordan Daugherty is unaware of previous family history of genetic testing for hereditary cancer risks. Patient's maternal ancestors are of Caucasian descent, and paternal ancestors are of Caucasian descent. There is no reported Ashkenazi Jewish ancestry. There is no known consanguinity.  GENETIC COUNSELING ASSESSMENT: Jordan Daugherty is a 59 y.o. female with a personal and family history of cancer which is somewhat suggestive of a hereditary cancer syndrome and predisposition to cancer given the patient's young age of onset and her family  history of cancer. We, therefore, discussed and recommended the following at today's visit.   DISCUSSION: We discussed that 5 - 10% of breast cancer is hereditary, with most cases associated with BRCA mutations.  There are other genes that can be associated with hereditary breast cancer syndromes.  Based on her family history I am most concerned about the lower penetrance Lynch syndrome genes.  We discussed that testing is beneficial for several reasons including knowing how to follow individuals after completing their treatment, identifying whether potential treatment options such as PARP inhibitors would be beneficial, and understand if other family members could be at risk for cancer and allow them to undergo genetic testing.  We reviewed the characteristics, features and inheritance patterns of hereditary cancer syndromes. We also discussed genetic testing, including the appropriate family members to test, the process of testing, insurance coverage and turn-around-time for results. We discussed the implications of a negative, positive, carrier and/or variant of uncertain significant result. We recommended Jordan Daugherty pursue genetic testing for the common hereditary gene panel. The Common Hereditary Gene Panel offered by [facility] includes sequencing and/or deletion duplication testing of the following 48 genes: APC, ATM, AXIN2, BARD1, BMPR1A, BRCA1, BRCA2, BRIP1, CDH1, CDK4, CDKN2A (p14ARF), CDKN2A (p16INK4a), CHEK2, CTNNA1, DICER1, EPCAM (Deletion/duplication testing only), GREM1 (promoter region deletion/duplication testing only), KIT, MEN1, MLH1, MSH2, MSH3, MSH6, MUTYH, NBN, NF1, NHTL1, PALB2, PDGFRA, PMS2, POLD1, POLE, PTEN, RAD50, RAD51C, RAD51D, RNF43, SDHB, SDHC, SDHD, SMAD4, SMARCA4. STK11, TP53, TSC1, TSC2, and VHL.  The following genes were evaluated for sequence changes only: SDHA and HOXB13 c.251G>A variant only.  Based on Jordan Daugherty's personal and family history of cancer, she meets medical  criteria for genetic testing. Despite that she  meets criteria, she  may still have an out of pocket cost. We discussed that if her out of pocket cost for testing is over $100, the laboratory will call and confirm whether she wants to proceed with testing.  If the out of pocket cost of testing is less than $100 she  will be billed by the genetic testing laboratory.   PLAN: After considering the risks, benefits, and limitations, Jordan Daugherty provided informed consent to pursue genetic testing.  She will come in for a blood draw on November 10, 2019 and the blood sample will be sent to [facility] for analysis of the common hereditary cancer panel. Results should be available within approximately 2-3 weeks' time, at which point they will be disclosed by telephone to Jordan Daugherty, as will any additional recommendations warranted by these results. Jordan Daugherty will receive a summary of her genetic counseling visit and a copy of her results once available. This information will also be available in Epic.   Lastly, we encouraged Jordan Daugherty to remain in contact with cancer genetics annually so that we can continuously update the family history and inform her of any changes in cancer genetics and testing that may be of benefit for this family.   Jordan Daugherty questions were answered to her satisfaction today. Our contact information was provided should additional questions or concerns arise. Thank you for the referral and allowing [place] to share in the care of your patient.   Lille Karim P. [place], [place], [facility] Licensed, Insurance risk surveyor [email or web address] phone: [phone]  The patient was seen for a total of 55 minutes in face-to-face genetic counseling.  This patient was discussed with Drs. Magrinat, Lindi Adie and/or Burr Medico who agrees with the above.    _______________________________________________________________________ For Office Staff:  Number of people involved in session: 1 Was an Intern/  student involved with case: no

## 2019-11-11 LAB — LUPUS ANTICOAGULANT PANEL
DRVVT: 39.9 s (ref 0.0–47.0)
PTT Lupus Anticoagulant: 27.9 s (ref 0.0–51.9)

## 2019-11-11 LAB — GENETIC SCREENING ORDER

## 2019-11-20 ENCOUNTER — Encounter: Payer: Self-pay | Admitting: Genetic Counselor

## 2019-11-20 DIAGNOSIS — Z1379 Encounter for other screening for genetic and chromosomal anomalies: Secondary | ICD-10-CM | POA: Insufficient documentation

## 2019-11-23 ENCOUNTER — Telehealth: Payer: Self-pay | Admitting: Genetic Counselor

## 2019-11-23 ENCOUNTER — Ambulatory Visit: Payer: Self-pay | Admitting: Genetic Counselor

## 2019-11-23 DIAGNOSIS — C50412 Malignant neoplasm of upper-outer quadrant of left female breast: Secondary | ICD-10-CM

## 2019-11-23 DIAGNOSIS — Z1379 Encounter for other screening for genetic and chromosomal anomalies: Secondary | ICD-10-CM

## 2019-11-23 NOTE — Telephone Encounter (Signed)
Revealed negative genetic testing.  Discussed that we do not know why she has breast cancer or why there is cancer in the family. It could be due to a different gene that we are not testing, or maybe our current technology may not be able to pick something up.  It will be important for her to keep in contact with genetics to keep up with whether additional testing may be needed. 

## 2019-11-23 NOTE — Progress Notes (Signed)
HPI:  Ms. Grenz was previously seen in the [place] clinic due to a personal and family history of cancer and concerns regarding a hereditary predisposition to cancer. Please refer to our prior cancer genetics clinic note for more information regarding our discussion, assessment and recommendations, at the time. Ms. Pendergraft recent genetic test results were disclosed to her, as were recommendations warranted by these results. These results and recommendations are discussed in more detail below.  CANCER HISTORY:  Oncology History  Malignant neoplasm of upper-outer quadrant of left breast in female, estrogen receptor positive ([place])  07/21/2019 Initial Diagnosis   Malignant neoplasm of upper-outer quadrant of left breast in female, estrogen receptor positive ([place])   07/21/2019 Cancer Staging   Staging form: Breast, AJCC 8th Edition - Clinical: cT2, cN1, cM0(i+), GX, ER+, PR+, HER2- - Signed by Chauncey Cruel, MD on 07/21/2019   11/19/2019 Genetic Testing   Negative genetic testing on the common hereditary cancer panel.  The Common Hereditary Gene Panel offered by [facility] includes sequencing and/or deletion duplication testing of the following 48 genes: APC, ATM, AXIN2, BARD1, BMPR1A, BRCA1, BRCA2, BRIP1, CDH1, CDK4, CDKN2A (p14ARF), CDKN2A (p16INK4a), CHEK2, CTNNA1, DICER1, EPCAM (Deletion/duplication testing only), GREM1 (promoter region deletion/duplication testing only), KIT, MEN1, MLH1, MSH2, MSH3, MSH6, MUTYH, NBN, NF1, NHTL1, PALB2, PDGFRA, PMS2, POLD1, POLE, PTEN, RAD50, RAD51C, RAD51D, RNF43, SDHB, SDHC, SDHD, SMAD4, SMARCA4. STK11, TP53, TSC1, TSC2, and VHL.  The following genes were evaluated for sequence changes only: SDHA and HOXB13 c.251G>A variant only. The report date is November 19, 2019.     FAMILY HISTORY:  We obtained a detailed, 4-generation family history.  Significant diagnoses are listed below: Family History  Problem Relation Age of Onset  . Prostate  cancer Father 58  . Dementia Father   . Uterine cancer Paternal Grandmother 71  . Diabetes Paternal Grandfather   . Heart disease Paternal Grandfather   . Skin cancer Brother 4  . Ovarian cancer Other 77       MGMs sister  . Rectal cancer Other        MGMs mother      GENETIC TEST RESULTS: Genetic testing reported out on November 19, 2019 through the common hereditary cancer panel found no pathogenic mutations. The Common Hereditary Gene Panel offered by [facility] includes sequencing and/or deletion duplication testing of the following 48 genes: APC, ATM, AXIN2, BARD1, BMPR1A, BRCA1, BRCA2, BRIP1, CDH1, CDK4, CDKN2A (p14ARF), CDKN2A (p16INK4a), CHEK2, CTNNA1, DICER1, EPCAM (Deletion/duplication testing only), GREM1 (promoter region deletion/duplication testing only), KIT, MEN1, MLH1, MSH2, MSH3, MSH6, MUTYH, NBN, NF1, NHTL1, PALB2, PDGFRA, PMS2, POLD1, POLE, PTEN, RAD50, RAD51C, RAD51D, RNF43, SDHB, SDHC, SDHD, SMAD4, SMARCA4. STK11, TP53, TSC1, TSC2, and VHL.  The following genes were evaluated for sequence changes only: SDHA and HOXB13 c.251G>A variant only. The test report has been scanned into EPIC and is located under the Molecular Pathology section of the Results Review tab.  A portion of the result report is included below for reference.     We discussed with Ms. Casselman that because current genetic testing is not perfect, it is possible there may be a gene mutation in one of these genes that current testing cannot detect, but that chance is small.  We also discussed, that there could be another gene that has not yet been discovered, or that we have not yet tested, that is responsible for the cancer diagnoses in the family. It is also possible there is a hereditary cause for the cancer  in the family that Ms. Cassada did not inherit and therefore was not identified in her testing.  Therefore, it is important to remain in touch with cancer genetics in the future so that we can continue to offer  Ms. Lopata the most up to date genetic testing.   ADDITIONAL GENETIC TESTING: We discussed with Ms. Belcourt that there are other genes that are associated with increased cancer risk that can be analyzed. Should Ms. Sickles wish to pursue additional genetic testing, we are happy to discuss and coordinate this testing, at any time.    CANCER SCREENING RECOMMENDATIONS: Ms. Treaster test result is considered negative (normal).  This means that we have not identified a hereditary cause for her personal and family history of cancer at this time. Most cancers happen by chance and this negative test suggests that her cancer may fall into this category.    While reassuring, this does not definitively rule out a hereditary predisposition to cancer. It is still possible that there could be genetic mutations that are undetectable by current technology. There could be genetic mutations in genes that have not been tested or identified to increase cancer risk.  Therefore, it is recommended she continue to follow the cancer management and screening guidelines provided by her oncology and primary healthcare provider.   An individual's cancer risk and medical management are not determined by genetic test results alone. Overall cancer risk assessment incorporates additional factors, including personal medical history, family history, and any available genetic information that may result in a personalized plan for cancer prevention and surveillance  RECOMMENDATIONS FOR FAMILY MEMBERS:  Individuals in this family might be at some increased risk of developing cancer, over the general population risk, simply due to the family history of cancer.  We recommended women in this family have a yearly mammogram beginning at age 47, or 49 years younger than the earliest onset of cancer, an annual clinical breast exam, and perform monthly breast self-exams. Women in this family should also have a gynecological exam as recommended by their  primary provider. All family members should have a colonoscopy by age 70.  FOLLOW-UP: Lastly, we discussed with Ms. Mifflin that cancer genetics is a rapidly advancing field and it is possible that new genetic tests will be appropriate for her and/or her family members in the future. We encouraged her to remain in contact with cancer genetics on an annual basis so we can update her personal and family histories and let her know of advances in cancer genetics that may benefit this family.   Our contact number was provided. Ms. Kaman questions were answered to her satisfaction, and she knows she is welcome to call us at anytime with additional questions or concerns.   Roma Kayser, [place], [facility] Licensed, Certified Genetic Counselor [email or web address]

## 2019-12-15 ENCOUNTER — Other Ambulatory Visit: Payer: Self-pay

## 2019-12-15 ENCOUNTER — Ambulatory Visit
Admission: RE | Admit: 2019-12-15 | Discharge: 2019-12-15 | Disposition: A | Discharge status: Home / Self Care | Payer: 59 | Source: Ambulatory Visit | Attending: Oncology | Admitting: Oncology

## 2019-12-15 DIAGNOSIS — C50412 Malignant neoplasm of upper-outer quadrant of left female breast: Secondary | ICD-10-CM

## 2019-12-15 DIAGNOSIS — D6861 Antiphospholipid syndrome: Secondary | ICD-10-CM

## 2019-12-25 ENCOUNTER — Ambulatory Visit (INDEPENDENT_AMBULATORY_CARE_PROVIDER_SITE_OTHER): Payer: 59 | Admitting: *Deleted

## 2019-12-25 DIAGNOSIS — I5042 Chronic combined systolic (congestive) and diastolic (congestive) heart failure: Secondary | ICD-10-CM

## 2019-12-25 LAB — CUP PACEART REMOTE DEVICE CHECK
Battery Remaining Longevity: 96 mo
Battery Remaining Percentage: 100 %
Brady Statistic RA Percent Paced: 0 %
Brady Statistic RV Percent Paced: 100 %
Date Time Interrogation Session: 20210319010200
HighPow Impedance: 74 Ohm
Implantable Lead Implant Date: 20110301
Implantable Lead Implant Date: 20110301
Implantable Lead Implant Date: 20110301
Implantable Lead Location: 753858
Implantable Lead Location: 753859
Implantable Lead Location: 753860
Implantable Lead Model: 185
Implantable Lead Model: 350
Implantable Lead Model: 4592
Implantable Lead Serial Number: 131585
Implantable Lead Serial Number: 28731977
Implantable Lead Serial Number: 327266
Implantable Pulse Generator Implant Date: 20180130
Lead Channel Impedance Value: 564 Ohm
Lead Channel Impedance Value: 581 Ohm
Lead Channel Impedance Value: 643 Ohm
Lead Channel Pacing Threshold Amplitude: 0.5 V
Lead Channel Pacing Threshold Amplitude: 0.6 V
Lead Channel Pacing Threshold Amplitude: 0.9 V
Lead Channel Pacing Threshold Pulse Width: 0.4 ms
Lead Channel Pacing Threshold Pulse Width: 0.4 ms
Lead Channel Pacing Threshold Pulse Width: 0.4 ms
Lead Channel Setting Pacing Amplitude: 1.1 V
Lead Channel Setting Pacing Amplitude: 2 V
Lead Channel Setting Pacing Amplitude: 2 V
Lead Channel Setting Pacing Pulse Width: 0.4 ms
Lead Channel Setting Pacing Pulse Width: 0.4 ms
Lead Channel Setting Sensing Sensitivity: 0.6 mV
Lead Channel Setting Sensing Sensitivity: 1 mV
Pulse Gen Serial Number: 123793

## 2019-12-25 NOTE — Progress Notes (Signed)
ICD Remote  

## 2020-02-08 ENCOUNTER — Other Ambulatory Visit: Payer: 59

## 2020-02-08 ENCOUNTER — Ambulatory Visit: Payer: 59 | Admitting: Oncology

## 2020-03-14 ENCOUNTER — Encounter: Payer: Self-pay | Admitting: Cardiology

## 2020-03-14 ENCOUNTER — Ambulatory Visit: Payer: 59 | Admitting: Cardiology

## 2020-03-14 ENCOUNTER — Other Ambulatory Visit: Payer: Self-pay

## 2020-03-14 VITALS — BP 116/62 | HR 77 | Ht 65.0 in | Wt 241.0 lb

## 2020-03-14 DIAGNOSIS — E1169 Type 2 diabetes mellitus with other specified complication: Secondary | ICD-10-CM

## 2020-03-14 DIAGNOSIS — T451X5A Adverse effect of antineoplastic and immunosuppressive drugs, initial encounter: Secondary | ICD-10-CM

## 2020-03-14 DIAGNOSIS — I5042 Chronic combined systolic (congestive) and diastolic (congestive) heart failure: Secondary | ICD-10-CM

## 2020-03-14 DIAGNOSIS — Z7189 Other specified counseling: Secondary | ICD-10-CM

## 2020-03-14 DIAGNOSIS — I428 Other cardiomyopathies: Secondary | ICD-10-CM

## 2020-03-14 DIAGNOSIS — I427 Cardiomyopathy due to drug and external agent: Secondary | ICD-10-CM

## 2020-03-14 DIAGNOSIS — E782 Mixed hyperlipidemia: Secondary | ICD-10-CM

## 2020-03-14 DIAGNOSIS — E669 Obesity, unspecified: Secondary | ICD-10-CM

## 2020-03-14 NOTE — Progress Notes (Signed)
Cardiology Office Note:    Date:  03/14/2020   ID:  [name], DOB 11/17/1960, MRN [id number]  PCP:  Wenda Low, MD  Cardiologist:  Buford Dresser, MD  Referring MD: Wenda Low, MD   CC: follow up  History of Present Illness:    Jordan Daugherty is a 59 y.o. female with a hx of Nonischemic cardiomyopathy who is seen in follow up today. I initially saw her 08/05/19 as a new patient to me at the request of Wenda Low, MD for the evaluation and management of nonischemic cardiomyopathy. She was previously followed by Dr. Boyd Kerbs in [place].  Cardiac history: moved to [facility] in June 2020, had been living in [place] prior to this. She has a PMH of NICM 2/2 chemotherapy for breast cancer. Initially had fatigue, treated for bronchitis for 6 mos prior to diagnosis of heart failure. Has done well with watching salt,  Increasing activity, and CRT therapy. Has done really well on entresto but since moving to [place] her copay is $700/month. Started on jardiance, takes lasix PRN.  Today: On 25 mg jardiance (upped from 10 mg we initially started), followed by PCP. Now off letrozole.  Mild leg cramping intermittently at night, better if she stretches her ankles. No LE edema. Not requiring lasix. Tolerating entresto and spironolactone.   Weight has been largely stable. Walks three times/week for about 45 minutes (1.5 miles).   Denies chest pain, shortness of breath at rest or with normal exertion. No PND, orthopnea, LE edema or unexpected weight gain. No syncope or palpitations.  Past Medical History:  Diagnosis Date  . Breast cancer ([place]) 2007   left  . Chronic systolic dysfunction of left ventricle   . Dyslipidemia   . Family history of ovarian cancer   . Family history of prostate cancer   . Family history of rectal cancer   . Family history of uterine cancer   . Hypothyroidism   . Morbid obesity with BMI of 40.0-44.9, adult ([place])   . Nonischemic cardiomyopathy ([place])    . OSA on CPAP   . Personal history of radiation therapy 2007   left  . Type II diabetes mellitus ([place])     Past Surgical History:  Procedure Laterality Date  . BREAST BIOPSY Left 2007  . ICD IMPLANT  11/06/2016   [employer] Dynagen BiV ICD implanted for primary prevention    Current Medications: Current Outpatient Medications on File Prior to Visit  Medication Sig  . acetaminophen (TYLENOL) 500 MG tablet Take 500 mg by mouth every 6 (six) hours as needed.  Marland Kitchen aspirin EC 81 MG tablet Take 81 mg by mouth daily.  Marland Kitchen atorvastatin (LIPITOR) 40 MG tablet Take 40 mg by mouth daily.  . Calcium Acetate, Phos Binder, (CALCIUM ACETATE PO) Take 400 mg by mouth daily.  . carvedilol (COREG) 25 MG tablet Take 25 mg by mouth 2 (two) times daily with a meal.  . cetirizine (ZYRTEC) 10 MG chewable tablet Chew 10 mg by mouth daily.  . fluticasone (VERAMYST) 27.5 MCG/SPRAY nasal spray Place 1 spray into the nose daily.  . furosemide (LASIX) 40 MG tablet Take 40 mg by mouth daily as needed.  . gabapentin (NEURONTIN) 300 MG capsule Take 300 mg by mouth 2 (two) times daily.  Marland Kitchen glucose blood (FREESTYLE LITE) test strip 1 each by Other route as needed for other. Use as instructed  . levothyroxine (SYNTHROID) 75 MCG tablet Take 75 mcg by mouth daily before breakfast.  .  metFORMIN (GLUCOPHAGE) 1000 MG tablet Take 1,000 mg by mouth 2 (two) times daily with a meal.  . Multiple Vitamin (MULTIVITAMIN) tablet Take 1 tablet by mouth daily.  . potassium chloride SA (K-DUR) 20 MEQ tablet Take 20 mEq by mouth 2 (two) times daily.  . sacubitril-valsartan (ENTRESTO) 49-51 MG Take 1 tablet by mouth 2 (two) times daily.  Marland Kitchen spironolactone (ALDACTONE) 25 MG tablet Take 25 mg by mouth daily.  Marland Kitchen JARDIANCE 25 MG TABS tablet Take 25 mg by mouth daily.   No current facility-administered medications on file prior to visit.     Allergies:   Pregabalin and Ramipril   Social History   Tobacco Use  . Smoking status:  Never Smoker  . Smokeless tobacco: Never Used  Substance Use Topics  . Alcohol use: Not Currently  . Drug use: Not on file    Family History: family history includes Dementia in her father; Diabetes in her paternal grandfather; Heart disease in her paternal grandfather; Ovarian cancer (age of onset: 40) in an other family member; Prostate cancer (age of onset: 90) in her father; Rectal cancer in an other family member; Skin cancer (age of onset: 31) in her brother; Uterine cancer (age of onset: 45) in her paternal grandmother.  ROS:   Please see the history of present illness.  Additional pertinent ROS otherwise unremarkable  EKGs/Labs/Other Studies Reviewed:    The following studies were reviewed today: Echo 07/10/2018 (care everywhere) Study Conclusions  - Left ventricle: The cavity size is normal. Wall thickness is  normal. The estimated ejection fraction was in the range of 40%  to 45%. Doppler parameters are consistent with abnormal left  ventricular relaxation (grade 1 diastolic dysfunction). The  global longitudinal strain was -8.54%. - Right ventricle: Pacer wire noted in right ventricle. - Inferior vena cava: The vessel was normal in size; the  respirophasic diameter changes were in the normal range (</= 50%);  findings are consistent with normal central venous pressure.  ------------------------------------------------------------------- Cardiac Anatomy  Left ventricle: - The cavity size is normal. Wall thickness is normal. The  estimated ejection fraction was in the range of 40% to 45%. The  global longitudinal strain was -8.54%. - Doppler parameters are consistent with abnormal left ventricular  relaxation (grade 1 diastolic dysfunction).  Aortic valve: - The valve was structurally normal. Cusp separation was normal. Doppler: - Transvalvular velocity is within the normal range. There is no  stenosis. There was no significant regurgitation. - The ratio  of LVOT to aortic valve peak velocity is 0.52.  AORTA: Aortic root: The aortic root is not dilated.  Mitral valve: - The valve is structurally normal. Leaflet separation was normal. Doppler: - Transvalvular velocity is within the normal range. There is no  evidence for stenosis. There was trivial regurgitation. - The mean diastolic gradient is 30mm Hg.  Left atrium: - The atrium is normal in size.  Right ventricle: - The cavity size is normal. Wall thickness is normal. Pacer wire  noted in right ventricle. Systolic function is normal.  Pulmonic valve: - The valve was structurally normal. Doppler: - There was trivial regurgitation.  Tricuspid valve: - The valve was structurally normal. Leaflet separation was normal. Doppler: - Transvalvular velocity is within the normal range. There is no  evidence for stenosis. There was trivial regurgitation.  Pulmonary artery: - Not well visualized.  Right atrium: - The atrium is normal in size.  Pericardium: - The pericardium was normal in appearance. There is no pericardial  effusion.  Systemic veins: Inferior vena cava: - The vessel was normal in size; the respirophasic diameter changes  were in the normal range (&gt;= 50%); findings are consistent with  normal central venous pressure. - The internal diameter is 1.2cm. - The proximal internal diameter during inspiration is 0.3cm.  EKG:  EKG is personally reviewed.  The ekg ordered today demonstrates a-sensed, biv-paced rhythm at 77 bpm  Recent Labs: 11/10/2019: ALT 23; BUN 16; Creatinine, Ser 0.93; Hemoglobin 13.9; Platelets 340; Potassium 3.9; Sodium 140  Recent Lipid Panel No results found for: CHOL, TRIG, HDL, CHOLHDL, VLDL, LDLCALC, LDLDIRECT  Physical Exam:    VS:  BP 116/62   Pulse 77   Ht 5\' 5"  (1.651 m)   Wt 241 lb (109.3 kg)   SpO2 94%   BMI 40.10 kg/m     Wt Readings from Last 3 Encounters:  03/14/20 241 lb (109.3 kg)  10/26/19 243 lb 1.6 oz (110.3  kg)  09/15/19 243 lb (110.2 kg)    GEN: Well nourished, well developed in no acute distress HEENT: Normal, moist mucous membranes NECK: No JVD CARDIAC: regular rhythm, normal S1 and S2, no rubs or gallops. No murmur. VASCULAR: Radial and DP pulses 2+ bilaterally. No carotid bruits RESPIRATORY:  Clear to auscultation without rales, wheezing or rhonchi  ABDOMEN: Soft, non-tender, non-distended MUSCULOSKELETAL:  Ambulates independently SKIN: Warm and dry, no edema NEUROLOGIC:  Alert and oriented x 3. No focal neuro deficits noted. PSYCHIATRIC:  Normal affect   ASSESSMENT:    1. Chronic combined systolic and diastolic heart failure ([place])   2. Nonischemic cardiomyopathy ([place])   3. 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 Cardiac risk counseling   5. Mixed hyperlipidemia   6. Diabetes mellitus type 2 in obese ([place])   7. Counseling on health promotion and disease prevention    PLAN:    Nonischemic cardiomyopathy: likely 2/2 anthracycline treatment for breast cancer -NYHA class I-II -s/p CRT, followed by Dr. Rayann Heman -Continue entresto 49-51 mg dose BID.  -tolerating carvedilol 25 mg BID -tolerating spironolactone 25 mg daily. Given low BP, and that this is nonischemic, if BP remains low would drop this medication first -tolerating jardiance -lasix as needed only, has potassium supplements, last K 3.9 per [facility]  History of breast cancer, in remission, treated with lumpectomy, sentinel lymph node, adriamycin, cytoxan, taxol, radiation. Treated with tamoxifen x 5years, no longer on letrozole -follows with Dr. Jana Hakim for monitoring  Mixed hyperlipidemia: -continue atorvastatin 40 mg daily  OSA: -continue CPAP  Type II diabetes -continue jardiance and metformin -continue aspirin  Cardiac risk counseling and prevention recommendations: -recommend heart healthy/Mediterranean diet, with whole grains, fruits, vegetable, fish, lean meats, nuts, and olive oil. Limit  salt. -recommend moderate walking, 3-5 times/week for 30-50 minutes each session. Aim for at least 150 minutes.week. Goal should be pace of 3 miles/hours, or walking 1.5 miles in 30 minutes -recommend avoidance of tobacco products. Avoid excess alcohol.  Plan for follow up: 6 mos or sooner PRN  Medication Adjustments/Labs and Tests Ordered: Current medicines are reviewed at length with the patient today.  Concerns regarding medicines are outlined above.  Orders Placed This Encounter  Procedures  . EKG 12-Lead   No orders of the defined types were placed in this encounter.   Patient Instructions  Medication Instructions:  Your Physician recommend you continue on your current medication as directed.    *If you need a refill on your cardiac medications before your next appointment, please call  your pharmacy*   Lab Work: None   Testing/Procedures: None   Follow-Up: At [facility], you and your health needs are our priority.  As part of our continuing mission to provide you with exceptional heart care, we have created designated Provider Care Teams.  These Care Teams include your primary Cardiologist (physician) and Advanced Practice Providers (APPs -  Physician Assistants and Nurse Practitioners) who all work together to provide you with the care you need, when you need it.  We recommend signing up for the patient portal called "MyChart".  Sign up information is provided on this After Visit Summary.  MyChart is used to connect with patients for Virtual Visits (Telemedicine).  Patients are able to view lab/test results, encounter notes, upcoming appointments, etc.  Non-urgent messages can be sent to your provider as well.   To learn more about what you can do with MyChart, go to [email or web address].    Your next appointment:   6 month(s)  The format for your next appointment:   In Person  Provider:   Buford Dresser, MD      Signed, Buford Dresser, MD  PhD 03/14/2020   [place]

## 2020-03-14 NOTE — Patient Instructions (Signed)

## 2020-03-25 ENCOUNTER — Ambulatory Visit (INDEPENDENT_AMBULATORY_CARE_PROVIDER_SITE_OTHER): Payer: 59 | Admitting: *Deleted

## 2020-03-25 DIAGNOSIS — I5042 Chronic combined systolic (congestive) and diastolic (congestive) heart failure: Secondary | ICD-10-CM | POA: Diagnosis not present

## 2020-03-25 LAB — CUP PACEART REMOTE DEVICE CHECK
Battery Remaining Longevity: 96 mo
Battery Remaining Percentage: 100 %
Brady Statistic RA Percent Paced: 0 %
Brady Statistic RV Percent Paced: 100 %
Date Time Interrogation Session: 20210618001000
HighPow Impedance: 85 Ohm
Implantable Lead Implant Date: 20110301
Implantable Lead Implant Date: 20110301
Implantable Lead Implant Date: 20110301
Implantable Lead Location: 753858
Implantable Lead Location: 753859
Implantable Lead Location: 753860
Implantable Lead Model: 185
Implantable Lead Model: 350
Implantable Lead Model: 4592
Implantable Lead Serial Number: 131585
Implantable Lead Serial Number: 28731977
Implantable Lead Serial Number: 327266
Implantable Pulse Generator Implant Date: 20180130
Lead Channel Impedance Value: 557 Ohm
Lead Channel Impedance Value: 589 Ohm
Lead Channel Impedance Value: 648 Ohm
Lead Channel Pacing Threshold Amplitude: 0.5 V
Lead Channel Pacing Threshold Amplitude: 0.6 V
Lead Channel Pacing Threshold Amplitude: 0.9 V
Lead Channel Pacing Threshold Pulse Width: 0.4 ms
Lead Channel Pacing Threshold Pulse Width: 0.4 ms
Lead Channel Pacing Threshold Pulse Width: 0.4 ms
Lead Channel Setting Pacing Amplitude: 1.1 V
Lead Channel Setting Pacing Amplitude: 2 V
Lead Channel Setting Pacing Amplitude: 2 V
Lead Channel Setting Pacing Pulse Width: 0.4 ms
Lead Channel Setting Pacing Pulse Width: 0.4 ms
Lead Channel Setting Sensing Sensitivity: 0.6 mV
Lead Channel Setting Sensing Sensitivity: 1 mV
Pulse Gen Serial Number: 123793

## 2020-03-25 NOTE — Progress Notes (Signed)
Remote ICD transmission.   

## 2020-05-31 ENCOUNTER — Encounter: Payer: Self-pay | Admitting: Cardiology

## 2020-05-31 DIAGNOSIS — I428 Other cardiomyopathies: Secondary | ICD-10-CM | POA: Insufficient documentation

## 2020-05-31 DIAGNOSIS — E782 Mixed hyperlipidemia: Secondary | ICD-10-CM | POA: Insufficient documentation

## 2020-05-31 DIAGNOSIS — E1169 Type 2 diabetes mellitus with other specified complication: Secondary | ICD-10-CM | POA: Insufficient documentation

## 2020-06-15 ENCOUNTER — Telehealth: Payer: Self-pay | Admitting: *Deleted

## 2020-06-15 NOTE — Telephone Encounter (Signed)
[facility] ICD alert received for shock impedance out of range, trending around 123 ohms. [place] 0185 HV lead (dual coil), implanted in 2011. Gradual increase noted over ~5 months. RV pace impedance trend stable. Primary prevention implant indication. No ventricular arrhythmias noted since last in-clinic check on 06/26/19. Pt has upcoming appointment with Dr. Rayann Heman on 06/27/20. Per Karleen Dolphin, [employer], likely due to calcification on HV coil. May be able reprogram shock vector with lower impedance. Pt alert is off in device.  Discussed with Dr. Rayann Heman. Plan to assess further at upcoming [place].

## 2020-06-24 ENCOUNTER — Ambulatory Visit (INDEPENDENT_AMBULATORY_CARE_PROVIDER_SITE_OTHER): Payer: 59 | Admitting: *Deleted

## 2020-06-24 ENCOUNTER — Telehealth: Payer: Self-pay

## 2020-06-24 DIAGNOSIS — I428 Other cardiomyopathies: Secondary | ICD-10-CM

## 2020-06-24 NOTE — Telephone Encounter (Signed)
Left a message regarding virtual appt on 06/27/20.

## 2020-06-26 LAB — CUP PACEART REMOTE DEVICE CHECK
Battery Remaining Longevity: 96 mo
Battery Remaining Percentage: 100 %
Brady Statistic RA Percent Paced: 0 %
Brady Statistic RV Percent Paced: 100 %
Date Time Interrogation Session: 20210917001200
HighPow Impedance: 128 Ohm
Implantable Lead Implant Date: 20110301
Implantable Lead Implant Date: 20110301
Implantable Lead Implant Date: 20110301
Implantable Lead Location: 753858
Implantable Lead Location: 753859
Implantable Lead Location: 753860
Implantable Lead Model: 185
Implantable Lead Model: 350
Implantable Lead Model: 4592
Implantable Lead Serial Number: 131585
Implantable Lead Serial Number: 28731977
Implantable Lead Serial Number: 327266
Implantable Pulse Generator Implant Date: 20180130
Lead Channel Impedance Value: 591 Ohm
Lead Channel Impedance Value: 605 Ohm
Lead Channel Impedance Value: 708 Ohm
Lead Channel Pacing Threshold Amplitude: 0.5 V
Lead Channel Pacing Threshold Amplitude: 0.6 V
Lead Channel Pacing Threshold Amplitude: 0.9 V
Lead Channel Pacing Threshold Pulse Width: 0.4 ms
Lead Channel Pacing Threshold Pulse Width: 0.4 ms
Lead Channel Pacing Threshold Pulse Width: 0.4 ms
Lead Channel Setting Pacing Amplitude: 1.1 V
Lead Channel Setting Pacing Amplitude: 2 V
Lead Channel Setting Pacing Amplitude: 2 V
Lead Channel Setting Pacing Pulse Width: 0.4 ms
Lead Channel Setting Pacing Pulse Width: 0.4 ms
Lead Channel Setting Sensing Sensitivity: 0.6 mV
Lead Channel Setting Sensing Sensitivity: 1 mV
Pulse Gen Serial Number: 123793

## 2020-06-27 ENCOUNTER — Telehealth (INDEPENDENT_AMBULATORY_CARE_PROVIDER_SITE_OTHER): Payer: 59 | Admitting: Internal Medicine

## 2020-06-27 ENCOUNTER — Other Ambulatory Visit: Payer: Self-pay

## 2020-06-27 ENCOUNTER — Telehealth: Payer: Self-pay | Admitting: *Deleted

## 2020-06-27 DIAGNOSIS — I5042 Chronic combined systolic (congestive) and diastolic (congestive) heart failure: Secondary | ICD-10-CM | POA: Diagnosis not present

## 2020-06-27 DIAGNOSIS — I428 Other cardiomyopathies: Secondary | ICD-10-CM

## 2020-06-27 NOTE — Telephone Encounter (Signed)
-----   Message from Thompson Grayer, MD sent at 06/27/2020  2:10 PM EDT ----- Otila Kluver, please order Bmet, mg and CXR with PA/LAT   Raquel Sarna, please bring her in on the device clinic for an in-office check We may be able to reconfigure the shock vector.   [place], [place] says we could do the device check on this coming Wed 9/22 at 11:30 am.

## 2020-06-27 NOTE — Progress Notes (Signed)
Electrophysiology TeleHealth Note   Due to national recommendations of social distancing due to COVID 19, an audio/video telehealth visit is felt to be most appropriate for this patient at this time.  See MyChart message from today for the patient's consent to telehealth for [facility].  Date:  06/27/2020   ID:  Jordan Daugherty, DOB 08/23/1961, MRN [id number]  Location: patient's home  Provider location:  [place]  Evaluation Performed: Follow-up visit  PCP:  Wenda Low, MD   Electrophysiologist:  Dr Rayann Heman  Chief Complaint:  palpitations  History of Present Illness:    Jordan Daugherty is a 59 y.o. female who presents via telehealth conferencing today.  Since last being seen in our clinic, the patient reports doing very well.  Today, she denies symptoms of palpitations, chest pain, shortness of breath,  lower extremity edema, dizziness, presyncope, or syncope.  The patient is otherwise without complaint today.   Past Medical History:  Diagnosis Date  . Breast cancer ([place]) 2007   left  . Chronic systolic dysfunction of left ventricle   . Dyslipidemia   . Family history of ovarian cancer   . Family history of prostate cancer   . Family history of rectal cancer   . Family history of uterine cancer   . Hypothyroidism   . Morbid obesity with BMI of 40.0-44.9, adult ([place])   . Nonischemic cardiomyopathy ([place])   . OSA on CPAP   . Personal history of radiation therapy 2007   left  . Type II diabetes mellitus ([place])     Past Surgical History:  Procedure Laterality Date  . BREAST BIOPSY Left 2007  . ICD IMPLANT  11/06/2016   [employer] Dynagen BiV ICD implanted for primary prevention    Current Outpatient Medications  Medication Sig Dispense Refill  . acetaminophen (TYLENOL) 500 MG tablet Take 500 mg by mouth every 6 (six) hours as needed.    Marland Kitchen aspirin EC 81 MG tablet Take 81 mg by mouth daily.    Marland Kitchen atorvastatin (LIPITOR) 40 MG tablet Take 40 mg by  mouth daily.    . Calcium Acetate, Phos Binder, (CALCIUM ACETATE PO) Take 400 mg by mouth daily.    . carvedilol (COREG) 25 MG tablet Take 25 mg by mouth 2 (two) times daily with a meal.    . cetirizine (ZYRTEC) 10 MG chewable tablet Chew 10 mg by mouth daily.    . fluticasone (VERAMYST) 27.5 MCG/SPRAY nasal spray Place 1 spray into the nose daily.    . furosemide (LASIX) 40 MG tablet Take 40 mg by mouth daily as needed.    . gabapentin (NEURONTIN) 300 MG capsule Take 300 mg by mouth 2 (two) times daily.    Marland Kitchen glucose blood (FREESTYLE LITE) test strip 1 each by Other route as needed for other. Use as instructed    . JARDIANCE 25 MG TABS tablet Take 25 mg by mouth daily.    Marland Kitchen levothyroxine (SYNTHROID) 75 MCG tablet Take 75 mcg by mouth daily before breakfast.    . metFORMIN (GLUCOPHAGE) 1000 MG tablet Take 1,000 mg by mouth 2 (two) times daily with a meal.    . Multiple Vitamin (MULTIVITAMIN) tablet Take 1 tablet by mouth daily.    . potassium chloride SA (K-DUR) 20 MEQ tablet Take 20 mEq by mouth 2 (two) times daily.    . sacubitril-valsartan (ENTRESTO) 49-51 MG Take 1 tablet by mouth 2 (two) times daily.    Marland Kitchen spironolactone (ALDACTONE)  25 MG tablet Take 25 mg by mouth daily.     No current facility-administered medications for this visit.    Allergies:   Pregabalin and Ramipril   Social History:  The patient  reports that she has never smoked. She has never used smokeless tobacco. She reports previous alcohol use.   ROS:  Please see the history of present illness.   All other systems are personally reviewed and negative.    Exam:    Vital Signs:  There were no vitals taken for this visit.  Well sounding and appearing, alert and conversant, regular work of breathing,  good skin color Eyes- anicteric, neuro- grossly intact, skin- no apparent rash or lesions or cyanosis, mouth- oral mucosa is pink  Labs/Other Tests and Data Reviewed:    Recent Labs: 11/10/2019: ALT 23; BUN 16;  Creatinine, Ser 0.93; Hemoglobin 13.9; Platelets 340; Potassium 3.9; Sodium 140   Wt Readings from Last 3 Encounters:  03/14/20 241 lb (109.3 kg)  10/26/19 243 lb 1.6 oz (110.3 kg)  09/15/19 243 lb (110.2 kg)     Last device remote is reviewed from [facility] PDF which reveals normal device function, no arrhythmias,  RV shock impedance has gradually increased over time.   ASSESSMENT & PLAN:    1. Nonischemic CM/ chronic systolic dysfunction Clinically doing well Last echo was 2019 with EF 40-45%. I have advised repeat echo.  She would like to wait until she sees Dr Harrell Gave later this year.  Her RV lead shock impedance has gradually increased recently.  I do not think that this is due to fracture.  Likely due to lead tip encapsulation. I will order cxr, bmet, mg and have her come to the office at the next available time for in office interrogation.  We could potentially adjust her shock vector.   Risks, benefits and potential toxicities for medications prescribed and/or refilled reviewed with patient today.   Follow-up:  Return to see EP PA in a year   Patient Risk:  after full review of this patients clinical status, I feel that they are at moderate risk at this time.  Today, I have spent 15 minutes with the patient with telehealth technology discussing arrhythmia management .    SignedThompson Grayer, MD  06/27/2020 1:59 PM     [place] [address] [phone] (office) [phone] (fax)

## 2020-06-27 NOTE — Progress Notes (Signed)
Remote ICD transmission.   

## 2020-06-27 NOTE — Addendum Note (Signed)
Addended by: Janan Halter F on: 06/27/2020 02:55 PM   Modules accepted: Orders

## 2020-06-27 NOTE — Telephone Encounter (Signed)
Spoke to the patient and scheduled labs on the same day as her device check. Advised after finished here at the [address] office to go over to [facility] at [facility] to get her CXR that has been ordered.   The patient verbalized understanding

## 2020-06-29 ENCOUNTER — Ambulatory Visit
Admission: RE | Admit: 2020-06-29 | Discharge: 2020-06-29 | Disposition: A | Discharge status: Home / Self Care | Payer: 59 | Source: Ambulatory Visit | Attending: Internal Medicine | Admitting: Internal Medicine

## 2020-06-29 ENCOUNTER — Other Ambulatory Visit: Payer: 59

## 2020-06-29 ENCOUNTER — Ambulatory Visit (INDEPENDENT_AMBULATORY_CARE_PROVIDER_SITE_OTHER): Payer: 59 | Admitting: Emergency Medicine

## 2020-06-29 ENCOUNTER — Other Ambulatory Visit: Payer: Self-pay

## 2020-06-29 DIAGNOSIS — Z95810 Presence of automatic (implantable) cardiac defibrillator: Secondary | ICD-10-CM | POA: Diagnosis not present

## 2020-06-29 DIAGNOSIS — I428 Other cardiomyopathies: Secondary | ICD-10-CM

## 2020-06-29 DIAGNOSIS — I519 Heart disease, unspecified: Secondary | ICD-10-CM

## 2020-06-29 DIAGNOSIS — I5042 Chronic combined systolic (congestive) and diastolic (congestive) heart failure: Secondary | ICD-10-CM

## 2020-06-29 LAB — BASIC METABOLIC PANEL
BUN/Creatinine Ratio: 19 (ref 9–23)
BUN: 16 mg/dL (ref 6–24)
CO2: 24 mmol/L (ref 20–29)
Calcium: 10.2 mg/dL (ref 8.7–10.2)
Chloride: 102 mmol/L (ref 96–106)
Creatinine, Ser: 0.83 mg/dL (ref 0.57–1.00)
GFR calc Af Amer: 89 mL/min/{1.73_m2} (ref >59)
GFR calc non Af Amer: 77 mL/min/{1.73_m2} (ref >59)
Glucose: 150 mg/dL — ABNORMAL HIGH (ref 65–99)
Potassium: 4.8 mmol/L (ref 3.5–5.2)
Sodium: 139 mmol/L (ref 134–144)

## 2020-06-29 LAB — MAGNESIUM: Magnesium: 1.7 mg/dL (ref 1.6–2.3)

## 2020-06-29 NOTE — Patient Instructions (Addendum)
Dr. Rayann Heman ordered for you to have an echocardiogram.  You can schedule this appointment at checkout.  You will need to go to [facility] at [place]. [address] for a chest X-ray.  You do not need an appointment.  Their hours are 8:00am-5:00pm, Monday-Friday.

## 2020-07-01 LAB — CUP PACEART INCLINIC DEVICE CHECK
Brady Statistic RA Percent Paced: 1 %
Brady Statistic RV Percent Paced: 100 %
Date Time Interrogation Session: 20210922000000
HighPow Impedance: 131 Ohm
Implantable Lead Implant Date: 20110301
Implantable Lead Implant Date: 20110301
Implantable Lead Implant Date: 20110301
Implantable Lead Location: 753858
Implantable Lead Location: 753859
Implantable Lead Location: 753860
Implantable Lead Model: 185
Implantable Lead Model: 350
Implantable Lead Model: 4592
Implantable Lead Serial Number: 131585
Implantable Lead Serial Number: 28731977
Implantable Lead Serial Number: 327266
Implantable Pulse Generator Implant Date: 20180130
Lead Channel Impedance Value: 623 Ohm
Lead Channel Impedance Value: 625 Ohm
Lead Channel Impedance Value: 738 Ohm
Lead Channel Pacing Threshold Amplitude: 0.6 V
Lead Channel Pacing Threshold Amplitude: 0.6 V
Lead Channel Pacing Threshold Amplitude: 1 V
Lead Channel Pacing Threshold Pulse Width: 0.4 ms
Lead Channel Pacing Threshold Pulse Width: 0.4 ms
Lead Channel Pacing Threshold Pulse Width: 0.4 ms
Lead Channel Sensing Intrinsic Amplitude: 11.7 mV
Lead Channel Sensing Intrinsic Amplitude: 12.6 mV
Lead Channel Sensing Intrinsic Amplitude: 2.6 mV
Lead Channel Setting Pacing Amplitude: 1.1 V
Lead Channel Setting Pacing Amplitude: 2 V
Lead Channel Setting Pacing Amplitude: 2 V
Lead Channel Setting Pacing Pulse Width: 0.4 ms
Lead Channel Setting Pacing Pulse Width: 0.4 ms
Lead Channel Setting Sensing Sensitivity: 0.6 mV
Lead Channel Setting Sensing Sensitivity: 1 mV
Pulse Gen Serial Number: 123793

## 2020-07-01 NOTE — Progress Notes (Signed)
CRT-D device check in office, added-on for elevated HV lead impedance. Thresholds and sensing consistent with previous device measurements. Pacing impedance trends stable over time; HV impedance gradually increased, now trending around 130ohms (vector programmed RV coil to RA coil and can). HV impedance measured 158 ohms RV coil to SVC coil, 150 ohms RV coil to can, and 91 ohms SVC coil to can. No suitable alternate shock vectors based on impedance measurements per [employer]. Max shock impedance alert reprogrammed to 150 ohms (patient alert off). No mode switch episodes recorded. No ventricular arrhythmia episodes recorded. 2 "PMT" episodes appear ST/AT at max track rate of 140bpm. Patient bi-ventricularly pacing 100% of the time. Device programmed with appropriate safety margins. Heart failure diagnostics reviewed and trends are stable for patient. Estimated longevity 8 years. Patient enrolled in remote follow up. Patient education completed including shock plan. Plan for lab work, CXR, and echo per [employer]. [place] on 09/23/20 and ROV pending test results.

## 2020-07-18 ENCOUNTER — Other Ambulatory Visit: Payer: Self-pay

## 2020-07-18 ENCOUNTER — Ambulatory Visit (HOSPITAL_COMMUNITY): Payer: 59 | Attending: Cardiology

## 2020-07-18 DIAGNOSIS — I428 Other cardiomyopathies: Secondary | ICD-10-CM | POA: Diagnosis present

## 2020-07-18 LAB — ECHOCARDIOGRAM COMPLETE
Area-P 1/2: 2.62 cm2
S' Lateral: 2.7 cm

## 2020-08-30 ENCOUNTER — Other Ambulatory Visit: Payer: Self-pay | Admitting: Cardiology

## 2020-09-14 ENCOUNTER — Other Ambulatory Visit: Payer: Self-pay

## 2020-09-14 ENCOUNTER — Ambulatory Visit: Payer: 59 | Admitting: Cardiology

## 2020-09-14 ENCOUNTER — Encounter: Payer: Self-pay | Admitting: Cardiology

## 2020-09-14 VITALS — BP 122/77 | HR 97 | Temp 93.6°F | Ht 65.0 in | Wt 234.4 lb

## 2020-09-14 DIAGNOSIS — I428 Other cardiomyopathies: Secondary | ICD-10-CM | POA: Diagnosis not present

## 2020-09-14 DIAGNOSIS — I427 Cardiomyopathy due to drug and external agent: Secondary | ICD-10-CM

## 2020-09-14 DIAGNOSIS — T451X5A Adverse effect of antineoplastic and immunosuppressive drugs, initial encounter: Secondary | ICD-10-CM

## 2020-09-14 DIAGNOSIS — I5042 Chronic combined systolic (congestive) and diastolic (congestive) heart failure: Secondary | ICD-10-CM | POA: Diagnosis not present

## 2020-09-14 DIAGNOSIS — Z7189 Other specified counseling: Secondary | ICD-10-CM | POA: Diagnosis not present

## 2020-09-14 DIAGNOSIS — E782 Mixed hyperlipidemia: Secondary | ICD-10-CM

## 2020-09-14 DIAGNOSIS — E1169 Type 2 diabetes mellitus with other specified complication: Secondary | ICD-10-CM

## 2020-09-14 DIAGNOSIS — E669 Obesity, unspecified: Secondary | ICD-10-CM

## 2020-09-14 MED ORDER — FUROSEMIDE 40 MG PO TABS: 40.0000 mg | ORAL_TABLET | Freq: Every day | ORAL | 3 refills | Status: DC | PRN

## 2020-09-14 MED ORDER — POTASSIUM CHLORIDE CRYS ER 20 MEQ PO TBCR: 20.0000 meq | EXTENDED_RELEASE_TABLET | Freq: Every day | ORAL | 3 refills | Status: DC | PRN

## 2020-09-14 NOTE — Patient Instructions (Signed)

## 2020-09-14 NOTE — Progress Notes (Signed)
Cardiology Office Note:    Date:  09/14/2020   ID:  Jordan Daugherty, DOB 1961/02/12, MRN [id number]  PCP:  Wenda Low, MD  Cardiologist:  Buford Dresser, MD  Referring MD: Wenda Low, MD   CC: follow up  History of Present Illness:    Jordan Daugherty is a 59 y.o. female with a hx of Nonischemic cardiomyopathy who is seen in follow up today. I initially saw her 08/05/19 as a new patient to me at the request of Wenda Low, MD for the evaluation and management of nonischemic cardiomyopathy. She was previously followed by Dr. Boyd Kerbs in [place].  Cardiac history: moved to [facility] in June 2020, had been living in [place] prior to this. She has a PMH of NICM 2/2 chemotherapy for breast cancer. Initially had fatigue, treated for bronchitis for 6 mos prior to diagnosis of heart failure. Has done well with watching salt,  Increasing activity, and CRT therapy. Has done really well on entresto but since moving to [place] her copay is $700/month. Started on jardiance, takes lasix PRN.  Today: Father is the hospital with metastatic cancer. Offered my condolences. Planning to stay at her parent's house given this, they cook differently. Will refill lasix and potassium as she is not sure how long she will be there. Has seen Dr. Rayann Heman, echo checked and unchanged.  Has not required lasix in about a year.  Denies chest pain, shortness of breath at rest or with normal exertion. No PND, orthopnea, LE edema or unexpected weight gain. No syncope or palpitations.  Past Medical History:  Diagnosis Date  . Breast cancer ([place]) 2007   left  . Chronic systolic dysfunction of left ventricle   . Dyslipidemia   . Family history of ovarian cancer   . Family history of prostate cancer   . Family history of rectal cancer   . Family history of uterine cancer   . Hypothyroidism   . Morbid obesity with BMI of 40.0-44.9, adult ([place])   . Nonischemic cardiomyopathy ([place])   . OSA on CPAP   .  Personal history of radiation therapy 2007   left  . Type II diabetes mellitus ([place])     Past Surgical History:  Procedure Laterality Date  . BREAST BIOPSY Left 2007  . ICD IMPLANT  11/06/2016   [employer] Dynagen BiV ICD implanted for primary prevention    Current Medications: Current Outpatient Medications on File Prior to Visit  Medication Sig  . acetaminophen (TYLENOL) 500 MG tablet Take 500 mg by mouth every 6 (six) hours as needed.  Marland Kitchen aspirin EC 81 MG tablet Take 81 mg by mouth daily.  Marland Kitchen atorvastatin (LIPITOR) 40 MG tablet Take 40 mg by mouth daily.  . Calcium Acetate, Phos Binder, (CALCIUM ACETATE PO) Take 400 mg by mouth daily.  . carvedilol (COREG) 25 MG tablet Take 25 mg by mouth 2 (two) times daily with a meal.  . cetirizine (ZYRTEC) 10 MG chewable tablet Chew 10 mg by mouth daily.  . fluticasone (VERAMYST) 27.5 MCG/SPRAY nasal spray Place 1 spray into the nose daily.  Marland Kitchen gabapentin (NEURONTIN) 300 MG capsule Take 300 mg by mouth 2 (two) times daily.  Marland Kitchen glucose blood (FREESTYLE LITE) test strip 1 each by Other route as needed for other. Use as instructed  . JARDIANCE 25 MG TABS tablet Take 25 mg by mouth daily.  Marland Kitchen levothyroxine (SYNTHROID) 75 MCG tablet Take 75 mcg by mouth daily before breakfast.  . metFORMIN (GLUCOPHAGE) 1000 MG  tablet Take 1,000 mg by mouth 2 (two) times daily with a meal.  . Multiple Vitamin (MULTIVITAMIN) tablet Take 1 tablet by mouth daily.  . sacubitril-valsartan (ENTRESTO) 49-51 MG Take 1 tablet by mouth 2 (two) times daily.  Marland Kitchen spironolactone (ALDACTONE) 25 MG tablet Take 25 mg by mouth daily.   No current facility-administered medications on file prior to visit.     Allergies:   Pregabalin and Ramipril   Social History   Tobacco Use  . Smoking status: Never Smoker  . Smokeless tobacco: Never Used  Substance Use Topics  . Alcohol use: Not Currently  . Drug use: Not on file    Family History: family history includes Dementia in  her father; Diabetes in her paternal grandfather; Heart disease in her paternal grandfather; Ovarian cancer (age of onset: 43) in an other family member; Prostate cancer (age of onset: 20) in her father; Rectal cancer in an other family member; Skin cancer (age of onset: 20) in her brother; Uterine cancer (age of onset: 36) in her paternal grandmother.  ROS:   Please see the history of present illness.  Additional pertinent ROS otherwise unremarkable  EKGs/Labs/Other Studies Reviewed:    The following studies were reviewed today: Echo 07/18/20 1. Left ventricular ejection fraction, by estimation, is 40 to 45%. The  left ventricle has mildly decreased function. The left ventricle  demonstrates regional wall motion abnormalities. Global hypokinesis, more  pronounced at apex. There is mild left  ventricular hypertrophy. Left ventricular diastolic parameters are  consistent with Grade I diastolic dysfunction (impaired relaxation).  2. Right ventricular systolic function is normal. The right ventricular  size is normal. Tricuspid regurgitation signal is inadequate for assessing  PA pressure.  3. The mitral valve is normal in structure. Trivial mitral valve  regurgitation.  4. The aortic valve is tricuspid. Aortic valve regurgitation is not  visualized. No aortic stenosis is present.  5. The inferior vena cava is normal in size with greater than 50%  respiratory variability, suggesting right atrial pressure of 3 mmHg.   Comparison(s): Prior studies done in [place], [place]. Prior EF 40-45% by  Report.  Echo 07/10/2018 (care everywhere) Study Conclusions  - Left ventricle: The cavity size is normal. Wall thickness is  normal. The estimated ejection fraction was in the range of 40%  to 45%. Doppler parameters are consistent with abnormal left  ventricular relaxation (grade 1 diastolic dysfunction). The  global longitudinal strain was -8.54%. - Right ventricle: Pacer wire noted in  right ventricle. - Inferior vena cava: The vessel was normal in size; the  respirophasic diameter changes were in the normal range (</= 50%);  findings are consistent with normal central venous pressure.  ------------------------------------------------------------------- Cardiac Anatomy  Left ventricle: - The cavity size is normal. Wall thickness is normal. The  estimated ejection fraction was in the range of 40% to 45%. The  global longitudinal strain was -8.54%. - Doppler parameters are consistent with abnormal left ventricular  relaxation (grade 1 diastolic dysfunction).  Aortic valve: - The valve was structurally normal. Cusp separation was normal. Doppler: - Transvalvular velocity is within the normal range. There is no  stenosis. There was no significant regurgitation. - The ratio of LVOT to aortic valve peak velocity is 0.52.  AORTA: Aortic root: The aortic root is not dilated.  Mitral valve: - The valve is structurally normal. Leaflet separation was normal. Doppler: - Transvalvular velocity is within the normal range. There is no  evidence for stenosis. There was trivial  regurgitation. - The mean diastolic gradient is 102mm Hg.  Left atrium: - The atrium is normal in size.  Right ventricle: - The cavity size is normal. Wall thickness is normal. Pacer wire  noted in right ventricle. Systolic function is normal.  Pulmonic valve: - The valve was structurally normal. Doppler: - There was trivial regurgitation.  Tricuspid valve: - The valve was structurally normal. Leaflet separation was normal. Doppler: - Transvalvular velocity is within the normal range. There is no  evidence for stenosis. There was trivial regurgitation.  Pulmonary artery: - Not well visualized.  Right atrium: - The atrium is normal in size.  Pericardium: - The pericardium was normal in appearance. There is no pericardial  effusion.  Systemic veins: Inferior vena cava: -  The vessel was normal in size; the respirophasic diameter changes  were in the normal range (&gt;= 50%); findings are consistent with  normal central venous pressure. - The internal diameter is 1.2cm. - The proximal internal diameter during inspiration is 0.3cm.  EKG:  EKG is personally reviewed.  The ekg ordered 03/14/20 demonstrates a-sensed, biv-paced rhythm at 77 bpm  Recent Labs: 11/10/2019: ALT 23; Hemoglobin 13.9; Platelets 340 06/29/2020: BUN 16; Creatinine, Ser 0.83; Magnesium 1.7; Potassium 4.8; Sodium 139  Recent Lipid Panel No results found for: CHOL, TRIG, HDL, CHOLHDL, VLDL, LDLCALC, LDLDIRECT  Physical Exam:    VS:  BP 122/77   Pulse 97   Temp (!) 93.6 F (34.2 C)   Ht 5\' 5"  (1.651 m)   Wt 234 lb 6.4 oz (106.3 kg)   SpO2 95%   BMI 39.01 kg/m     Wt Readings from Last 3 Encounters:  09/14/20 234 lb 6.4 oz (106.3 kg)  03/14/20 241 lb (109.3 kg)  10/26/19 243 lb 1.6 oz (110.3 kg)    GEN: Well nourished, well developed in no acute distress HEENT: Normal, moist mucous membranes NECK: No JVD CARDIAC: regular rhythm, normal S1 and S2, no rubs or gallops. No murmur. VASCULAR: Radial and DP pulses 2+ bilaterally. No carotid bruits RESPIRATORY:  Clear to auscultation without rales, wheezing or rhonchi  ABDOMEN: Soft, non-tender, non-distended MUSCULOSKELETAL:  Ambulates independently SKIN: Warm and dry, no edema NEUROLOGIC:  Alert and oriented x 3. No focal neuro deficits noted. PSYCHIATRIC:  Normal affect   ASSESSMENT:    1. Nonischemic cardiomyopathy ([place])   2. Chronic combined systolic and diastolic heart failure (HCC)   3. 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 Cardiac risk counseling   5. Mixed hyperlipidemia   6. Diabetes mellitus type 2 in obese ([place])   7. Counseling on health promotion and disease prevention    PLAN:    Nonischemic cardiomyopathy: likely 2/2 anthracycline treatment for breast cancer -NYHA class I-II -s/p CRT, followed by Dr.  Rayann Heman -Continue entresto 49-51 mg dose BID.  -tolerating carvedilol 25 mg BID -tolerating spironolactone 25 mg daily. Given low BP, and that this is nonischemic, if BP remains low would drop this medication first -tolerating jardiance -lasix as needed only, use potassium if using lasix. Refilled today  History of breast cancer, in remission, treated with lumpectomy, sentinel lymph node, adriamycin, cytoxan, taxol, radiation. Treated with tamoxifen x 5years, no longer on letrozole -follows with Dr. Jana Hakim for monitoring  Mixed hyperlipidemia: -continue atorvastatin 40 mg daily -lipids per [facility] 07/28/20: Tchol 126, HDL 33, LDL 66, TG 158  OSA: -continue CPAP  Type II diabetes -continue jardiance and metformin -continue aspirin  Cardiac risk counseling and prevention recommendations: -recommend heart  healthy/Mediterranean diet, with whole grains, fruits, vegetable, fish, lean meats, nuts, and olive oil. Limit salt. -recommend moderate walking, 3-5 times/week for 30-50 minutes each session. Aim for at least 150 minutes.week. Goal should be pace of 3 miles/hours, or walking 1.5 miles in 30 minutes -recommend avoidance of tobacco products. Avoid excess alcohol.  Plan for follow up: 6 mos or sooner PRN  Medication Adjustments/Labs and Tests Ordered: Current medicines are reviewed at length with the patient today.  Concerns regarding medicines are outlined above.  No orders of the defined types were placed in this encounter.  Meds ordered this encounter  Medications  . furosemide (LASIX) 40 MG tablet    Sig: Take 1 tablet (40 mg total) by mouth daily as needed.    Dispense:  30 tablet    Refill:  3  . potassium chloride SA (KLOR-CON) 20 MEQ tablet    Sig: Take 1 tablet (20 mEq total) by mouth daily as needed (when you take lasix).    Dispense:  30 tablet    Refill:  3    Patient Instructions  Medication Instructions:  Your Physician recommend you continue on your current  medication as directed.    *If you need a refill on your cardiac medications before your next appointment, please call your pharmacy*   Lab Work: None   Testing/Procedures: None   Follow-Up: At [facility], you and your health needs are our priority.  As part of our continuing mission to provide you with exceptional heart care, we have created designated Provider Care Teams.  These Care Teams include your primary Cardiologist (physician) and Advanced Practice Providers (APPs -  Physician Assistants and Nurse Practitioners) who all work together to provide you with the care you need, when you need it.  We recommend signing up for the patient portal called "MyChart".  Sign up information is provided on this After Visit Summary.  MyChart is used to connect with patients for Virtual Visits (Telemedicine).  Patients are able to view lab/test results, encounter notes, upcoming appointments, etc.  Non-urgent messages can be sent to your provider as well.   To learn more about what you can do with MyChart, go to [email or web address].    Your next appointment:   6 month(s)  The format for your next appointment:   In Person  Provider:   Buford Dresser, MD     Signed, Buford Dresser, MD PhD 09/14/2020   [place]

## 2020-09-15 ENCOUNTER — Other Ambulatory Visit: Payer: Self-pay | Admitting: Cardiology

## 2020-10-10 ENCOUNTER — Ambulatory Visit (INDEPENDENT_AMBULATORY_CARE_PROVIDER_SITE_OTHER): Payer: 59

## 2020-10-10 DIAGNOSIS — I428 Other cardiomyopathies: Secondary | ICD-10-CM

## 2020-10-10 LAB — CUP PACEART REMOTE DEVICE CHECK
Battery Remaining Longevity: 96 mo
Battery Remaining Percentage: 100 %
Brady Statistic RA Percent Paced: 0 %
Brady Statistic RV Percent Paced: 100 %
Date Time Interrogation Session: 20211231122000
HighPow Impedance: 138 Ohm
Implantable Lead Implant Date: 20110301
Implantable Lead Implant Date: 20110301
Implantable Lead Implant Date: 20110301
Implantable Lead Location: 753858
Implantable Lead Location: 753859
Implantable Lead Location: 753860
Implantable Lead Model: 185
Implantable Lead Model: 350
Implantable Lead Model: 4592
Implantable Lead Serial Number: 131585
Implantable Lead Serial Number: 28731977
Implantable Lead Serial Number: 327266
Implantable Pulse Generator Implant Date: 20180130
Lead Channel Impedance Value: 617 Ohm
Lead Channel Impedance Value: 621 Ohm
Lead Channel Impedance Value: 721 Ohm
Lead Channel Pacing Threshold Amplitude: 0.6 V
Lead Channel Pacing Threshold Amplitude: 0.6 V
Lead Channel Pacing Threshold Amplitude: 1 V
Lead Channel Pacing Threshold Pulse Width: 0.4 ms
Lead Channel Pacing Threshold Pulse Width: 0.4 ms
Lead Channel Pacing Threshold Pulse Width: 0.4 ms
Lead Channel Setting Pacing Amplitude: 1.1 V
Lead Channel Setting Pacing Amplitude: 2 V
Lead Channel Setting Pacing Amplitude: 2 V
Lead Channel Setting Pacing Pulse Width: 0.4 ms
Lead Channel Setting Pacing Pulse Width: 0.4 ms
Lead Channel Setting Sensing Sensitivity: 0.6 mV
Lead Channel Setting Sensing Sensitivity: 1 mV
Pulse Gen Serial Number: 123793

## 2020-10-24 NOTE — Progress Notes (Signed)
**Note De-identified Jordan Daugherty Obfuscation** Remote ICD transmission.   

## 2020-10-28 ENCOUNTER — Telehealth: Payer: Self-pay | Admitting: Cardiology

## 2020-10-28 NOTE — Telephone Encounter (Signed)
Per [employer] , just [facility] Remote  I'm still out of town . I will call when I return My father passed away and I am helping the famr Mychart message, Just fyi

## 2020-11-29 ENCOUNTER — Other Ambulatory Visit: Payer: Self-pay | Admitting: Oncology

## 2020-11-29 DIAGNOSIS — Z1231 Encounter for screening mammogram for malignant neoplasm of breast: Secondary | ICD-10-CM

## 2020-12-08 ENCOUNTER — Other Ambulatory Visit: Payer: Self-pay | Admitting: Cardiology

## 2020-12-08 DIAGNOSIS — I5042 Chronic combined systolic (congestive) and diastolic (congestive) heart failure: Secondary | ICD-10-CM

## 2020-12-08 DIAGNOSIS — I428 Other cardiomyopathies: Secondary | ICD-10-CM

## 2021-01-09 ENCOUNTER — Ambulatory Visit (INDEPENDENT_AMBULATORY_CARE_PROVIDER_SITE_OTHER): Payer: 59

## 2021-01-09 DIAGNOSIS — I428 Other cardiomyopathies: Secondary | ICD-10-CM

## 2021-01-14 LAB — CUP PACEART REMOTE DEVICE CHECK
Battery Remaining Longevity: 96 mo
Battery Remaining Percentage: 100 %
Brady Statistic RA Percent Paced: 0 %
Brady Statistic RV Percent Paced: 100 %
Date Time Interrogation Session: 20220407144200
HighPow Impedance: 145 Ohm
Implantable Lead Implant Date: 20110301
Implantable Lead Implant Date: 20110301
Implantable Lead Implant Date: 20110301
Implantable Lead Location: 753858
Implantable Lead Location: 753859
Implantable Lead Location: 753860
Implantable Lead Model: 185
Implantable Lead Model: 350
Implantable Lead Model: 4592
Implantable Lead Serial Number: 131585
Implantable Lead Serial Number: 28731977
Implantable Lead Serial Number: 327266
Implantable Pulse Generator Implant Date: 20180130
Lead Channel Impedance Value: 597 Ohm
Lead Channel Impedance Value: 640 Ohm
Lead Channel Impedance Value: 727 Ohm
Lead Channel Pacing Threshold Amplitude: 0.6 V
Lead Channel Pacing Threshold Amplitude: 0.6 V
Lead Channel Pacing Threshold Amplitude: 1 V
Lead Channel Pacing Threshold Pulse Width: 0.4 ms
Lead Channel Pacing Threshold Pulse Width: 0.4 ms
Lead Channel Pacing Threshold Pulse Width: 0.4 ms
Lead Channel Setting Pacing Amplitude: 1.1 V
Lead Channel Setting Pacing Amplitude: 2 V
Lead Channel Setting Pacing Amplitude: 2 V
Lead Channel Setting Pacing Pulse Width: 0.4 ms
Lead Channel Setting Pacing Pulse Width: 0.4 ms
Lead Channel Setting Sensing Sensitivity: 0.6 mV
Lead Channel Setting Sensing Sensitivity: 1 mV
Pulse Gen Serial Number: 123793

## 2021-01-16 ENCOUNTER — Telehealth: Payer: Self-pay

## 2021-01-16 NOTE — Telephone Encounter (Signed)
Patient called and would like someone call her back and go over her most recent transmission with her

## 2021-01-17 NOTE — Telephone Encounter (Signed)
LMOM to call office. DC # and office hours provided.  Questions concerning last transmission.

## 2021-01-18 ENCOUNTER — Ambulatory Visit
Admission: RE | Admit: 2021-01-18 | Discharge: 2021-01-18 | Disposition: A | Discharge status: Home / Self Care | Payer: 59 | Source: Ambulatory Visit | Attending: Oncology | Admitting: Oncology

## 2021-01-18 ENCOUNTER — Other Ambulatory Visit: Payer: Self-pay

## 2021-01-18 DIAGNOSIS — Z1231 Encounter for screening mammogram for malignant neoplasm of breast: Secondary | ICD-10-CM

## 2021-01-18 NOTE — Telephone Encounter (Signed)
Questions answered concerning 01/12/21 transmission. Alert sytem and 91 day scheduled remote education done. Patient provided with [facility] # for any future questions or concerns.

## 2021-01-19 NOTE — Progress Notes (Signed)
Remote pacemaker transmission.   

## 2021-02-07 ENCOUNTER — Other Ambulatory Visit: Payer: Self-pay

## 2021-02-07 ENCOUNTER — Inpatient Hospital Stay: Payer: 59 | Attending: Oncology | Admitting: Oncology

## 2021-02-07 ENCOUNTER — Encounter: Payer: Self-pay | Admitting: Oncology

## 2021-02-07 VITALS — BP 95/57 | HR 72 | Temp 97.6°F | Resp 18 | Ht 65.0 in | Wt 238.0 lb

## 2021-02-07 DIAGNOSIS — Z8042 Family history of malignant neoplasm of prostate: Secondary | ICD-10-CM | POA: Insufficient documentation

## 2021-02-07 DIAGNOSIS — Z9221 Personal history of antineoplastic chemotherapy: Secondary | ICD-10-CM | POA: Diagnosis not present

## 2021-02-07 DIAGNOSIS — Z8049 Family history of malignant neoplasm of other genital organs: Secondary | ICD-10-CM | POA: Insufficient documentation

## 2021-02-07 DIAGNOSIS — Z780 Asymptomatic menopausal state: Secondary | ICD-10-CM | POA: Diagnosis not present

## 2021-02-07 DIAGNOSIS — I428 Other cardiomyopathies: Secondary | ICD-10-CM | POA: Diagnosis not present

## 2021-02-07 DIAGNOSIS — Z800 Family history of malignant neoplasm of digestive organs: Secondary | ICD-10-CM | POA: Insufficient documentation

## 2021-02-07 DIAGNOSIS — D6861 Antiphospholipid syndrome: Secondary | ICD-10-CM

## 2021-02-07 DIAGNOSIS — C50412 Malignant neoplasm of upper-outer quadrant of left female breast: Secondary | ICD-10-CM | POA: Diagnosis not present

## 2021-02-07 DIAGNOSIS — Z8041 Family history of malignant neoplasm of ovary: Secondary | ICD-10-CM | POA: Insufficient documentation

## 2021-02-07 DIAGNOSIS — Z853 Personal history of malignant neoplasm of breast: Secondary | ICD-10-CM | POA: Insufficient documentation

## 2021-02-07 DIAGNOSIS — Z6841 Body Mass Index (BMI) 40.0 and over, adult: Secondary | ICD-10-CM

## 2021-02-07 DIAGNOSIS — Z95810 Presence of automatic (implantable) cardiac defibrillator: Secondary | ICD-10-CM

## 2021-02-07 DIAGNOSIS — Z808 Family history of malignant neoplasm of other organs or systems: Secondary | ICD-10-CM | POA: Insufficient documentation

## 2021-02-07 DIAGNOSIS — Z923 Personal history of irradiation: Secondary | ICD-10-CM | POA: Diagnosis not present

## 2021-02-07 DIAGNOSIS — Z170 Estrogen receptor positive status [ER+]: Secondary | ICD-10-CM

## 2021-02-07 NOTE — Progress Notes (Signed)
[facility]  Telephone:([phone] Fax:([phone]    Jordan Daugherty: Jordan Daugherty DOB: 1961/06/06  MR#: [id number]  XIP#:[id number]  Patient Care Team: Jordan Low, MD as PCP - General (Internal Medicine) Jordan Dresser, MD as PCP - Cardiology (Cardiology) Jordan Germany, RN as Oncology Nurse Navigator Jordan Kaufmann, RN as Oncology Nurse Navigator Jordan Daugherty, Jordan Dad, MD as Consulting Physician (Medical Oncology) Jordan Grayer, MD as Consulting Physician (Cardiology) Jordan Dresser, MD as Consulting Physician (Cardiology) Jordan Louis, MD as Consulting Physician (Obstetrics and Gynecology) OTHER MD:   CHIEF COMPLAINT: Estrogen receptor positive breast cancer  CURRENT TREATMENT: Observation   INTERVAL HISTORY: Jordan Daugherty returns today for follow up of her history of estrogen receptor positive breast cancer.  Since her last visit, she underwent genetic testing on 11/09/2019. Results were negative.  She also underwent bilateral screening mammography with tomography at [facility] on 01/18/2021 showing: breast density category B; no evidence of malignancy in either breast.    REVIEW OF SYSTEMS: Jordan Daugherty tells me her brother died in January 6734, from complications of COVID.  He had not been vaccinated.  She then lost her father a week later to advanced prostate cancer.  She is grieving appropriately from these losses.  A detailed review of systems today was otherwise noncontributory   COVID 19 VACCINATION STATUS: Fully vaccinated   HISTORY OF CURRENT ILLNESS: From the original intake note:  Jordan Daugherty was referred here by Dr. Wenda [facility] at [facility] for her history of left breast cancer. She was followed by Dr. Luciano Cutter [facility] at [facility].  Unfortunately we have not been able to access the records directly, but from the patient's story, I gather that at age 64 she had a T1-2 N1, anatomic stage IIB invasive breast cancer, most likely  ductal; status post left lumpectomy and axillary lymph node dissection August 2007. She recalls having 2 out of 7 left axillary lymph nodes positive.  The tumor was estrogen and progesterone receptor positive, HER-2 nonamplified.  She was treated with adjuvant chemotherapy which from her details appears to have been cyclophosphamide and doxorubicin in dose dense fashion x4 followed by paclitaxel ("and another drug") again in dose dense fashion x4.  She then received adjuvant radiation for 32 treatments and tamoxifen for 7 years, and letrozole since 2015.  She has tolerated letrozole well except for some hot flashes.   Jordan Daugherty's last bone density screening on 09/05/2017, showed a T-score of 2.2, which is considered normal.    Her subsequent history is as detailed below   PAST MEDICAL HISTORY: Past Medical History:  Diagnosis Date  . Breast cancer ([facility]) 2007   left  . Chronic systolic dysfunction of left ventricle   . Dyslipidemia   . Family history of ovarian cancer   . Family history of prostate cancer   . Family history of rectal cancer   . Family history of uterine cancer   . Hypothyroidism   . Morbid obesity with BMI of 40.0-44.9, adult ([facility])   . Nonischemic cardiomyopathy ([facility])   . OSA on CPAP   . Personal history of radiation therapy 2007   left  . Type II diabetes mellitus ([facility])   Diabetes mellitus, hypothyroidism, hypercholesterolemia   PAST SURGICAL HISTORY: Past Surgical History:  Procedure Laterality Date  . BREAST BIOPSY Left 2007  . BREAST LUMPECTOMY Left 2007  . ICD IMPLANT  11/06/2016   [employer] Dynagen BiV ICD implanted for primary prevention  Status post left lumpectomy  and axillary exploration, status post right oophorectomy   FAMILY HISTORY: Family History  Problem Relation Age of Onset  . Prostate cancer Father 68  . Dementia Father   . Uterine cancer Paternal Grandmother 60  . Diabetes Paternal Grandfather   . Heart disease Paternal  Grandfather   . Skin cancer Brother 82  . Ovarian cancer Other 80       MGMs sister  . Rectal cancer Other        MGMs mother   Jordan Daugherty's father is 28 years old as of October 2020. Patients' mother is 7 years old as of October 2020. The patient has 4 brothers and no sisters. Patient denies anyone in her family having breast, ovarian, prostate, or pancreatic cancer.  A paternal grandmother had uterine cancer at age 68   GYNECOLOGIC HISTORY:  No LMP recorded. Patient is postmenopausal. Menarche: 60 years old GX P 0.  The patient had 13 pregnancies, all stillborn, usually ending in the fourth or fifth month. 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  Her husband Jordan Daugherty works in Engineer, technical sales.  At home is just the 2 of them, with no pets.  The patient attends a nondenominatinal church [employer] stage [employer])   ADVANCED DIRECTIVES: In the absence of any documents to the contrary the patient's husband is her healthcare power of attorney   HEALTH MAINTENANCE: Social History   Tobacco Use  . Smoking status: Never Smoker  . Smokeless tobacco: Never Used  Substance Use Topics  . Alcohol use: Not Currently    Colonoscopy: 08/07/2012  PAP: 118  Bone density: 09/05/2017; 2.2 normal Mammography: Due  Allergies  Allergen Reactions  . Pregabalin     MAKES HER MEAN  . Ramipril Other (See Comments)    cough    Current Outpatient Medications  Medication Sig Dispense Refill  . acetaminophen (TYLENOL) 500 MG tablet Take 500 mg by mouth every 6 (six) hours as needed.    Marland Kitchen aspirin EC 81 MG tablet Take 81 mg by mouth daily.    Marland Kitchen atorvastatin (LIPITOR) 40 MG tablet Take 40 mg by mouth daily.    . Calcium Acetate, Phos Binder, (CALCIUM ACETATE PO) Take 400 mg by mouth daily.    . carvedilol (COREG) 25 MG tablet  Take 25 mg by mouth 2 (two) times daily with a meal.    . cetirizine (ZYRTEC) 10 MG chewable tablet Chew 10 mg by mouth daily.    . fluticasone (VERAMYST) 27.5 MCG/SPRAY nasal spray Place 1 spray into the nose daily.    . furosemide (LASIX) 40 MG tablet TAKE 1 TABLET BY MOUTH DAILY AS NEEDED 90 tablet 0  . gabapentin (NEURONTIN) 300 MG capsule Take 300 mg by mouth 2 (two) times daily.    Marland Kitchen glucose blood (FREESTYLE LITE) test strip 1 each by Other route as needed for other. Use as instructed    . JARDIANCE 25 MG TABS tablet Take 25 mg by mouth daily.    Marland Kitchen KLOR-CON M20 20 MEQ tablet TAKE 1 TABLET (20 MEQ TOTAL) BY MOUTH DAILY AS NEEDED (WHEN YOU TAKE LASIX). 90 tablet 0  . levothyroxine (SYNTHROID) 75 MCG tablet Take 75 mcg by mouth daily before breakfast.    . metFORMIN (GLUCOPHAGE) 1000 MG tablet Take 1,000 mg by mouth 2 (two)  times daily with a meal.    . Multiple Vitamin (MULTIVITAMIN) tablet Take 1 tablet by mouth daily.    . sacubitril-valsartan (ENTRESTO) 49-51 MG Take 1 tablet by mouth 2 (two) times daily.    Marland Kitchen spironolactone (ALDACTONE) 25 MG tablet Take 25 mg by mouth daily.     No current facility-administered medications for this visit.    OBJECTIVE: white woman who appears stated age  77:   02/07/21 1353  BP: (!) 95/57  Pulse: 72  Resp: 18  Temp: 97.6 F (36.4 C)  SpO2: 95%   Wt Readings from Last 3 Encounters:  02/07/21 238 lb (108 kg)  09/14/20 234 lb 6.4 oz (106.3 kg)  03/14/20 241 lb (109.3 kg)   Body mass index is 39.61 kg/m.    ECOG FS:1 - Symptomatic but completely ambulatory  Sclerae unicteric, EOMs intact Wearing a mask No cervical or supraclavicular adenopathy Lungs no rales or rhonchi Heart regular rate and rhythm Abd soft, nontender, positive bowel sounds MSK no focal spinal tenderness, no upper extremity lymphedema Neuro: nonfocal, well oriented, appropriate affect Breasts: The right breast is unremarkable.  The left breast is status post  lumpectomy followed by radiation, with no evidence of local recurrence.  Both axillae are benign   LAB RESULTS:  CMP     Component Value Date/Time   NA 139 06/29/2020 1127   K 4.8 06/29/2020 1127   CL 102 06/29/2020 1127   CO2 24 06/29/2020 1127   GLUCOSE 150 (H) 06/29/2020 1127   GLUCOSE 179 (H) 11/10/2019 1430   BUN 16 06/29/2020 1127   CREATININE 0.83 06/29/2020 1127   CREATININE 1.02 (H) 10/12/2019 1415   CALCIUM 10.2 06/29/2020 1127   PROT 8.1 11/10/2019 1430   ALBUMIN 4.2 11/10/2019 1430   AST 16 11/10/2019 1430   AST 21 10/12/2019 1415   ALT 23 11/10/2019 1430   ALT 25 10/12/2019 1415   ALKPHOS 103 11/10/2019 1430   BILITOT 1.0 11/10/2019 1430   BILITOT 1.0 10/12/2019 1415   GFRNONAA 77 06/29/2020 1127   GFRNONAA >60 10/12/2019 1415   GFRAA 89 06/29/2020 1127   GFRAA >60 10/12/2019 1415    No results found for: TOTALPROTELP, ALBUMINELP, A1GS, A2GS, BETS, BETA2SER, GAMS, MSPIKE, SPEI  No results found for: KPAFRELGTCHN, LAMBDASER, KAPLAMBRATIO  Lab Results  Component Value Date   WBC 9.0 11/10/2019   NEUTROABS 5.7 11/10/2019   HGB 13.9 11/10/2019   HCT 42.4 11/10/2019   MCV 90.4 11/10/2019   PLT 340 11/10/2019    No results found for: LABCA2  No components found for: GOTLXB262  No results for input(s): INR in the last 168 hours.  No results found for: LABCA2  No results found for: MBT597  No results found for: CBU384  No results found for: TXM468  No results found for: CA2729  No components found for: HGQUANT  No results found for: CEA1 / No results found for: CEA1   No results found for: AFPTUMOR  No results found for: CHROMOGRNA  No results found for: HGBA, HGBA2QUANT, HGBFQUANT, HGBSQUAN (Hemoglobinopathy evaluation)   No results found for: LDH  No results found for: IRON, TIBC, IRONPCTSAT (Iron and TIBC)  No results found for: FERRITIN  Urinalysis No results found for: COLORURINE, APPEARANCEUR, LABSPEC, PHURINE, GLUCOSEU,  HGBUR, BILIRUBINUR, KETONESUR, PROTEINUR, UROBILINOGEN, NITRITE, LEUKOCYTESUR   STUDIES:  MM 3D SCREEN BREAST BILATERAL  Result Date: 01/18/2021 CLINICAL DATA:  Screening. History of LEFT breast cancer and lumpectomy in 2007. EXAM: DIGITAL SCREENING  BILATERAL MAMMOGRAM WITH TOMOSYNTHESIS AND CAD TECHNIQUE: Bilateral screening digital craniocaudal and mediolateral oblique mammograms were obtained. Bilateral screening digital breast tomosynthesis was performed. The images were evaluated with computer-aided detection. COMPARISON:  Previous exam(s). ACR Breast Density Category b: There are scattered areas of fibroglandular density. FINDINGS: There are no findings suspicious for malignancy. LEFT lumpectomy changes are again noted. The images were evaluated with computer-aided detection. IMPRESSION: No mammographic evidence of malignancy. A result letter of this screening mammogram will be mailed directly to the patient. RECOMMENDATION: Screening mammogram in one year. (Code:SM-B-01Y) BI-RADS CATEGORY  2: Benign. Electronically Signed   By: Margarette Canada M.D.   On: 01/18/2021 13:32   CUP PACEART REMOTE DEVICE CHECK  Result Date: 01/14/2021 Scheduled remote reviewed. 1 ATR event duration 2:36 hrs appears a fib, this appears to be new finding.  A burden < 1%.  Histogram controlled.  Shock impedance measured 145 ohms, known upward trending.  Meds: ASA, Coreg, Entresto.  Forwarding to triage for further review.  Normal device function.  R. Powers, CVRS Next remote 91 days.    ELIGIBLE FOR AVAILABLE RESEARCH PROTOCOL: no   ASSESSMENT: 60 y.o. [place], [place] woman status post left lumpectomy and axillary lymph node dissection August 2008 for a T1, N1 invasive breast cancer, estrogen and progesterone receptor positive, HER-2 not amplified  (a) 2 out of 7 lymph nodes were involved  (1) status post adjuvant chemotherapy with cyclophosphamide/doxorubicin in dose dense fashion x4 followed by paclitaxel ("and another  drug") in dose dense fashion x4  (2) adjuvant radiation given for total of 32 treatments after chemotherapy  (3) antiestrogens included tamoxifen for 7-10 years, then letrozole starting 2015, stopping January 2021  (4) genetics testing 11/19/2019 through the  Common Hereditary Gene Panel offered by [facility] found no deleterious mutations in APC, ATM, AXIN2, BARD1, BMPR1A, BRCA1, BRCA2, BRIP1, CDH1, CDK4, CDKN2A (p14ARF), CDKN2A (p16INK4a), CHEK2, CTNNA1, DICER1, EPCAM (Deletion/duplication testing only), GREM1 (promoter region deletion/duplication testing only), KIT, MEN1, MLH1, MSH2, MSH3, MSH6, MUTYH, NBN, NF1, NHTL1, PALB2, PDGFRA, PMS2, POLD1, POLE, PTEN, RAD50, RAD51C, RAD51D, RNF43, SDHB, SDHC, SDHD, SMAD4, SMARCA4. STK11, TP53, TSC1, TSC2, and VHL.  The following genes were evaluated for sequence changes only: SDHA and HOXB13 c.251G>A variant only.    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  This is very favorable.  I expressed our sympathy on the recent loss of her father and brother.  I encouraged her to "graduate" from follow-up here and she is agreeable.  However she would like to participate on our survivorship program and this is very reasonable.  She also requested referral to a gynecologist and that was placed.  As far as breast cancer follow-up is concerned all Shavonte will need is her yearly screening mammography and a yearly physician breast exam.  I will be glad to see her again at any point in the future if and when the need arises but as of now are making her no further routine appointments with me here  Total encounter time 20 minutes.*   Brixon Zhen, Jordan Dad, MD  02/07/21 6:31 PM Medical Oncology and Hematology [facility] [address] [place],  [address] Tel. [phone]    Fax. [phone]    I, Wilburn Mylar, am acting as scribe for Dr. Virgie Daug[facility]. Zahari Xiang.  I, Lurline Del MD, have reviewed the above documentation for accuracy and completeness, and I agree with the above.   *Total Encounter Time as defined by the Centers for Medicare and Medicaid Services includes, in addition to the face-to-face time of a patient visit (documented in the note above) non-face-to-face time: obtaining and reviewing outside history, ordering and reviewing medications, tests or procedures, care coordination (communications with other health care professionals or caregivers) and documentation in the medical record.

## 2021-02-10 ENCOUNTER — Telehealth: Payer: Self-pay | Admitting: Adult Health

## 2021-02-10 NOTE — Telephone Encounter (Signed)
Sch per 5/3 los, patient aware

## 2021-03-13 ENCOUNTER — Telehealth: Payer: Self-pay

## 2021-03-13 ENCOUNTER — Telehealth (INDEPENDENT_AMBULATORY_CARE_PROVIDER_SITE_OTHER): Payer: 59 | Admitting: Cardiology

## 2021-03-13 ENCOUNTER — Encounter: Payer: Self-pay | Admitting: Cardiology

## 2021-03-13 VITALS — BP 97/63 | HR 79 | Ht 65.0 in | Wt 232.0 lb

## 2021-03-13 DIAGNOSIS — Z7189 Other specified counseling: Secondary | ICD-10-CM

## 2021-03-13 DIAGNOSIS — I428 Other cardiomyopathies: Secondary | ICD-10-CM

## 2021-03-13 DIAGNOSIS — E782 Mixed hyperlipidemia: Secondary | ICD-10-CM | POA: Diagnosis not present

## 2021-03-13 DIAGNOSIS — E669 Obesity, unspecified: Secondary | ICD-10-CM

## 2021-03-13 DIAGNOSIS — T451X5D Adverse effect of antineoplastic and immunosuppressive drugs, subsequent encounter: Secondary | ICD-10-CM

## 2021-03-13 DIAGNOSIS — E1169 Type 2 diabetes mellitus with other specified complication: Secondary | ICD-10-CM

## 2021-03-13 DIAGNOSIS — I427 Cardiomyopathy due to drug and external agent: Secondary | ICD-10-CM | POA: Diagnosis not present

## 2021-03-13 DIAGNOSIS — I5042 Chronic combined systolic (congestive) and diastolic (congestive) heart failure: Secondary | ICD-10-CM | POA: Diagnosis not present

## 2021-03-13 NOTE — Telephone Encounter (Signed)
  Patient Consent for Virtual Visit         [facility] has provided verbal consent on 03/13/2021 for a virtual visit (video or telephone).   CONSENT FOR VIRTUAL VISIT FOR:  [facility] D[facility]  By participating in this virtual visit I agree to the following:  I hereby voluntarily request, consent and authorize [facility] and its employed or contracted physicians, Engineer, materials, nurse practitioners or other licensed health care professionals (the Practitioner), to provide me with telemedicine health care services (the "Services") as deemed necessary by the treating Practitioner. I acknowledge and consent to receive the Services by the Practitioner via telemedicine. I understand that the telemedicine visit will involve communicating with the Practitioner through live audiovisual communication technology and the disclosure of certain medical information by electronic transmission. I acknowledge that I have been given the opportunity to request an in-person assessment or other available alternative prior to the telemedicine visit and am voluntarily participating in the telemedicine visit.  I understand that I have the right to withhold or withdraw my consent to the use of telemedicine in the course of my care at any time, without affecting my right to future care or treatment, and that the Practitioner or I may terminate the telemedicine visit at any time. I understand that I have the right to inspect all information obtained and/or recorded in the course of the telemedicine visit and may receive copies of available information for a reasonable fee.  I understand that some of the potential risks of receiving the Services via telemedicine include:  Marland Kitchen Delay or interruption in medical evaluation due to technological equipment failure or disruption; . Information transmitted may not be sufficient (e.g. poor resolution of images) to allow for appropriate medical decision making by the Practitioner;  and/or  . In rare instances, security protocols could fail, causing a breach of personal health information.  Furthermore, I acknowledge that it is my responsibility to provide information about my medical history, conditions and care that is complete and accurate to the best of my ability. I acknowledge that Practitioner's advice, recommendations, and/or decision may be based on factors not within their control, such as incomplete or inaccurate data provided by me or distortions of diagnostic images or specimens that may result from electronic transmissions. I understand that the practice of medicine is not an exact science and that Practitioner makes no warranties or guarantees regarding treatment outcomes. I acknowledge that a copy of this consent can be made available to me via my patient portal ([place]), or I can request a printed copy by calling the office of [facility].    I understand that my insurance will be billed for this visit.   I have read or had this consent read to me. . I understand the contents of this consent, which adequately explains the benefits and risks of the Services being provided via telemedicine.  . I have been provided ample opportunity to ask questions regarding this consent and the Services and have had my questions answered to my satisfaction. . I give my informed consent for the services to be provided through the use of telemedicine in my medical care

## 2021-03-13 NOTE — Patient Instructions (Signed)
Medication Instructions:  Your Physician recommend you continue on your current medication as directed.    *If you need a refill on your cardiac medications before your next appointment, please call your pharmacy*   Lab Work: None ordered today   Testing/Procedures: None ordered today   Follow-Up: At [facility], you and your health needs are our priority.  As part of our continuing mission to provide you with exceptional heart care, we have created designated Provider Care Teams.  These Care Teams include your primary Cardiologist (physician) and Advanced Practice Providers (APPs -  Physician Assistants and Nurse Practitioners) who all work together to provide you with the care you need, when you need it.  We recommend signing up for the patient portal called "MyChart".  Sign up information is provided on this After Visit Summary.  MyChart is used to connect with patients for Virtual Visits (Telemedicine).  Patients are able to view lab/test results, encounter notes, upcoming appointments, etc.  Non-urgent messages can be sent to your provider as well.   To learn more about what you can do with MyChart, go to [email or web address].    Your next appointment:   6 month(s) @ [address] [place], [place] [address]   The format for your next appointment:   In Person  Provider:   Bridgette Christopher, MD     

## 2021-03-13 NOTE — Progress Notes (Signed)
Virtual Visit via Video Note   This visit type was conducted due to national recommendations for restrictions regarding the COVID-19 Pandemic (e.g. social distancing) in an effort to limit this patient's exposure and mitigate transmission in our community.  Due to her co-morbid illnesses, this patient is at least at moderate risk for complications without adequate follow up.  This format is felt to be most appropriate for this patient at this time.  All issues noted in this document were discussed and addressed.  A limited physical exam was performed with this format.  Please refer to the patient's chart for her consent to telehealth for [facility].      The patient was identified using 2 identifiers.  Patient Location: Home Provider Location: Office/Clinic  History of Present Illness:    Jordan Daugherty is a 60 y.o. female with a hx of Nonischemic cardiomyopathy who is seen in follow up today. I initially saw her 08/05/19 as a new patient to me at the request of Wenda Low, MD for the evaluation and management of nonischemic cardiomyopathy. She was previously followed by Dr. Boyd Kerbs in [place].  Cardiac history: moved to [facility] in June 2020, had been living in [place] prior to this. She has a PMH of NICM 2/2 chemotherapy for breast cancer. Initially had fatigue, treated for bronchitis for 6 mos prior to diagnosis of heart failure. Has done well with watching salt,  Increasing activity, and CRT therapy. Has done really well on entresto but since moving to [place] her copay is $700/month. Started on jardiance, takes lasix PRN.  Today: BP is low but she feels fine. Usually runs 100s/60s.   Had an episode on her device, >2 hours, concerning for afib. She does not recall the date, discussed with device clinic and told not to worry about it. Due for another device interrogation soon.  Lost her brother and father earlier this year, other brother developed blindness. Has had a lot of  stress.  One episode of chest tightness, nonexertional, was in bed at the time, improved with moving positions. Occasionally lightheaded after taking medications. No swelling at all. Doesn't need lasix routinely. Able to walk daily without issue. No unexpected weight changes, no shortness of breath with basic activities. Doesn't feel limited when walking.  Past Medical History:  Diagnosis Date  . Breast cancer ([place]) 2007   left  . Chronic systolic dysfunction of left ventricle   . Dyslipidemia   . Family history of ovarian cancer   . Family history of prostate cancer   . Family history of rectal cancer   . Family history of uterine cancer   . Hypothyroidism   . Morbid obesity with BMI of 40.0-44.9, adult ([place])   . Nonischemic cardiomyopathy ([place])   . OSA on CPAP   . Personal history of radiation therapy 2007   left  . Type II diabetes mellitus ([place])     Past Surgical History:  Procedure Laterality Date  . BREAST BIOPSY Left 2007  . BREAST LUMPECTOMY Left 2007  . ICD IMPLANT  11/06/2016   [employer] Dynagen BiV ICD implanted for primary prevention    Current Medications: Current Outpatient Medications on File Prior to Visit  Medication Sig  . acetaminophen (TYLENOL) 500 MG tablet Take 500 mg by mouth every 6 (six) hours as needed.  Marland Kitchen aspirin EC 81 MG tablet Take 81 mg by mouth daily.  Marland Kitchen atorvastatin (LIPITOR) 40 MG tablet Take 40 mg by mouth daily.  . Calcium Acetate,  Phos Binder, (CALCIUM ACETATE PO) Take 400 mg by mouth daily.  . carvedilol (COREG) 25 MG tablet Take 25 mg by mouth 2 (two) times daily with a meal.  . cetirizine (ZYRTEC) 10 MG chewable tablet Chew 10 mg by mouth daily.  . fluticasone (VERAMYST) 27.5 MCG/SPRAY nasal spray Place 1 spray into the nose daily.  . furosemide (LASIX) 40 MG tablet TAKE 1 TABLET BY MOUTH DAILY AS NEEDED  . gabapentin (NEURONTIN) 300 MG capsule Take 300 mg by mouth 2 (two) times daily.  Marland Kitchen glucose blood (FREESTYLE LITE) test  strip 1 each by Other route as needed for other. Use as instructed  . JARDIANCE 25 MG TABS tablet Take 25 mg by mouth daily.  Marland Kitchen KLOR-CON M20 20 MEQ tablet TAKE 1 TABLET (20 MEQ TOTAL) BY MOUTH DAILY AS NEEDED (WHEN YOU TAKE LASIX).  Marland Kitchen levothyroxine (SYNTHROID) 75 MCG tablet Take 75 mcg by mouth daily before breakfast.  . metFORMIN (GLUCOPHAGE) 1000 MG tablet Take 1,000 mg by mouth 2 (two) times daily with a meal.  . Multiple Vitamin (MULTIVITAMIN) tablet Take 1 tablet by mouth daily.  . sacubitril-valsartan (ENTRESTO) 49-51 MG Take 1 tablet by mouth 2 (two) times daily.  Marland Kitchen spironolactone (ALDACTONE) 25 MG tablet Take 25 mg by mouth daily.   No current facility-administered medications on file prior to visit.     Allergies:   Pregabalin and Ramipril   Social History   Tobacco Use  . Smoking status: Never Smoker  . Smokeless tobacco: Never Used  Substance Use Topics  . Alcohol use: Not Currently    Family History: family history includes Dementia in her father; Diabetes in her paternal grandfather; Heart disease in her paternal grandfather; Ovarian cancer (age of onset: 50) in an other family member; Prostate cancer (age of onset: 50) in her father; Rectal cancer in an other family member; Skin cancer (age of onset: 52) in her brother; Uterine cancer (age of onset: 57) in her paternal grandmother.  ROS:   Please see the history of present illness.  Additional pertinent ROS otherwise unremarkable  EKGs/Labs/Other Studies Reviewed:    The following studies were reviewed today: Echo 07/18/20 1. Left ventricular ejection fraction, by estimation, is 40 to 45%. The  left ventricle has mildly decreased function. The left ventricle  demonstrates regional wall motion abnormalities. Global hypokinesis, more  pronounced at apex. There is mild left  ventricular hypertrophy. Left ventricular diastolic parameters are  consistent with Grade I diastolic dysfunction (impaired relaxation).  2.  Right ventricular systolic function is normal. The right ventricular  size is normal. Tricuspid regurgitation signal is inadequate for assessing  PA pressure.  3. The mitral valve is normal in structure. Trivial mitral valve  regurgitation.  4. The aortic valve is tricuspid. Aortic valve regurgitation is not  visualized. No aortic stenosis is present.  5. The inferior vena cava is normal in size with greater than 50%  respiratory variability, suggesting right atrial pressure of 3 mmHg.   Comparison(s): Prior studies done in [place], [place]. Prior EF 40-45% by  Report.  Echo 07/10/2018 (care everywhere) Study Conclusions  - Left ventricle: The cavity size is normal. Wall thickness is  normal. The estimated ejection fraction was in the range of 40%  to 45%. Doppler parameters are consistent with abnormal left  ventricular relaxation (grade 1 diastolic dysfunction). The  global longitudinal strain was -8.54%. - Right ventricle: Pacer wire noted in right ventricle. - Inferior vena cava: The vessel was normal in size;  the  respirophasic diameter changes were in the normal range (</= 50%);  findings are consistent with normal central venous pressure.  ------------------------------------------------------------------- Cardiac Anatomy  Left ventricle: - The cavity size is normal. Wall thickness is normal. The  estimated ejection fraction was in the range of 40% to 45%. The  global longitudinal strain was -8.54%. - Doppler parameters are consistent with abnormal left ventricular  relaxation (grade 1 diastolic dysfunction).  Aortic valve: - The valve was structurally normal. Cusp separation was normal. Doppler: - Transvalvular velocity is within the normal range. There is no  stenosis. There was no significant regurgitation. - The ratio of LVOT to aortic valve peak velocity is 0.52.  AORTA: Aortic root: The aortic root is not dilated.  Mitral valve: - The valve is  structurally normal. Leaflet separation was normal. Doppler: - Transvalvular velocity is within the normal range. There is no  evidence for stenosis. There was trivial regurgitation. - The mean diastolic gradient is 18mm Hg.  Left atrium: - The atrium is normal in size.  Right ventricle: - The cavity size is normal. Wall thickness is normal. Pacer wire  noted in right ventricle. Systolic function is normal.  Pulmonic valve: - The valve was structurally normal. Doppler: - There was trivial regurgitation.  Tricuspid valve: - The valve was structurally normal. Leaflet separation was normal. Doppler: - Transvalvular velocity is within the normal range. There is no  evidence for stenosis. There was trivial regurgitation.  Pulmonary artery: - Not well visualized.  Right atrium: - The atrium is normal in size.  Pericardium: - The pericardium was normal in appearance. There is no pericardial  effusion.  Systemic veins: Inferior vena cava: - The vessel was normal in size; the respirophasic diameter changes  were in the normal range (&gt;= 50%); findings are consistent with  normal central venous pressure. - The internal diameter is 1.2cm. - The proximal internal diameter during inspiration is 0.3cm.  EKG:  EKG is personally reviewed.  The ekg ordered 03/14/20 demonstrates a-sensed, biv-paced rhythm at 77 bpm  Recent Labs: 06/29/2020: BUN 16; Creatinine, Ser 0.83; Magnesium 1.7; Potassium 4.8; Sodium 139  Recent Lipid Panel No results found for: CHOL, TRIG, HDL, CHOLHDL, VLDL, LDLCALC, LDLDIRECT  Physical Exam:    VS:  BP 97/63   Pulse 79   Ht 5\' 5"  (1.651 m)   Wt 232 lb (105.2 kg)   BMI 38.61 kg/m     Wt Readings from Last 3 Encounters:  03/13/21 232 lb (105.2 kg)  02/07/21 238 lb (108 kg)  09/14/20 234 lb 6.4 oz (106.3 kg)    VITAL SIGNS:  reviewed GEN:  no acute distress EYES:  sclerae anicteric, EOMI - Extraocular Movements Intact RESPIRATORY:  normal  respiratory effort, symmetric expansion CARDIOVASCULAR:  no visible JVD SKIN:  no rash, lesions or ulcers. MUSCULOSKELETAL:  no obvious deformities. NEURO:  alert and oriented x 3, no obvious focal deficit PSYCH:  normal affect  ASSESSMENT:    1. Nonischemic cardiomyopathy ([place])   2. Chronic combined systolic and diastolic heart failure ([place])   3. 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 Mixed hyperlipidemia   5. Diabetes mellitus type 2 in obese ([facility])   6. Cardiac risk counseling    PLAN:    Nonischemic cardiomyopathy: likely 2/2 anthracycline treatment for breast cancer -NYHA class I-II -s/p CRT, followed by Dr. Rayann Heman -Continue entresto 49-51 mg dose BID.  -tolerating carvedilol 25 mg BID -tolerating spironolactone 25 mg daily. Given low BP, and  that this is nonischemic, if BP remains low would drop this medication first. She feels fine at her current blood pressure but will contact me if this changes. -tolerating jardiance -lasix as needed only, use potassium if using lasix.  History of breast cancer, in remission, treated with lumpectomy, sentinel lymph node, adriamycin, cytoxan, taxol, radiation. Treated with tamoxifen x 5years, no longer on letrozole -follows with Dr. Jana Hakim for monitoring  Mixed hyperlipidemia: -continue atorvastatin 40 mg daily -lipids per [facility] 07/28/20: Tchol 126, HDL 33, LDL 66, TG 158  OSA: -continue CPAP  Type II diabetes -continue jardiance and metformin -continue aspirin  Cardiac risk counseling and prevention recommendations: -recommend heart healthy/Mediterranean diet, with whole grains, fruits, vegetable, fish, lean meats, nuts, and olive oil. Limit salt. -recommend moderate walking, 3-5 times/week for 30-50 minutes each session. Aim for at least 150 minutes.week. Goal should be pace of 3 miles/hours, or walking 1.5 miles in 30 minutes -recommend avoidance of tobacco products. Avoid excess alcohol.  Plan for follow up: 6 mos or  sooner PRN  Today, I have spent 14 minutes with the patient with telehealth technology discussing the above problems.  Additional time spent in chart review, documentation, and communication.  Medication Adjustments/Labs and Tests Ordered: Current medicines are reviewed at length with the patient today.  Concerns regarding medicines are outlined above.  No orders of the defined types were placed in this encounter.  No orders of the defined types were placed in this encounter.   Patient Instructions  Medication Instructions:  Your Physician recommend you continue on your current medication as directed.    *If you need a refill on your cardiac medications before your next appointment, please call your pharmacy*   Lab Work: None ordered today   Testing/Procedures: None ordered today   Follow-Up: At [facility], you and your health needs are our priority.  As part of our continuing mission to provide you with exceptional heart care, we have created designated Provider Care Teams.  These Care Teams include your primary Cardiologist (physician) and Advanced Practice Providers (APPs -  Physician Assistants and Nurse Practitioners) who all work together to provide you with the care you need, when you need it.  We recommend signing up for the patient portal called "MyChart".  Sign up information is provided on this After Visit Summary.  MyChart is used to connect with patients for Virtual Visits (Telemedicine).  Patients are able to view lab/test results, encounter notes, upcoming appointments, etc.  Non-urgent messages can be sent to your provider as well.   To learn more about what you can do with MyChart, go to [email or web address].    Your next appointment:   6 month(s) @ [address] [place], [place] [id number]   The format for your next appointment:   In Person  Provider:   Buford Dresser, MD      Signed, Buford Dresser, MD PhD 03/13/2021    [place]

## 2021-03-22 ENCOUNTER — Telehealth: Payer: Self-pay | Admitting: Internal Medicine

## 2021-03-22 NOTE — Telephone Encounter (Signed)
Patient had questions regarding two separate debt collector notices for an appt  dated 10/10/20. She does not have a bill for that date. She would like further clarification about these charges.

## 2021-03-31 ENCOUNTER — Encounter (HOSPITAL_BASED_OUTPATIENT_CLINIC_OR_DEPARTMENT_OTHER): Payer: Self-pay

## 2021-03-31 ENCOUNTER — Telehealth (HOSPITAL_BASED_OUTPATIENT_CLINIC_OR_DEPARTMENT_OTHER): Payer: Self-pay

## 2021-03-31 NOTE — Telephone Encounter (Signed)
Jordan Daugherty is a 60 y.o. female was called and contacted re: New pt Pre appt call to collect history information. -Allergy -Medication -Confirm pharmacy -OB history   Pt was available.Chart was updated. Pt was notified to arrive 15 min early and we will need a urine sample when she arrives. Pt verbalized understanding.

## 2021-04-05 ENCOUNTER — Other Ambulatory Visit: Payer: Self-pay

## 2021-04-05 ENCOUNTER — Encounter (HOSPITAL_BASED_OUTPATIENT_CLINIC_OR_DEPARTMENT_OTHER): Payer: Self-pay | Admitting: Obstetrics & Gynecology

## 2021-04-05 ENCOUNTER — Other Ambulatory Visit (HOSPITAL_COMMUNITY)
Admission: RE | Admit: 2021-04-05 | Discharge: 2021-04-05 | Disposition: A | Discharge status: Home / Self Care | Payer: 59 | Source: Ambulatory Visit | Attending: Obstetrics & Gynecology | Admitting: Obstetrics & Gynecology

## 2021-04-05 ENCOUNTER — Ambulatory Visit (HOSPITAL_BASED_OUTPATIENT_CLINIC_OR_DEPARTMENT_OTHER): Payer: 59 | Admitting: Obstetrics & Gynecology

## 2021-04-05 VITALS — BP 94/67 | HR 75 | Ht 65.0 in | Wt 240.6 lb

## 2021-04-05 DIAGNOSIS — Z124 Encounter for screening for malignant neoplasm of cervix: Secondary | ICD-10-CM | POA: Insufficient documentation

## 2021-04-05 DIAGNOSIS — Z01419 Encounter for gynecological examination (general) (routine) without abnormal findings: Secondary | ICD-10-CM | POA: Diagnosis not present

## 2021-04-05 DIAGNOSIS — Z90721 Acquired absence of ovaries, unilateral: Secondary | ICD-10-CM | POA: Diagnosis not present

## 2021-04-05 DIAGNOSIS — E1169 Type 2 diabetes mellitus with other specified complication: Secondary | ICD-10-CM

## 2021-04-05 DIAGNOSIS — Z170 Estrogen receptor positive status [ER+]: Secondary | ICD-10-CM

## 2021-04-05 DIAGNOSIS — C50412 Malignant neoplasm of upper-outer quadrant of left female breast: Secondary | ICD-10-CM

## 2021-04-05 DIAGNOSIS — E669 Obesity, unspecified: Secondary | ICD-10-CM

## 2021-04-05 NOTE — Progress Notes (Signed)
60 y.o. [id number] Married Caucasian female here for new patient exam.  H/o breast cancer in 2007.   Was not living here at that time.  Was treated with lumpectomy with lymph node removal (1 positive).  Was treated with chemo and radiation.  Was on Tamoxifen and then letrozole.    Developed cardiomyopathy likely from chemotherapy.  Followed now by Dr. Harrell Gave.  No LMP recorded. Patient is postmenopausal.          Sexually active: Yes.    The current method of family planning is post menopausal status.    Exercising: Yes.     Three times weekly walking Smoker:  no  Health Maintenance: Pap:  will update today History of abnormal Pap:  no MMG:  12/2020 Colonoscopy:  due in 2023 BMD:   08/2017 TDaP:  2015 Pneumonia vaccine(s):  completed Shingrix:   completed Hep C testing: 08/2014 Screening Labs: 11/2019   reports that she has never smoked. She has never used smokeless tobacco. She reports previous alcohol use. She reports that she does not use drugs.  Past Medical History:  Diagnosis Date   Breast cancer ([place]) 2007   left   Chronic systolic dysfunction of left ventricle    Dyslipidemia    Family history of ovarian cancer    Family history of prostate cancer    Family history of rectal cancer    Family history of uterine cancer    Hypothyroidism    Morbid obesity with BMI of 40.0-44.9, adult ([place])    Nonischemic cardiomyopathy ([place])    OSA on CPAP    Personal history of radiation therapy 2007   left   Type II diabetes mellitus ([place])     Past Surgical History:  Procedure Laterality Date   BREAST BIOPSY Left 2007   BREAST LUMPECTOMY Left 2007   ICD IMPLANT  11/06/2016   [employer] Dynagen BiV ICD implanted for primary prevention    Current Outpatient Medications  Medication Sig Dispense Refill   acetaminophen (TYLENOL) 500 MG tablet Take 500 mg by mouth every 6 (six) hours as needed.     aspirin EC 81 MG tablet Take 81 mg by mouth daily.     atorvastatin  (LIPITOR) 40 MG tablet Take 40 mg by mouth daily.     Calcium Acetate, Phos Binder, (CALCIUM ACETATE PO) Take 400 mg by mouth daily.     carvedilol (COREG) 25 MG tablet Take 25 mg by mouth 2 (two) times daily with a meal.     cetirizine (ZYRTEC) 10 MG chewable tablet Chew 10 mg by mouth daily.     fluticasone (VERAMYST) 27.5 MCG/SPRAY nasal spray Place 1 spray into the nose daily.     furosemide (LASIX) 40 MG tablet TAKE 1 TABLET BY MOUTH DAILY AS NEEDED 90 tablet 0   gabapentin (NEURONTIN) 300 MG capsule Take 300 mg by mouth 2 (two) times daily.     glucose blood (FREESTYLE LITE) test strip 1 each by Other route as needed for other. Use as instructed     JARDIANCE 25 MG TABS tablet Take 25 mg by mouth daily.     KLOR-CON M20 20 MEQ tablet TAKE 1 TABLET (20 MEQ TOTAL) BY MOUTH DAILY AS NEEDED (WHEN YOU TAKE LASIX). 90 tablet 0   levothyroxine (SYNTHROID) 75 MCG tablet Take 75 mcg by mouth daily before breakfast.     metFORMIN (GLUCOPHAGE) 1000 MG tablet Take 1,000 mg by mouth 2 (two) times daily with a meal.  Multiple Vitamin (MULTIVITAMIN) tablet Take 1 tablet by mouth daily.     sacubitril-valsartan (ENTRESTO) 49-51 MG Take 1 tablet by mouth 2 (two) times daily.     spironolactone (ALDACTONE) 25 MG tablet Take 25 mg by mouth daily.     No current facility-administered medications for this visit.    Family History  Problem Relation Age of Onset   Prostate cancer Father 81   Dementia Father    Colon cancer Father    Skin cancer Brother 39   Uterine cancer Paternal Grandmother 34   Diabetes Paternal Grandfather    Heart disease Paternal Grandfather    Ovarian cancer Other 66       MGMs sister   Rectal cancer Other        MGMs mother    Review of Systems  Constitutional: Negative.   Gastrointestinal: Negative.   Genitourinary: Negative.    Exam:   BP 94/67 (BP Location: Right Arm, Patient Position: Sitting, Cuff Size: Large)   Pulse 75   Ht 5\' 5"  (1.651 m) Comment: reported   Wt 240 lb 9.6 oz (109.1 kg)   BMI 40.04 kg/m   Height: 5\' 5"  (165.1 cm) (reported)  General appearance: alert, cooperative and appears stated age Head: Normocephalic, without obvious abnormality, atraumatic Neck: no adenopathy, supple, symmetrical, trachea midline and thyroid normal to inspection and palpation Lungs: clear to auscultation bilaterally Breasts: normal appearance, no masses or tenderness, well healed scars noted Heart: regular rate and rhythm Abdomen: soft, non-tender; bowel sounds normal; no masses,  no organomegaly Extremities: extremities normal, atraumatic, no cyanosis or edema Skin: Skin color, texture, turgor normal. No rashes or lesions Lymph nodes: Cervical, supraclavicular, and axillary nodes normal. No abnormal inguinal nodes palpated Neurologic: Grossly normal   Pelvic: External genitalia:  no lesions              Urethra:  normal appearing urethra with no masses, tenderness or lesions              Bartholins and Skenes: normal                 Vagina: normal appearing vagina with normal color and no discharge, no lesions              Cervix: no lesions              Pap taken: Yes.   Bimanual Exam:  Uterus:  normal size, contour, position, consistency, mobility, non-tender              Adnexa: normal adnexa and no mass, fullness, tenderness               Rectovaginal: Confirms               Anus:  normal sphincter tone, no lesions  Chaperone, Octaviano Batty, CMA, was present for exam.  Assessment/Plan: 1. Well woman exam with routine gynecological exam - pap and HR HPV obtained today - last MMG 12/2020 - Colonoscopy due in 2023.  Pt aware. - BMD 11/208.  Plan to repeat next year - vaccines updated - lab work done with Dr. Edwena Bunde  2. Cervical cancer screening - Cytology - PAP( [place])  3. Diabetes mellitus type 2 in obese [facility]) - Ambulatory referral to Endocrinology  4. History of oophorectomy, unilateral  5. Malignant neoplasm of upper-outer  quadrant of left breast in female, estrogen receptor positive ([place])

## 2021-04-07 LAB — CYTOLOGY - PAP
Comment: NEGATIVE
Diagnosis: NEGATIVE
High risk HPV: NEGATIVE

## 2021-04-11 ENCOUNTER — Ambulatory Visit (INDEPENDENT_AMBULATORY_CARE_PROVIDER_SITE_OTHER): Payer: 59

## 2021-04-11 DIAGNOSIS — I427 Cardiomyopathy due to drug and external agent: Secondary | ICD-10-CM | POA: Diagnosis not present

## 2021-04-11 DIAGNOSIS — T451X5D Adverse effect of antineoplastic and immunosuppressive drugs, subsequent encounter: Secondary | ICD-10-CM

## 2021-04-11 DIAGNOSIS — T451X5A Adverse effect of antineoplastic and immunosuppressive drugs, initial encounter: Secondary | ICD-10-CM

## 2021-04-13 LAB — CUP PACEART REMOTE DEVICE CHECK
Battery Remaining Longevity: 96 mo
Battery Remaining Percentage: 100 %
Brady Statistic RA Percent Paced: 0 %
Brady Statistic RV Percent Paced: 99 %
Date Time Interrogation Session: 20220706120500
HighPow Impedance: 144 Ohm
Implantable Lead Implant Date: 20110301
Implantable Lead Implant Date: 20110301
Implantable Lead Implant Date: 20110301
Implantable Lead Location: 753858
Implantable Lead Location: 753859
Implantable Lead Location: 753860
Implantable Lead Model: 185
Implantable Lead Model: 350
Implantable Lead Model: 4592
Implantable Lead Serial Number: 131585
Implantable Lead Serial Number: 28731977
Implantable Lead Serial Number: 327266
Implantable Pulse Generator Implant Date: 20180130
Lead Channel Impedance Value: 601 Ohm
Lead Channel Impedance Value: 638 Ohm
Lead Channel Impedance Value: 678 Ohm
Lead Channel Pacing Threshold Amplitude: 0.6 V
Lead Channel Pacing Threshold Amplitude: 0.6 V
Lead Channel Pacing Threshold Amplitude: 1 V
Lead Channel Pacing Threshold Pulse Width: 0.4 ms
Lead Channel Pacing Threshold Pulse Width: 0.4 ms
Lead Channel Pacing Threshold Pulse Width: 0.4 ms
Lead Channel Setting Pacing Amplitude: 1.1 V
Lead Channel Setting Pacing Amplitude: 2 V
Lead Channel Setting Pacing Amplitude: 2 V
Lead Channel Setting Pacing Pulse Width: 0.4 ms
Lead Channel Setting Pacing Pulse Width: 0.4 ms
Lead Channel Setting Sensing Sensitivity: 0.6 mV
Lead Channel Setting Sensing Sensitivity: 1 mV
Pulse Gen Serial Number: 123793

## 2021-05-01 NOTE — Progress Notes (Signed)
Remote ICD transmission.   

## 2021-07-11 ENCOUNTER — Ambulatory Visit (INDEPENDENT_AMBULATORY_CARE_PROVIDER_SITE_OTHER): Payer: 59

## 2021-07-11 DIAGNOSIS — I427 Cardiomyopathy due to drug and external agent: Secondary | ICD-10-CM

## 2021-07-11 DIAGNOSIS — T451X5D Adverse effect of antineoplastic and immunosuppressive drugs, subsequent encounter: Secondary | ICD-10-CM

## 2021-07-11 DIAGNOSIS — T451X5A Adverse effect of antineoplastic and immunosuppressive drugs, initial encounter: Secondary | ICD-10-CM

## 2021-07-12 LAB — CUP PACEART REMOTE DEVICE CHECK
Battery Remaining Longevity: 96 mo
Battery Remaining Percentage: 100 %
Brady Statistic RA Percent Paced: 0 %
Brady Statistic RV Percent Paced: 99 %
Date Time Interrogation Session: 20221004134600
HighPow Impedance: 145 Ohm
Implantable Lead Implant Date: 20110301
Implantable Lead Implant Date: 20110301
Implantable Lead Implant Date: 20110301
Implantable Lead Location: 753858
Implantable Lead Location: 753859
Implantable Lead Location: 753860
Implantable Lead Model: 185
Implantable Lead Model: 350
Implantable Lead Model: 4592
Implantable Lead Serial Number: 131585
Implantable Lead Serial Number: 28731977
Implantable Lead Serial Number: 327266
Implantable Pulse Generator Implant Date: 20180130
Lead Channel Impedance Value: 625 Ohm
Lead Channel Impedance Value: 647 Ohm
Lead Channel Impedance Value: 743 Ohm
Lead Channel Pacing Threshold Amplitude: 0.6 V
Lead Channel Pacing Threshold Amplitude: 0.6 V
Lead Channel Pacing Threshold Amplitude: 1 V
Lead Channel Pacing Threshold Pulse Width: 0.4 ms
Lead Channel Pacing Threshold Pulse Width: 0.4 ms
Lead Channel Pacing Threshold Pulse Width: 0.4 ms
Lead Channel Setting Pacing Amplitude: 1.1 V
Lead Channel Setting Pacing Amplitude: 2 V
Lead Channel Setting Pacing Amplitude: 2 V
Lead Channel Setting Pacing Pulse Width: 0.4 ms
Lead Channel Setting Pacing Pulse Width: 0.4 ms
Lead Channel Setting Sensing Sensitivity: 0.6 mV
Lead Channel Setting Sensing Sensitivity: 1 mV
Pulse Gen Serial Number: 123793

## 2021-07-19 NOTE — Progress Notes (Signed)
Remote ICD transmission.   

## 2021-07-20 ENCOUNTER — Ambulatory Visit: Payer: 59 | Admitting: Internal Medicine

## 2021-08-09 ENCOUNTER — Ambulatory Visit: Payer: 59 | Admitting: Internal Medicine

## 2021-08-09 ENCOUNTER — Other Ambulatory Visit: Payer: Self-pay

## 2021-08-09 VITALS — BP 102/66 | HR 77 | Ht 65.0 in | Wt 240.6 lb

## 2021-08-09 DIAGNOSIS — E1165 Type 2 diabetes mellitus with hyperglycemia: Secondary | ICD-10-CM | POA: Diagnosis not present

## 2021-08-09 DIAGNOSIS — E1169 Type 2 diabetes mellitus with other specified complication: Secondary | ICD-10-CM

## 2021-08-09 DIAGNOSIS — E669 Obesity, unspecified: Secondary | ICD-10-CM | POA: Diagnosis not present

## 2021-08-09 LAB — GLUCOSE, POCT (MANUAL RESULT ENTRY): POC Glucose: 152 mg/dl — AB (ref 70–99)

## 2021-08-09 NOTE — Patient Instructions (Addendum)
Continue Metformin 1000 mg Twice a day  Continue Jardiance 25 mg daily      Brisk walking ~150 minutes a week   HOW TO TREAT LOW BLOOD SUGARS (Blood sugar LESS THAN 70 MG/DL) Please follow the RULE OF 15 for the treatment of hypoglycemia treatment (when your (blood sugars are less than 70 mg/dL)   STEP 1: Take 15 grams of carbohydrates when your blood sugar is low, which includes:  3-4 GLUCOSE TABS  OR 3-4 OZ OF JUICE OR REGULAR SODA OR ONE TUBE OF GLUCOSE GEL    STEP 2: RECHECK blood sugar in 15 MINUTES STEP 3: If your blood sugar is still low at the 15 minute recheck --> then, go back to STEP 1 and treat AGAIN with another 15 grams of carbohydrates.

## 2021-08-09 NOTE — Progress Notes (Signed)
Name: Jordan Daugherty  MRN/ DOB: [id number], [birth year]   Age/ Sex: 60 y.o., female    PCP: Wenda Low, MD   Reason for Endocrinology Evaluation: Type 2 Diabetes Mellitus     Date of Initial Endocrinology Visit: 08/09/2021     PATIENT IDENTIFIER: Ms. Jordan Daugherty is a 60 y.o. female with a past medical history of T2DM, HTN, hypothyroid, OSA on CPAP, history of breast CA (Dx 2007, status postlumpectomy chemo and radiation), CHF, VT (status post ICD). The patient presented for initial endocrinology clinic visit on 08/09/2021 for consultative assistance with her diabetes management.    HPI: Ms. Petre was referred here by Gynecology. She was to see an endocrinologist in [place] prior to moving here 3 years ago. Her diabetes has been managed by PCP since her move to [facility] .   Diagnosed with DM 2016 Prior Medications tried/Intolerance: as listed  Currently checking blood sugars 0 x / day,  Hypoglycemia episodes : no        Hemoglobin A1c has ranged from 7.1% , peaking at 7.9% in 2020. Patient required assistance for hypoglycemia: no Patient has required hospitalization within the last 1 year from hyper or hypoglycemia:   In terms of diet, the patient eats 3 meals a day, does not snack   Used to follow-up with endocrinology in [place] in 2020   No children ,has had still birth and miscarriages   NO Fh of immune disease    THYROID HISTORY: She has been diagnosed with hypothyroid in 2010.    HOME ENDOCRINE REGIMEN: Metformin 1000 mg BID  Jardiance 25 mg daily  Levothyroxine 75 MCG daily  Statin: Yes ACE-I/ARB: yes    METER DOWNLOAD SUMMARY: Did not bring    DIABETIC COMPLICATIONS: Microvascular complications:  Chemo induced neuropathy  Denies: CKD Last eye exam: scheduled 08/24/2021  Macrovascular complications:  CHF Denies: CAD, PVD, CVA   PAST HISTORY: Past Medical History:  Past Medical History:  Diagnosis Date   Breast cancer ([place]) 2007   left   Chronic  systolic dysfunction of left ventricle    Dyslipidemia    Family history of ovarian cancer    Family history of prostate cancer    Family history of rectal cancer    Family history of uterine cancer    Hypothyroidism    Morbid obesity with BMI of 40.0-44.9, adult ([place])    Nonischemic cardiomyopathy ([place])    OSA on CPAP    Personal history of radiation therapy 2007   left   Type II diabetes mellitus ([place])    Past Surgical History:  Past Surgical History:  Procedure Laterality Date   BREAST BIOPSY Left 2007   BREAST LUMPECTOMY Left 2007   ICD IMPLANT  11/06/2016   [employer] Dynagen BiV ICD implanted for primary prevention   OOPHORECTOMY Right 05/2005   dermoid    Social History:  reports that she has never smoked. She has never used smokeless tobacco. She reports that she does not currently use alcohol. She reports that she does not use drugs. Family History:  Family History  Problem Relation Age of Onset   Prostate cancer Father 6   Dementia Father    Colon cancer Father    Skin cancer Brother 51   Uterine cancer Paternal Grandmother 11   Diabetes Paternal Grandfather    Heart disease Paternal Grandfather    Ovarian cancer Other 46       MGMs sister   Rectal cancer Other  MGMs mother     HOME MEDICATIONS: Allergies as of 08/09/2021       Reactions   Pregabalin    MAKES HER MEAN   Ramipril Other (See Comments)   cough        Medication List        Accurate as of August 09, 2021  2:52 PM. If you have any questions, ask your nurse or doctor.          STOP taking these medications    cetirizine 10 MG chewable tablet Commonly known as: ZYRTEC Stopped by: Dorita Sciara, MD   fluticasone 27.5 MCG/SPRAY nasal spray Commonly known as: VERAMYST Stopped by: Dorita Sciara, MD       TAKE these medications    acetaminophen 500 MG tablet Commonly known as: TYLENOL Take 500 mg by mouth every 6 (six) hours as needed.    aspirin EC 81 MG tablet Take 81 mg by mouth daily.   atorvastatin 40 MG tablet Commonly known as: LIPITOR Take 40 mg by mouth daily.   CALCIUM ACETATE PO Take 400 mg by mouth daily.   carvedilol 25 MG tablet Commonly known as: COREG Take 25 mg by mouth 2 (two) times daily with a meal.   Entresto 49-51 MG Generic drug: sacubitril-valsartan Take 1 tablet by mouth 2 (two) times daily.   FREESTYLE LITE test strip Generic drug: glucose blood 1 each by Other route as needed for other. Use as instructed   furosemide 40 MG tablet Commonly known as: LASIX TAKE 1 TABLET BY MOUTH DAILY AS NEEDED   gabapentin 300 MG capsule Commonly known as: NEURONTIN Take 300 mg by mouth 2 (two) times daily.   Jardiance 25 MG Tabs tablet Generic drug: empagliflozin Take 25 mg by mouth daily.   Klor-Con M20 20 MEQ tablet Generic drug: potassium chloride SA TAKE 1 TABLET (20 MEQ TOTAL) BY MOUTH DAILY AS NEEDED (WHEN YOU TAKE LASIX).   levothyroxine 75 MCG tablet Commonly known as: SYNTHROID Take 75 mcg by mouth daily before breakfast.   metFORMIN 1000 MG tablet Commonly known as: GLUCOPHAGE Take 1,000 mg by mouth 2 (two) times daily with a meal.   multivitamin tablet Take 1 tablet by mouth daily.   spironolactone 25 MG tablet Commonly known as: ALDACTONE Take 25 mg by mouth daily.         ALLERGIES: Allergies  Allergen Reactions   Pregabalin     MAKES HER MEAN   Ramipril Other (See Comments)    cough     REVIEW OF SYSTEMS: A comprehensive ROS was conducted with the patient and is negative except as per HPI and below:  ROS    OBJECTIVE:   VITAL SIGNS: BP 102/66 (BP Location: Right Arm, Patient Position: Sitting, Cuff Size: Normal)   Pulse 77   Ht 5\' 5"  (1.651 m)   Wt 240 lb 9.6 oz (109.1 kg)   SpO2 94%   BMI 40.04 kg/m    PHYSICAL EXAM:  General: Pt appears well and is in NAD  Neck: General: Supple without adenopathy or carotid bruits. Thyroid: Thyroid size  normal.  No goiter or nodules appreciated  Lungs: Clear with good BS bilat with no rales, rhonchi, or wheezes  Heart: RRR with normal S1 and S2 and no gallops; no murmurs; no rub  Abdomen: Normoactive bowel sounds, soft, nontender, without masses or organomegaly palpable  Extremities:  Lower extremities - No pretibial edema. No lesions.  Skin: Normal texture and temperature to palpation.  No rash noted. No Acanthosis nigricans/skin tags. No lipohypertrophy.  Neuro: MS is good with appropriate affect, pt is alert and Ox3    DM foot exam: 08/09/2021  The skin of the feet is intact without sores or ulcerations. The pedal pulses are 2+ on right and 2+ on left. The sensation is intact to a screening 5.07, 10 gram monofilament bilaterally   DATA REVIEWED:  No results found for: HGBA1C Lab Results  Component Value Date   CREATININE 0.83 06/29/2020     ASSESSMENT / PLAN / RECOMMENDATIONS:   1) Type 2 Diabetes Mellitus, Sub-Optimally controlled, With Neuropathic  complications - Most recent A1c of 7.3 %. Goal A1c < 7.0 %.     - A1c slightly above goal  - pt admits there's room for improvement, we discussed exercise ~ 150 minutes per week  - We also discussed reducing the amount of CHO if possible.  - I have encouraged her  to check glucose more frequent - She does note fasting hyperglycemia, pt advised to figure this out is to beck BG at bedtime and fasting, 30 point difference if acceptable - We reviewed her glycemic agents, she is on appropriate treatment with no side effects  - With an A1c of 7.3 % , she will focus on lifestyle changes, she is not interested in adding any more glycemic agents at this time   MEDICATIONS: Continue Metformin 1000 mg BID  Continue Jardiance 25 mg daily   EDUCATION / INSTRUCTIONS: BG monitoring instructions: Patient is instructed to check her blood sugars 3 times a week.    2) Diabetic complications:  Eye: Does not have known diabetic retinopathy.   Neuro/ Feet: Does not have known chemo-induced  peripheral neuropathy. Renal: Patient does not have known baseline CKD. She is  on an ACEI/ARB at present.   3) Hypothyroidism:   - Her recent TSh is at goal  - Pt educated extensively on the correct way to take levothyroxine (first thing in the morning with water, 30 minutes before eating or taking other medications). - Pt encouraged to double dose the following day if she were to miss a dose given long half-life of levothyroxine.  Medication  Levothyroxine 75 mcg daily   Recommend continuation of current Tx with primary MD and consultative f/u at [place] clinic in the future if pt's DM control becomes problematic.    Signed electronically by: Mack Guise, MD  [facility] Endocrinology  [facility] [address] [place], [place] [address] Phone: [phone] FAX: [phone]   CC: Wenda Low, MD [facility] [employer] [place] [address] [place] [address] Phone: [phone]  Fax: [phone]    Return to Endocrinology clinic as below: Future Appointments  Date Time Provider [place]  10/10/2021  7:00 AM [facility] DEVICE REMOTES [facility]  01/09/2022  7:00 AM [facility] DEVICE REMOTES [facility]  02/09/2022  1:00 PM Causey, Charlestine Massed, NP [facility] None  04/27/2022  9:30 AM Megan Salon, MD [facility]

## 2021-09-25 NOTE — Progress Notes (Signed)
Electrophysiology Office Note Date: 09/26/2021  ID:  Jordan Daugherty, DOB November 13, 1960, MRN [id number]  PCP: Wenda Low, MD Primary Cardiologist: Buford Dresser, MD Electrophysiologist: Thompson Grayer, MD   CC: Routine ICD follow-up  Jordan Daugherty is a 60 y.o. female seen today for Thompson Grayer, MD for routine electrophysiology followup.  Since last being seen in our clinic the patient reports doing very well.  she denies chest pain, palpitations, dyspnea, PND, orthopnea, nausea, vomiting, dizziness, syncope, edema, weight gain, or early satiety. She has not had ICD shocks.   Device History: [employer] BiV ICD implanted 2011, gen change 2018 for CHF   Past Medical History:  Diagnosis Date   Breast cancer ([place]) 2007   left   Chronic systolic dysfunction of left ventricle    Dyslipidemia    Family history of ovarian cancer    Family history of prostate cancer    Family history of rectal cancer    Family history of uterine cancer    Hypothyroidism    Morbid obesity with BMI of 40.0-44.9, adult ([place])    Nonischemic cardiomyopathy ([place])    OSA on CPAP    Personal history of radiation therapy 2007   left   Type II diabetes mellitus ([place])    Past Surgical History:  Procedure Laterality Date   BREAST BIOPSY Left 2007   BREAST LUMPECTOMY Left 2007   ICD IMPLANT  11/06/2016   [employer] Dynagen BiV ICD implanted for primary prevention   OOPHORECTOMY Right 05/2005   dermoid    Current Outpatient Medications  Medication Sig Dispense Refill   acetaminophen (TYLENOL) 500 MG tablet Take 500 mg by mouth every 6 (six) hours as needed.     aspirin EC 81 MG tablet Take 81 mg by mouth daily.     atorvastatin (LIPITOR) 40 MG tablet Take 40 mg by mouth daily.     Calcium Acetate, Phos Binder, (CALCIUM ACETATE PO) Take 400 mg by mouth daily.     carvedilol (COREG) 25 MG tablet Take 25 mg by mouth 2 (two) times daily with a meal.     furosemide (LASIX) 40 MG  tablet TAKE 1 TABLET BY MOUTH DAILY AS NEEDED 90 tablet 0   gabapentin (NEURONTIN) 300 MG capsule Take 300 mg by mouth 2 (two) times daily.     glucose blood (FREESTYLE LITE) test strip 1 each by Other route as needed for other. Use as instructed     JARDIANCE 25 MG TABS tablet Take 25 mg by mouth daily.     KLOR-CON M20 20 MEQ tablet TAKE 1 TABLET (20 MEQ TOTAL) BY MOUTH DAILY AS NEEDED (WHEN YOU TAKE LASIX). 90 tablet 0   levothyroxine (SYNTHROID) 75 MCG tablet Take 75 mcg by mouth daily before breakfast.     metFORMIN (GLUCOPHAGE) 1000 MG tablet Take 1,000 mg by mouth 2 (two) times daily with a meal.     Multiple Vitamin (MULTIVITAMIN) tablet Take 1 tablet by mouth daily.     sacubitril-valsartan (ENTRESTO) 49-51 MG Take 1 tablet by mouth 2 (two) times daily.     spironolactone (ALDACTONE) 25 MG tablet Take 25 mg by mouth daily.     No current facility-administered medications for this visit.    Allergies:   Pregabalin and Ramipril   Social History: Social History   Socioeconomic History   Marital status: Married    Spouse name: Not on file   Number of children: Not on file   Years of education:  Not on file   Highest education level: Not on file  Occupational History   Not on file  Tobacco Use   Smoking status: Never   Smokeless tobacco: Never  Vaping Use   Vaping Use: Not on file  Substance and Sexual Activity   Alcohol use: Not Currently   Drug use: Never   Sexual activity: Yes    Birth control/protection: None  Other Topics Concern   Not on file  Social History Narrative   Lives in [place] from [place] Strain: Not on file  Food Insecurity: Not on file  Transportation Needs: Not on file  Physical Activity: Not on file  Stress: Not on file  Social Connections: Not on file  Intimate Partner Violence: Not on file    Family History: Family History  Problem Relation Age of Onset   Prostate cancer  Father 63   Dementia Father    Colon cancer Father    Skin cancer Brother 67   Uterine cancer Paternal Grandmother 45   Diabetes Paternal Grandfather    Heart disease Paternal Grandfather    Ovarian cancer Other 21       MGMs sister   Rectal cancer Other        MGMs mother    Review of Systems: All other systems reviewed and are otherwise negative except as noted above.   Physical Exam: Vitals:   09/26/21 1144  BP: 100/68  Pulse: 80  SpO2: 95%  Weight: 239 lb (108.4 kg)  Height: 5\' 5"  (1.651 m)     GEN- The patient is well appearing, alert and oriented x 3 today.   HEENT: normocephalic, atraumatic; sclera clear, conjunctiva pink; hearing intact; oropharynx clear; neck supple, no JVP Lymph- no cervical lymphadenopathy Lungs- Clear to ausculation bilaterally, normal work of breathing.  No wheezes, rales, rhonchi Heart- Regular rate and rhythm, no murmurs, rubs or gallops, PMI not laterally displaced GI- soft, non-tender, non-distended, bowel sounds present, no hepatosplenomegaly Extremities- no clubbing or cyanosis. No edema; DP/PT/radial pulses 2+ bilaterally MS- no significant deformity or atrophy Skin- warm and dry, no rash or lesion; ICD pocket well healed Psych- euthymic mood, full affect Neuro- strength and sensation are intact  ICD interrogation- reviewed in detail today,  See PACEART report  EKG:  EKG is ordered today. Personal review of EKG ordered today shows BiV pacing at 80  Recent Labs: No results found for requested labs within last 8760 hours.   Wt Readings from Last 3 Encounters:  09/26/21 239 lb (108.4 kg)  08/09/21 240 lb 9.6 oz (109.1 kg)  04/05/21 240 lb 9.6 oz (109.1 kg)     Other studies Reviewed: Additional studies/ records that were reviewed today include: Previous EP office notes.   Assessment and Plan:  1.  Chronic systolic dysfunction s/p [employer] CRT-D  EF 40-45% 07/2020.  NYHA II symptoms currently euvolemic  today Stable on an appropriate medical regimen Normal ICD function See Pace Art report No changes today   Current medicines are reviewed at length with the patient today.    Labs/ tests ordered today include:  Orders Placed This Encounter  Procedures   Basic metabolic panel   CBC   EKG 12-Lead     Disposition:   Follow up with EP APP in 6 months    Signed, Shirley Friar, PA-C  09/26/2021 12:23 PM  [place]  [id number] [phone] (office) [phone] (fax)

## 2021-09-26 ENCOUNTER — Encounter: Payer: Self-pay | Admitting: Student

## 2021-09-26 ENCOUNTER — Ambulatory Visit: Payer: 59 | Admitting: Student

## 2021-09-26 ENCOUNTER — Other Ambulatory Visit: Payer: Self-pay

## 2021-09-26 VITALS — BP 100/68 | HR 80 | Ht 65.0 in | Wt 239.0 lb

## 2021-09-26 DIAGNOSIS — T451X5D Adverse effect of antineoplastic and immunosuppressive drugs, subsequent encounter: Secondary | ICD-10-CM

## 2021-09-26 DIAGNOSIS — I427 Cardiomyopathy due to drug and external agent: Secondary | ICD-10-CM

## 2021-09-26 DIAGNOSIS — I5042 Chronic combined systolic (congestive) and diastolic (congestive) heart failure: Secondary | ICD-10-CM | POA: Diagnosis not present

## 2021-09-26 NOTE — Patient Instructions (Signed)
Medication Instructions:  Your physician recommends that you continue on your current medications as directed. Please refer to the Current Medication list given to you today.  *If you need a refill on your cardiac medications before your next appointment, please call your pharmacy*   Lab Work: TODAY: BMET, CBC  If you have labs (blood work) drawn today and your tests are completely normal, you will receive your results only by: [place] (if you have MyChart) OR A paper copy in the mail If you have any lab test that is abnormal or we need to change your treatment, we will call you to review the results.   Follow-Up: At [facility], you and your health needs are our priority.  As part of our continuing mission to provide you with exceptional heart care, we have created designated Provider Care Teams.  These Care Teams include your primary Cardiologist (physician) and Advanced Practice Providers (APPs -  Physician Assistants and Nurse Practitioners) who all work together to provide you with the care you need, when you need it.  Your next appointment:   6 month(s)  The format for your next appointment:   In Person  Provider:   Legrand Como "Oda Kilts, PA-C

## 2021-09-27 LAB — CUP PACEART INCLINIC DEVICE CHECK
Date Time Interrogation Session: 20221220000000
HighPow Impedance: 154 Ohm
Implantable Lead Implant Date: 20110301
Implantable Lead Implant Date: 20110301
Implantable Lead Implant Date: 20110301
Implantable Lead Location: 753858
Implantable Lead Location: 753859
Implantable Lead Location: 753860
Implantable Lead Model: 185
Implantable Lead Model: 350
Implantable Lead Model: 4592
Implantable Lead Serial Number: 131585
Implantable Lead Serial Number: 28731977
Implantable Lead Serial Number: 327266
Implantable Pulse Generator Implant Date: 20180130
Lead Channel Impedance Value: 636 Ohm
Lead Channel Impedance Value: 638 Ohm
Lead Channel Impedance Value: 740 Ohm
Lead Channel Pacing Threshold Amplitude: 0.7 V
Lead Channel Pacing Threshold Amplitude: 0.7 V
Lead Channel Pacing Threshold Amplitude: 1.1 V
Lead Channel Pacing Threshold Pulse Width: 0.4 ms
Lead Channel Pacing Threshold Pulse Width: 0.4 ms
Lead Channel Pacing Threshold Pulse Width: 0.4 ms
Lead Channel Sensing Intrinsic Amplitude: 11.3 mV
Lead Channel Sensing Intrinsic Amplitude: 12.7 mV
Lead Channel Sensing Intrinsic Amplitude: 3.1 mV
Lead Channel Setting Pacing Amplitude: 1.1 V
Lead Channel Setting Pacing Amplitude: 2 V
Lead Channel Setting Pacing Amplitude: 2 V
Lead Channel Setting Pacing Pulse Width: 0.4 ms
Lead Channel Setting Pacing Pulse Width: 0.4 ms
Lead Channel Setting Sensing Sensitivity: 0.6 mV
Lead Channel Setting Sensing Sensitivity: 1 mV
Pulse Gen Serial Number: 123793

## 2021-09-27 LAB — CBC
Hematocrit: 42.3 % (ref 34.0–46.6)
Hemoglobin: 13.8 g/dL (ref 11.1–15.9)
MCH: 28.9 pg (ref 26.6–33.0)
MCHC: 32.6 g/dL (ref 31.5–35.7)
MCV: 89 fL (ref 79–97)
Platelets: 324 10*3/uL (ref 150–450)
RBC: 4.78 x10E6/uL (ref 3.77–5.28)
RDW: 12.3 % (ref 11.7–15.4)
WBC: 8.8 10*3/uL (ref 3.4–10.8)

## 2021-09-27 LAB — BASIC METABOLIC PANEL
BUN/Creatinine Ratio: 17 (ref 12–28)
BUN: 14 mg/dL (ref 8–27)
CO2: 22 mmol/L (ref 20–29)
Calcium: 9.8 mg/dL (ref 8.7–10.3)
Chloride: 102 mmol/L (ref 96–106)
Creatinine, Ser: 0.82 mg/dL (ref 0.57–1.00)
Glucose: 146 mg/dL — ABNORMAL HIGH (ref 70–99)
Potassium: 4.6 mmol/L (ref 3.5–5.2)
Sodium: 139 mmol/L (ref 134–144)
eGFR: 81 mL/min/{1.73_m2} (ref >59)

## 2021-10-10 ENCOUNTER — Ambulatory Visit (INDEPENDENT_AMBULATORY_CARE_PROVIDER_SITE_OTHER): Payer: 59

## 2021-10-10 DIAGNOSIS — T451X5A Adverse effect of antineoplastic and immunosuppressive drugs, initial encounter: Secondary | ICD-10-CM

## 2021-10-10 DIAGNOSIS — I427 Cardiomyopathy due to drug and external agent: Secondary | ICD-10-CM

## 2021-10-10 DIAGNOSIS — Z95810 Presence of automatic (implantable) cardiac defibrillator: Secondary | ICD-10-CM | POA: Diagnosis not present

## 2021-10-10 DIAGNOSIS — T451X5D Adverse effect of antineoplastic and immunosuppressive drugs, subsequent encounter: Secondary | ICD-10-CM | POA: Diagnosis not present

## 2021-10-10 LAB — CUP PACEART REMOTE DEVICE CHECK
Battery Remaining Longevity: 90 mo
Battery Remaining Percentage: 100 %
Brady Statistic RA Percent Paced: 0 %
Brady Statistic RV Percent Paced: 99 %
Date Time Interrogation Session: 20230103001100
HighPow Impedance: 141 Ohm
Implantable Lead Implant Date: 20110301
Implantable Lead Implant Date: 20110301
Implantable Lead Implant Date: 20110301
Implantable Lead Location: 753858
Implantable Lead Location: 753859
Implantable Lead Location: 753860
Implantable Lead Model: 185
Implantable Lead Model: 350
Implantable Lead Model: 4592
Implantable Lead Serial Number: 131585
Implantable Lead Serial Number: 28731977
Implantable Lead Serial Number: 327266
Implantable Pulse Generator Implant Date: 20180130
Lead Channel Impedance Value: 603 Ohm
Lead Channel Impedance Value: 633 Ohm
Lead Channel Impedance Value: 741 Ohm
Lead Channel Pacing Threshold Amplitude: 0.7 V
Lead Channel Pacing Threshold Amplitude: 0.7 V
Lead Channel Pacing Threshold Amplitude: 1.1 V
Lead Channel Pacing Threshold Pulse Width: 0.4 ms
Lead Channel Pacing Threshold Pulse Width: 0.4 ms
Lead Channel Pacing Threshold Pulse Width: 0.4 ms
Lead Channel Setting Pacing Amplitude: 1.1 V
Lead Channel Setting Pacing Amplitude: 2 V
Lead Channel Setting Pacing Amplitude: 2 V
Lead Channel Setting Pacing Pulse Width: 0.4 ms
Lead Channel Setting Pacing Pulse Width: 0.4 ms
Lead Channel Setting Sensing Sensitivity: 0.6 mV
Lead Channel Setting Sensing Sensitivity: 1 mV
Pulse Gen Serial Number: 123793

## 2021-10-19 NOTE — Progress Notes (Signed)
Remote ICD transmission.   

## 2021-10-20 ENCOUNTER — Other Ambulatory Visit: Payer: Self-pay

## 2021-10-20 ENCOUNTER — Ambulatory Visit (HOSPITAL_BASED_OUTPATIENT_CLINIC_OR_DEPARTMENT_OTHER): Payer: 59 | Admitting: Cardiology

## 2021-10-20 ENCOUNTER — Encounter (HOSPITAL_BASED_OUTPATIENT_CLINIC_OR_DEPARTMENT_OTHER): Payer: Self-pay | Admitting: Cardiology

## 2021-10-20 VITALS — BP 92/60 | HR 77 | Ht 65.0 in | Wt 238.4 lb

## 2021-10-20 DIAGNOSIS — E669 Obesity, unspecified: Secondary | ICD-10-CM

## 2021-10-20 DIAGNOSIS — I427 Cardiomyopathy due to drug and external agent: Secondary | ICD-10-CM

## 2021-10-20 DIAGNOSIS — E1169 Type 2 diabetes mellitus with other specified complication: Secondary | ICD-10-CM | POA: Diagnosis not present

## 2021-10-20 DIAGNOSIS — I428 Other cardiomyopathies: Secondary | ICD-10-CM

## 2021-10-20 DIAGNOSIS — T451X5D Adverse effect of antineoplastic and immunosuppressive drugs, subsequent encounter: Secondary | ICD-10-CM

## 2021-10-20 DIAGNOSIS — E782 Mixed hyperlipidemia: Secondary | ICD-10-CM | POA: Diagnosis not present

## 2021-10-20 DIAGNOSIS — Z6839 Body mass index (BMI) 39.0-39.9, adult: Secondary | ICD-10-CM

## 2021-10-20 NOTE — Patient Instructions (Signed)
Medication Instructions:  Your Physician recommend you continue on your current medication as directed.    If you are interested in GLP1-RA after researching, please let me know.  *If you need a refill on your cardiac medications before your next appointment, please call your pharmacy*   Lab Work: None ordered today   Testing/Procedures: None ordered today   Follow-Up: At [facility], you and your health needs are our priority.  As part of our continuing mission to provide you with exceptional heart care, we have created designated Provider Care Teams.  These Care Teams include your primary Cardiologist (physician) and Advanced Practice Providers (APPs -  Physician Assistants and Nurse Practitioners) who all work together to provide you with the care you need, when you need it.  We recommend signing up for the patient portal called "MyChart".  Sign up information is provided on this After Visit Summary.  MyChart is used to connect with patients for Virtual Visits (Telemedicine).  Patients are able to view lab/test results, encounter notes, upcoming appointments, etc.  Non-urgent messages can be sent to your provider as well.   To learn more about what you can do with MyChart, go to [email or web address].    Your next appointment:   1 year(s)  The format for your next appointment:   In Person  Provider:   Buford Dresser, MD    Other Instructions

## 2021-10-20 NOTE — Progress Notes (Signed)
Cardiology Office Note:    Date:  10/20/2021   ID:  [name], DOB [birth year], MRN [id number]  PCP:  Wenda Low, MD  Cardiologist:  Buford Dresser, MD  Referring MD: Wenda Low, MD   CC: follow up  History of Present Illness:    Jordan Daugherty is a 61 y.o. female with a hx of Nonischemic cardiomyopathy who is seen in follow up today. I initially saw her 08/05/19 as a new patient to me at the request of Wenda Low, MD for the evaluation and management of nonischemic cardiomyopathy. She was previously followed by Dr. Boyd Kerbs in [place].  Cardiac history: moved to [facility] in June 2020, had been living in [place] prior to this. She has a PMH of NICM 2/2 chemotherapy for breast cancer. Initially had fatigue, treated for bronchitis for 6 mos prior to diagnosis of heart failure. Has done well with watching salt,  Increasing activity, and CRT therapy. Has done really well on entresto but since moving to [place] her copay is $700/month. Started on jardiance, takes lasix PRN.  Today: Overall, she has been feeling well. Occasionally she feels some central chest discomfort, but she would not necessarily describe this as pain. She denies any issues with acid reflux.  At home her blood pressure has been stable. She denies any lightheadedness.  She has not been participating in much formal exercise, mostly due to a lack of motivation. Her blood sugars have increased a bit, but she attributes this to not walking for exercise.  She plans to lose some weight. Lately, she has started following the plant-based [place] diet.  Typically she does not need to take Lasix. Spironolactone is working for her.  She denies any palpitations, or shortness of breath. No headaches, syncope, orthopnea, PND, or exertional symptoms. No hematuria or hematochezia.  Past Medical History:  Diagnosis Date   Breast cancer ([place]) 2007   left   Chronic systolic dysfunction of left ventricle     Dyslipidemia    Family history of ovarian cancer    Family history of prostate cancer    Family history of rectal cancer    Family history of uterine cancer    Hypothyroidism    Morbid obesity with BMI of 40.0-44.9, adult ([place])    Nonischemic cardiomyopathy ([place])    OSA on CPAP    Personal history of radiation therapy 2007   left   Type II diabetes mellitus ([place])     Past Surgical History:  Procedure Laterality Date   BREAST BIOPSY Left 2007   BREAST LUMPECTOMY Left 2007   ICD IMPLANT  11/06/2016   [employer] Dynagen BiV ICD implanted for primary prevention   OOPHORECTOMY Right 05/2005   dermoid    Current Medications: Current Outpatient Medications on File Prior to Visit  Medication Sig   acetaminophen (TYLENOL) 500 MG tablet Take 500 mg by mouth every 6 (six) hours as needed.   aspirin EC 81 MG tablet Take 81 mg by mouth daily.   atorvastatin (LIPITOR) 40 MG tablet Take 40 mg by mouth daily.   Calcium Acetate, Phos Binder, (CALCIUM ACETATE PO) Take 400 mg by mouth daily.   carvedilol (COREG) 25 MG tablet Take 25 mg by mouth 2 (two) times daily with a meal.   furosemide (LASIX) 40 MG tablet TAKE 1 TABLET BY MOUTH DAILY AS NEEDED   gabapentin (NEURONTIN) 300 MG capsule Take 900 mg by mouth 2 (two) times daily.   glucose blood (FREESTYLE LITE) test strip 1  each by Other route as needed for other. Use as instructed   JARDIANCE 25 MG TABS tablet Take 25 mg by mouth daily.   KLOR-CON M20 20 MEQ tablet TAKE 1 TABLET (20 MEQ TOTAL) BY MOUTH DAILY AS NEEDED (WHEN YOU TAKE LASIX).   levothyroxine (SYNTHROID) 75 MCG tablet Take 75 mcg by mouth daily before breakfast.   metFORMIN (GLUCOPHAGE) 1000 MG tablet Take 1,000 mg by mouth 2 (two) times daily with a meal.   Multiple Vitamin (MULTIVITAMIN) tablet Take 1 tablet by mouth daily.   sacubitril-valsartan (ENTRESTO) 49-51 MG Take 1 tablet by mouth 2 (two) times daily.   spironolactone (ALDACTONE) 25 MG tablet Take 25 mg by mouth  daily.   No current facility-administered medications on file prior to visit.     Allergies:   Pregabalin and Ramipril   Social History   Tobacco Use   Smoking status: Never   Smokeless tobacco: Never  Substance Use Topics   Alcohol use: Not Currently   Drug use: Never    Family History: family history includes Colon cancer in her father; Dementia in her father; Diabetes in her paternal grandfather; Heart disease in her paternal grandfather; Ovarian cancer (age of onset: 31) in an other family member; Prostate cancer (age of onset: 64) in her father; Rectal cancer in an other family member; Skin cancer (age of onset: 66) in her brother; Uterine cancer (age of onset: 41) in her paternal grandmother.  ROS:   Please see the history of present illness.   (+) Chest discomfort Additional pertinent ROS otherwise unremarkable  EKGs/Labs/Other Studies Reviewed:    The following studies were reviewed today:  Echo 07/18/20 1. Left ventricular ejection fraction, by estimation, is 40 to 45%. The  left ventricle has mildly decreased function. The left ventricle  demonstrates regional wall motion abnormalities. Global hypokinesis, more  pronounced at apex. There is mild left  ventricular hypertrophy. Left ventricular diastolic parameters are  consistent with Grade I diastolic dysfunction (impaired relaxation).   2. Right ventricular systolic function is normal. The right ventricular  size is normal. Tricuspid regurgitation signal is inadequate for assessing  PA pressure.   3. The mitral valve is normal in structure. Trivial mitral valve  regurgitation.   4. The aortic valve is tricuspid. Aortic valve regurgitation is not  visualized. No aortic stenosis is present.   5. The inferior vena cava is normal in size with greater than 50%  respiratory variability, suggesting right atrial pressure of 3 mmHg.   Comparison(s): Prior studies done in [place], [place]. Prior EF 40-45% by   Report.  Echo 07/10/2018 (care everywhere) Study Conclusions  - Left ventricle: The cavity size is normal. Wall thickness is   normal. The estimated ejection fraction was in the range of 40%   to 45%. Doppler parameters are consistent with abnormal left   ventricular relaxation (grade 1 diastolic dysfunction). The   global longitudinal strain was -8.54%. - Right ventricle: Pacer wire noted in right ventricle. - Inferior vena cava: The vessel was normal in size; the   respirophasic diameter changes were in the normal range (</= 50%);   findings are consistent with normal central venous pressure.  ------------------------------------------------------------------- Cardiac Anatomy  Left ventricle: - The cavity size is normal. Wall thickness is normal. The   estimated ejection fraction was in the range of 40% to 45%. The   global longitudinal strain was -8.54%. - Doppler parameters are consistent with abnormal left ventricular   relaxation (grade 1 diastolic dysfunction).  Aortic valve: - The valve was structurally normal. Cusp separation was normal. Doppler: - Transvalvular velocity is within the normal range. There is no   stenosis. There was no significant regurgitation. - The ratio of LVOT to aortic valve peak velocity is 0.52.  AORTA:  Aortic root: The aortic root is not dilated.  Mitral valve: - The valve is structurally normal. Leaflet separation was normal. Doppler: - Transvalvular velocity is within the normal range. There is no   evidence for stenosis. There was trivial regurgitation. - The mean diastolic gradient is 45mm Hg.  Left atrium: - The atrium is normal in size.  Right ventricle: - The cavity size is normal. Wall thickness is normal. Pacer wire   noted in right ventricle. Systolic function is normal.  Pulmonic valve: - The valve was structurally normal. Doppler: - There was trivial regurgitation.  Tricuspid valve: - The valve was structurally  normal. Leaflet separation was normal. Doppler: - Transvalvular velocity is within the normal range. There is no   evidence for stenosis. There was trivial regurgitation.  Pulmonary artery: - Not well visualized.  Right atrium: - The atrium is normal in size.  Pericardium: - The pericardium was normal in appearance. There is no pericardial   effusion.  Systemic veins: Inferior vena cava: - The vessel was normal in size; the respirophasic diameter changes   were in the normal range (&gt;= 50%); findings are consistent with   normal central venous pressure. - The internal diameter is 1.2cm. - The proximal internal diameter during inspiration is 0.3cm.  EKG:  EKG is personally reviewed.   10/20/2021: EKG was not ordered. 03/14/20: a-sensed, biv-paced rhythm at 77 bpm  Recent Labs: 09/26/2021: BUN 14; Creatinine, Ser 0.82; Hemoglobin 13.8; Platelets 324; Potassium 4.6; Sodium 139   Recent Lipid Panel No results found for: CHOL, TRIG, HDL, CHOLHDL, VLDL, LDLCALC, LDLDIRECT  Physical Exam:    VS:  BP 92/60    Pulse 77    Ht 5\' 5"  (1.651 m)    Wt 238 lb 6.4 oz (108.1 kg)    SpO2 93%    BMI 39.67 kg/m     Wt Readings from Last 3 Encounters:  10/20/21 238 lb 6.4 oz (108.1 kg)  09/26/21 239 lb (108.4 kg)  08/09/21 240 lb 9.6 oz (109.1 kg)    GEN: Well nourished, well developed in no acute distress HEENT: Normal, moist mucous membranes NECK: No JVD CARDIAC: regular rhythm, normal S1 and S2, no rubs or gallops. No murmur. VASCULAR: Radial and DP pulses 2+ bilaterally. No carotid bruits RESPIRATORY:  Clear to auscultation without rales, wheezing or rhonchi  ABDOMEN: Soft, non-tender, non-distended MUSCULOSKELETAL:  Ambulates independently SKIN: Warm and dry, no edema NEUROLOGIC:  Alert and oriented x 3. No focal neuro deficits noted. PSYCHIATRIC:  Normal affect   ASSESSMENT:    1. 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 Nonischemic cardiomyopathy ([place])   3. Diabetes  mellitus type 2 in obese ([place])   4. Mixed hyperlipidemia   5. Class 2 severe obesity due to excess calories with serious comorbidity and body mass index (BMI) of 39.0 to 39.9 in adult [facility])     PLAN:    Nonischemic cardiomyopathy: likely 2/2 anthracycline treatment for breast cancer -NYHA class I-II -s/p CRT, followed by Dr. Rayann Heman -Continue entresto 49-51 mg dose BID.  -tolerating carvedilol 25 mg BID -tolerating spironolactone 25 mg daily -tolerating jardiance -lasix as needed only, use potassium if using lasix.  Obesity, BMI 39 -we discussed  with her history of cardiomyopathy, diabetes, and obesity that [place] would be an option for her. She is concerned about injections but will think about it and contact me if she changes her mind  History of breast cancer, in remission, treated with lumpectomy, sentinel lymph node, adriamycin, cytoxan, taxol, radiation. Treated with tamoxifen x 5years, no longer on letrozole -followed with Dr. Jana Hakim for monitoring  Mixed hyperlipidemia: -continue atorvastatin 40 mg daily -lipids per [facility] : 07/26/21: Tchol 115, HDL 35, LDL 51, TG 174 (unknown if fasting) 07/28/20: Tchol 126, HDL 33, LDL 66, TG 158  OSA: -continue CPAP  Type II diabetes -continue jardiance and metformin -continue aspirin  Cardiac risk counseling and prevention recommendations: -recommend heart healthy/Mediterranean diet, with whole grains, fruits, vegetable, fish, lean meats, nuts, and olive oil. Limit salt. -recommend moderate walking, 3-5 times/week for 30-50 minutes each session. Aim for at least 150 minutes.week. Goal should be pace of 3 miles/hours, or walking 1.5 miles in 30 minutes -recommend avoidance of tobacco products. Avoid excess alcohol.  Plan for follow up: 1 year or sooner as needed.   Medication Adjustments/Labs and Tests Ordered: Current medicines are reviewed at length with the patient today.  Concerns regarding medicines are outlined above.   No  orders of the defined types were placed in this encounter.  No orders of the defined types were placed in this encounter.  Patient Instructions  Medication Instructions:  Your Physician recommend you continue on your current medication as directed.    If you are interested in GLP1-RA after researching, please let me know.  *If you need a refill on your cardiac medications before your next appointment, please call your pharmacy*   Lab Work: None ordered today   Testing/Procedures: None ordered today   Follow-Up: At [facility], you and your health needs are our priority.  As part of our continuing mission to provide you with exceptional heart care, we have created designated Provider Care Teams.  These Care Teams include your primary Cardiologist (physician) and Advanced Practice Providers (APPs -  Physician Assistants and Nurse Practitioners) who all work together to provide you with the care you need, when you need it.  We recommend signing up for the patient portal called "MyChart".  Sign up information is provided on this After Visit Summary.  MyChart is used to connect with patients for Virtual Visits (Telemedicine).  Patients are able to view lab/test results, encounter notes, upcoming appointments, etc.  Non-urgent messages can be sent to your provider as well.   To learn more about what you can do with MyChart, go to [email or web address].    Your next appointment:   1 year(s)  The format for your next appointment:   In Person  Provider:   Buford Dresser, MD    Other Instructions    I,Mathew Stumpf,acting as a scribe for Buford Dresser, MD.,have documented all relevant documentation on the behalf of Buford Dresser, MD,as directed by  Buford Dresser, MD while in the presence of Buford Dresser, MD.  I, Buford Dresser, MD, have reviewed all documentation for this visit. The documentation on 10/20/21 for the exam, diagnosis,  procedures, and orders are all accurate and complete.   Signed, Buford Dresser, MD PhD 10/20/2021   [place]

## 2022-01-03 ENCOUNTER — Telehealth: Payer: Self-pay | Admitting: Adult Health

## 2022-01-03 NOTE — Telephone Encounter (Signed)
Rescheduled appointment per provider template. Patient is aware of the changes made to her upcoming appointment 

## 2022-01-09 ENCOUNTER — Ambulatory Visit (INDEPENDENT_AMBULATORY_CARE_PROVIDER_SITE_OTHER): Payer: 59

## 2022-01-09 DIAGNOSIS — T451X5D Adverse effect of antineoplastic and immunosuppressive drugs, subsequent encounter: Secondary | ICD-10-CM

## 2022-01-09 DIAGNOSIS — I427 Cardiomyopathy due to drug and external agent: Secondary | ICD-10-CM

## 2022-01-09 DIAGNOSIS — T451X5A Adverse effect of antineoplastic and immunosuppressive drugs, initial encounter: Secondary | ICD-10-CM

## 2022-01-10 ENCOUNTER — Telehealth: Payer: Self-pay

## 2022-01-10 NOTE — Telephone Encounter (Signed)
Patient called in stating that she is out of town and she will be for the next week. She states that when she gets back home she will send a remote transmission.  ?

## 2022-01-15 LAB — CUP PACEART REMOTE DEVICE CHECK
Battery Remaining Longevity: 90 mo
Battery Remaining Percentage: 100 %
Brady Statistic RA Percent Paced: 0 %
Brady Statistic RV Percent Paced: 100 %
Date Time Interrogation Session: 20230410134500
HighPow Impedance: 151 Ohm
Implantable Lead Implant Date: 20110301
Implantable Lead Implant Date: 20110301
Implantable Lead Implant Date: 20110301
Implantable Lead Location: 753858
Implantable Lead Location: 753859
Implantable Lead Location: 753860
Implantable Lead Model: 185
Implantable Lead Model: 350
Implantable Lead Model: 4592
Implantable Lead Serial Number: 131585
Implantable Lead Serial Number: 28731977
Implantable Lead Serial Number: 327266
Implantable Pulse Generator Implant Date: 20180130
Lead Channel Impedance Value: 615 Ohm
Lead Channel Impedance Value: 642 Ohm
Lead Channel Impedance Value: 765 Ohm
Lead Channel Pacing Threshold Amplitude: 0.7 V
Lead Channel Pacing Threshold Amplitude: 0.7 V
Lead Channel Pacing Threshold Amplitude: 1.1 V
Lead Channel Pacing Threshold Pulse Width: 0.4 ms
Lead Channel Pacing Threshold Pulse Width: 0.4 ms
Lead Channel Pacing Threshold Pulse Width: 0.4 ms
Lead Channel Setting Pacing Amplitude: 1.1 V
Lead Channel Setting Pacing Amplitude: 2 V
Lead Channel Setting Pacing Amplitude: 2 V
Lead Channel Setting Pacing Pulse Width: 0.4 ms
Lead Channel Setting Pacing Pulse Width: 0.4 ms
Lead Channel Setting Sensing Sensitivity: 0.6 mV
Lead Channel Setting Sensing Sensitivity: 1 mV
Pulse Gen Serial Number: 123793

## 2022-01-25 NOTE — Progress Notes (Signed)
Remote ICD transmission.   

## 2022-01-31 IMAGING — MG MM DIGITAL SCREENING BILAT W/ TOMO AND CAD
6 of 12 series · 6 of 36 positions shown · non-contrast
Comparison: Previous exam(s).

CLINICAL DATA: Screening. History of LEFT breast cancer and
lumpectomy in 7448.

EXAM:
DIGITAL SCREENING BILATERAL MAMMOGRAM WITH TOMOSYNTHESIS AND CAD
TECHNIQUE: Bilateral screening digital craniocaudal and mediolateral oblique
mammograms were obtained. Bilateral screening digital breast
tomosynthesis was performed. The images were evaluated with
computer-aided detection.

[L MLO synth-2D (1 of 2)]
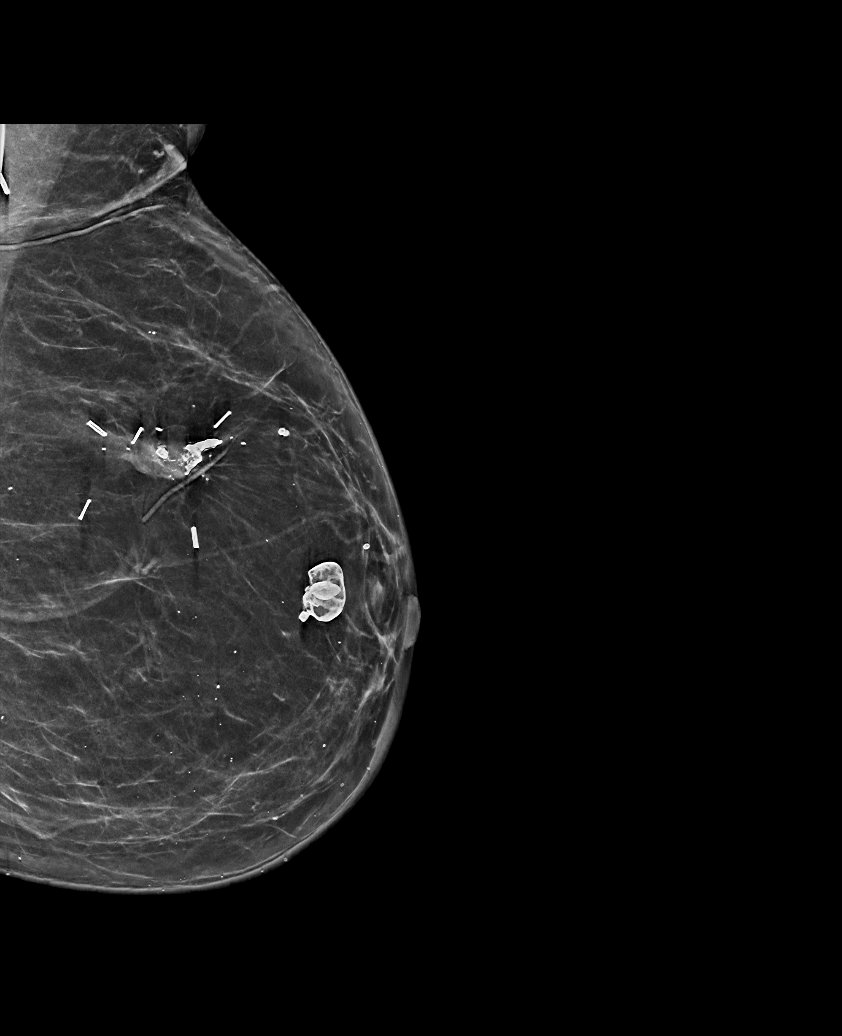

[R CC synth-2D]
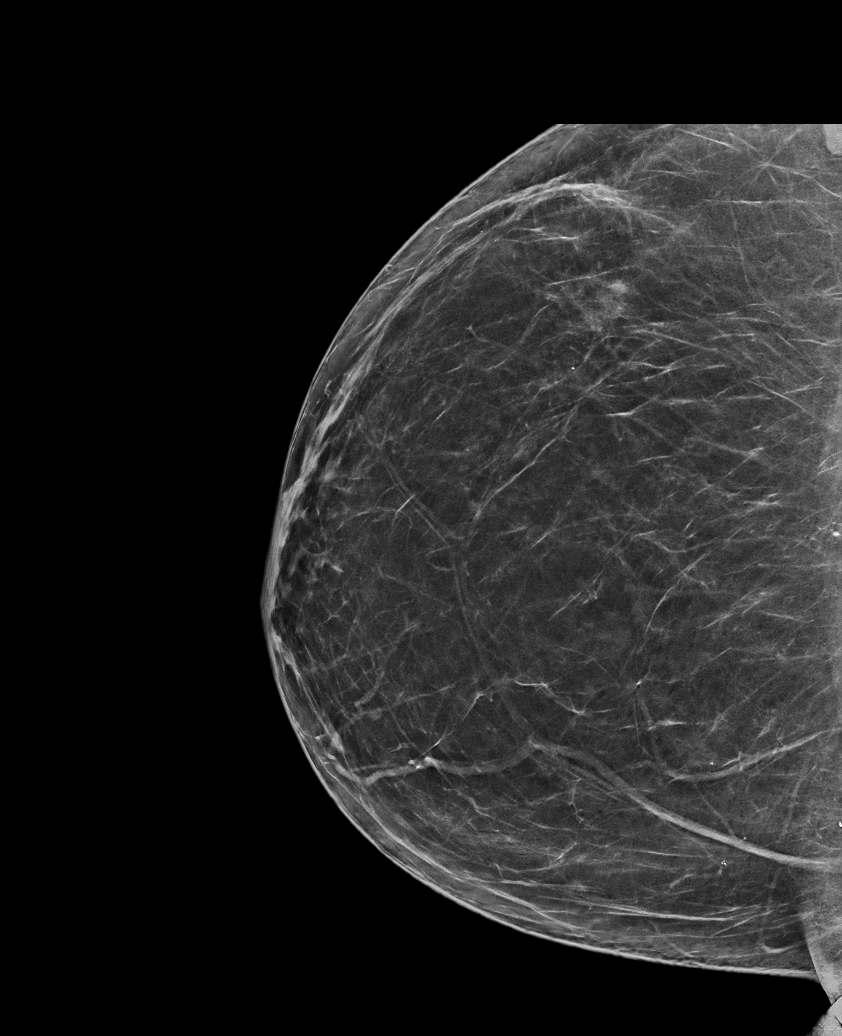

[R XCCL synth-2D]
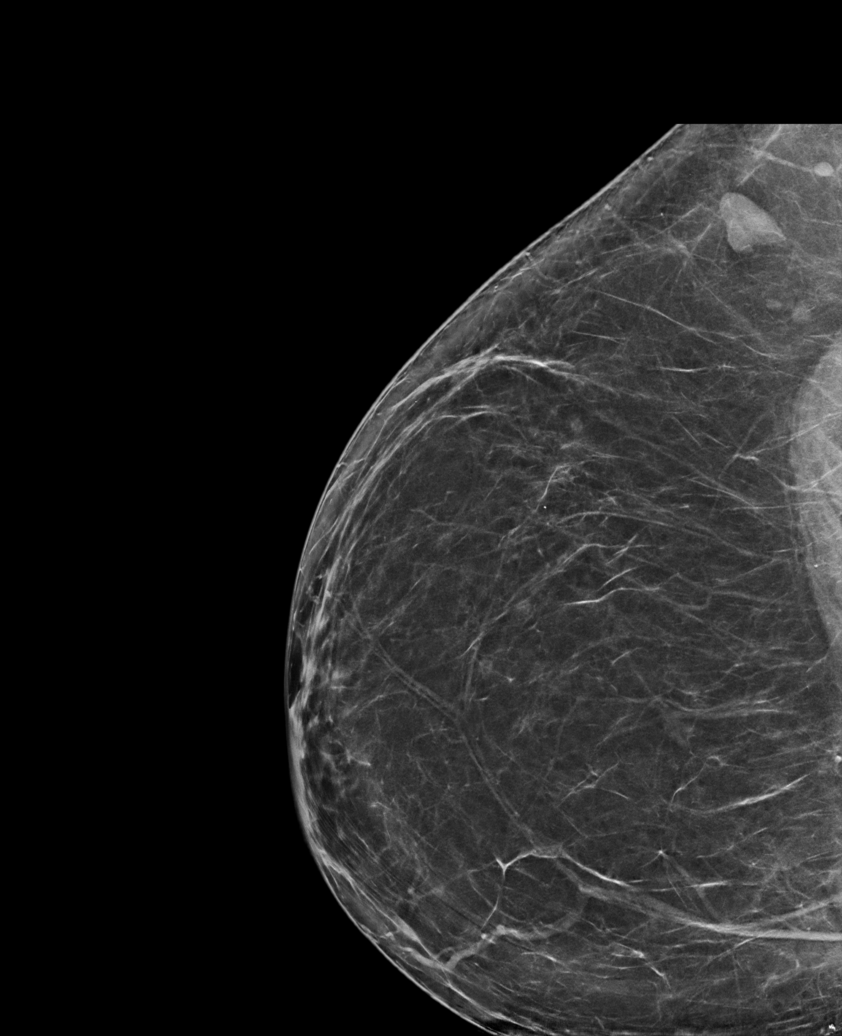

[R MLO synth-2D]
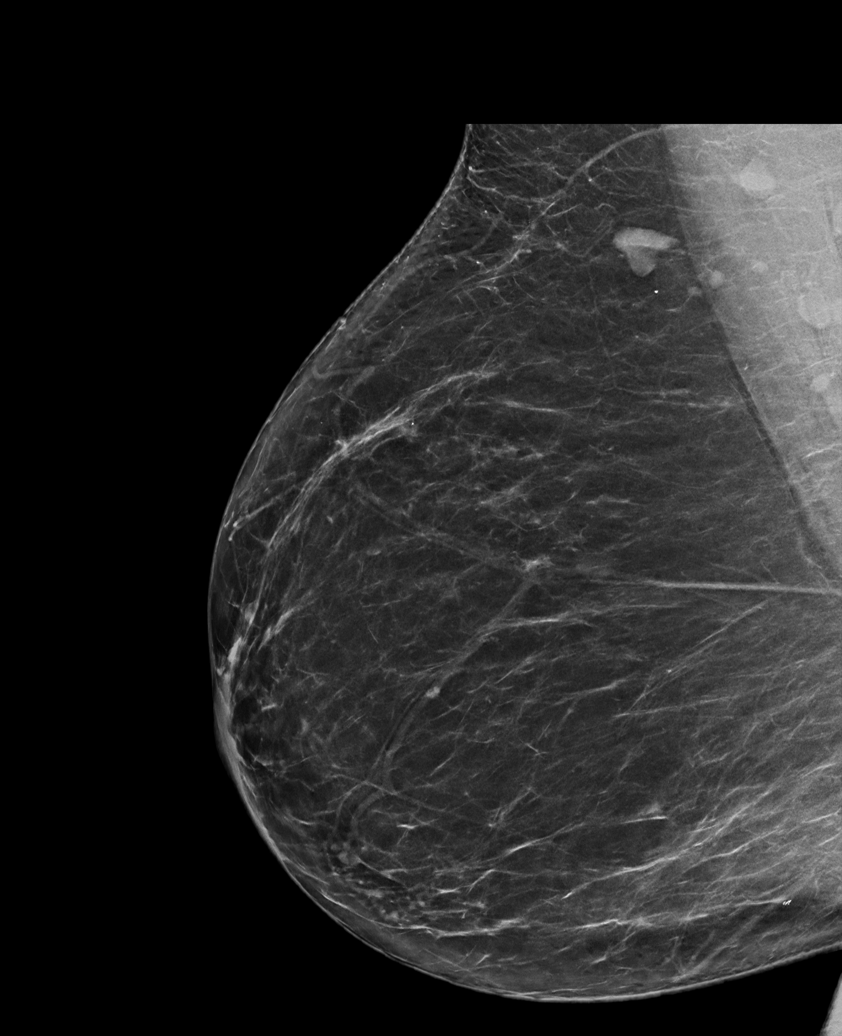

[L MLO synth-2D (2 of 2)]
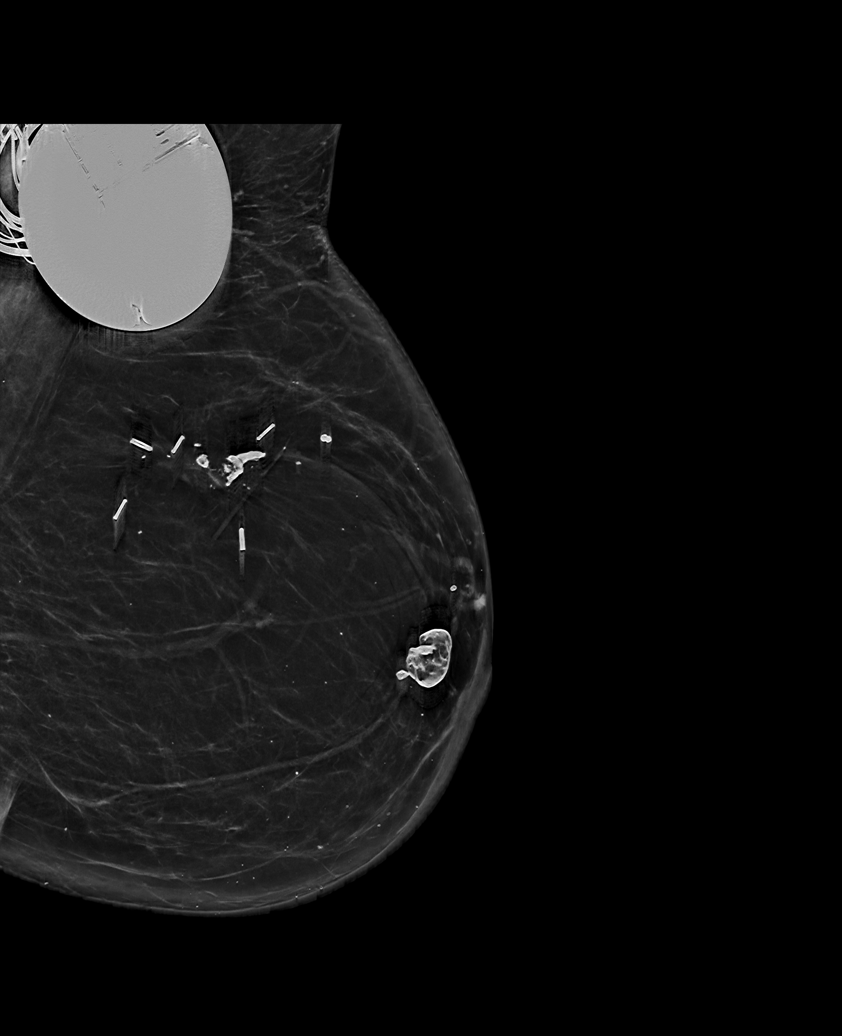

[L CC synth-2D]
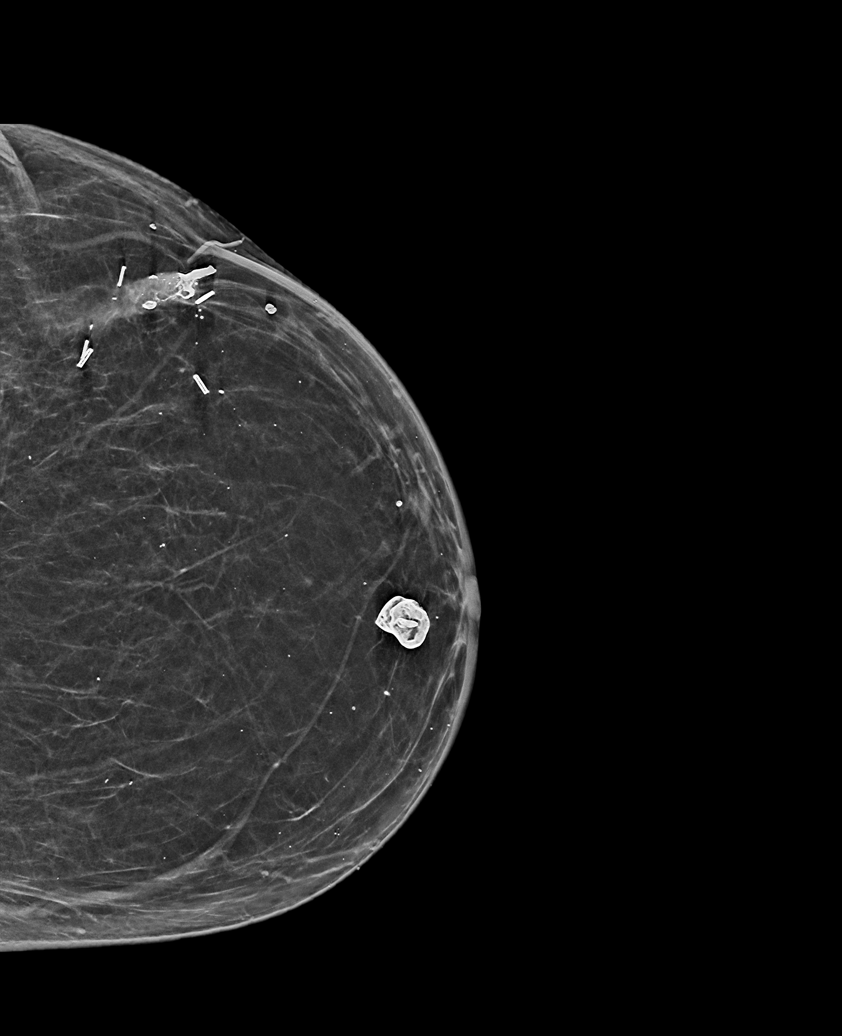

[6 of 36 positions shown; findings below may reference images not displayed]

ACR Breast Density Category b: There are scattered areas of
fibroglandular density.
FINDINGS: There are no findings suspicious for malignancy.

LEFT lumpectomy changes are again noted.

The images were evaluated with computer-aided detection.
IMPRESSION: No mammographic evidence of malignancy. A result letter of this
screening mammogram will be mailed directly to the patient.

RECOMMENDATION:
Screening mammogram in one year. (Code:7B-C-6DE)

BI-RADS CATEGORY  2: Benign.

## 2022-02-09 ENCOUNTER — Encounter: Payer: 59 | Admitting: Adult Health

## 2022-02-13 ENCOUNTER — Telehealth: Payer: Self-pay | Admitting: Cardiology

## 2022-02-13 ENCOUNTER — Telehealth: Payer: Self-pay | Admitting: Adult Health

## 2022-02-13 ENCOUNTER — Other Ambulatory Visit: Payer: Self-pay | Admitting: Cardiology

## 2022-02-13 DIAGNOSIS — I428 Other cardiomyopathies: Secondary | ICD-10-CM

## 2022-02-13 DIAGNOSIS — I5042 Chronic combined systolic (congestive) and diastolic (congestive) heart failure: Secondary | ICD-10-CM

## 2022-02-13 MED ORDER — POTASSIUM CHLORIDE CRYS ER 20 MEQ PO TBCR: EXTENDED_RELEASE_TABLET | ORAL | 1 refills | Status: DC

## 2022-02-13 NOTE — Telephone Encounter (Signed)
?*  STAT* If patient is at the pharmacy, call can be transferred to refill team. ? ? ?1. Which medications need to be refilled? (please list name of each medication and dose if known) furosemide (LASIX) 40 MG tablet ? ?2. Which pharmacy/location (including street and city if local pharmacy) is medication to be sent to? [id number] [address]? ?3. Do they need a 30 day or 90 day supply? 90 day supply ? ? ?Patient is completely out of medication. ?

## 2022-02-13 NOTE — Telephone Encounter (Signed)
.  Called pt per 5/8 inbasket , Patient was unavailable, a message with appt time and date was left with number on file. ' ?

## 2022-02-13 NOTE — Addendum Note (Signed)
Addended by: Vennie Homans on: 02/13/2022 03:41 PM ? ? Modules accepted: Orders ? ?

## 2022-02-13 NOTE — Telephone Encounter (Signed)
Rx(s) sent to pharmacy electronically.  

## 2022-02-14 MED ORDER — FUROSEMIDE 40 MG PO TABS: 40.0000 mg | ORAL_TABLET | Freq: Every day | ORAL | 1 refills | Status: DC | PRN

## 2022-02-14 NOTE — Telephone Encounter (Signed)
Rx(s) sent to pharmacy electronically.  

## 2022-02-16 ENCOUNTER — Encounter: Payer: 59 | Admitting: Adult Health

## 2022-03-30 ENCOUNTER — Telehealth: Payer: Self-pay | Admitting: *Deleted

## 2022-04-06 ENCOUNTER — Encounter: Payer: Self-pay | Admitting: Adult Health

## 2022-04-06 ENCOUNTER — Other Ambulatory Visit: Payer: Self-pay

## 2022-04-06 ENCOUNTER — Inpatient Hospital Stay: Payer: 59 | Attending: Adult Health | Admitting: Adult Health

## 2022-04-06 VITALS — BP 114/66 | HR 73 | Temp 98.1°F | Resp 16 | Ht 65.0 in | Wt 235.9 lb

## 2022-04-06 DIAGNOSIS — Z853 Personal history of malignant neoplasm of breast: Secondary | ICD-10-CM | POA: Diagnosis present

## 2022-04-06 DIAGNOSIS — Z923 Personal history of irradiation: Secondary | ICD-10-CM | POA: Insufficient documentation

## 2022-04-06 DIAGNOSIS — Z9221 Personal history of antineoplastic chemotherapy: Secondary | ICD-10-CM | POA: Diagnosis not present

## 2022-04-06 NOTE — Assessment & Plan Note (Signed)
Jordan Daugherty is a 61 year old woman with history of left-sided breast cancer diagnosed in 2008.  She has no clinical or radiographic sign of breast cancer recurrence.  Her most recent mammogram in April was normal.  I recommended repeat mammography in April 2023 and for her to continue to see primary care and stay up-to-date with her health maintenance as she has been.  We also discussed benefit of healthy diet and exercise which she will continue.  We will see her back in 1 year for continued follow-up.

## 2022-04-06 NOTE — Progress Notes (Signed)
[facility] Follow up:    Jordan Low, MD [address] [place]  [address]   DIAGNOSIS:  Cancer Staging  History of left breast cancer Staging form: Breast, AJCC 8th Edition - Clinical: cT2, cN1, cM0(i+), GX, ER+, PR+, HER2- - Signed by Jordan Cruel, MD on 07/21/2019 Histologic grading system: 3 grade system   SUMMARY OF ONCOLOGIC HISTORY: [place], [place] woman status post left lumpectomy and axillary lymph node dissection August 2008 for a T1, N1 invasive breast cancer, estrogen and progesterone receptor positive, HER-2 not amplified             (a) 2 out of 7 lymph nodes were involved   (1) status post adjuvant chemotherapy with cyclophosphamide/doxorubicin in dose dense fashion x4 followed by paclitaxel ("and another drug") in dose dense fashion x4   (2) adjuvant radiation given for total of 32 treatments after chemotherapy   (3) antiestrogens included tamoxifen for 7-10 years, then letrozole starting 2015, stopping January 2021   (4) genetics testing 11/19/2019 through the   Common Hereditary Gene Panel offered by [facility] found no deleterious mutations in APC, ATM, AXIN2, BARD1, BMPR1A, BRCA1, BRCA2, BRIP1, CDH1, CDK4, CDKN2A (p14ARF), CDKN2A (p16INK4a), CHEK2, CTNNA1, DICER1, EPCAM (Deletion/duplication testing only), GREM1 (promoter region deletion/duplication testing only), KIT, MEN1, MLH1, MSH2, MSH3, MSH6, MUTYH, NBN, NF1, NHTL1, PALB2, PDGFRA, PMS2, POLD1, POLE, PTEN, RAD50, RAD51C, RAD51D, RNF43, SDHB, SDHC, SDHD, SMAD4, SMARCA4. STK11, TP53, TSC1, TSC2, and VHL.  The following genes were evaluated for sequence changes only: SDHA and HOXB13 c.251G>A variant only.     (5) history of multiple late miscarriages, possible antiphospholipid antibody syndrome:              (a) negative labs for [facility] 11/10/2019   CURRENT THERAPY: Observation  INTERVAL HISTORY: Jordan Daugherty 61 y.o. female returns for follow up of her h/o left breast  cancer.  She continues on observation and her most recent bilateral screening mammogram was completed on January 18, 2021 and showed no evidence of malignancy and breast density category B.  She notes that as her breasts changes she gets older she has noticed an occasional pain which she had not previously.  She has noticed this about 2 times this past year.  Otherwise she is feeling well and has no questions or concerns today.  She is up-to-date with her health maintenance and sees Dr. Deforest Daugherty her primary care provider regularly she also has a colonoscopy scheduled next month at [facility].   Patient Active Problem List   Diagnosis Date Noted   Type 2 diabetes mellitus with hyperglycemia, without long-term current use of insulin () 08/09/2021   Diabetes mellitus type 2 in obese ([place]) 05/31/2020   Mixed hyperlipidemia 05/31/2020   Nonischemic cardiomyopathy ([place]) 05/31/2020   Genetic testing 11/20/2019   Family history of rectal cancer    Family history of uterine cancer    Family history of ovarian cancer    Family history of prostate cancer    Chronic combined systolic and diastolic heart failure ([place]) 08/09/2019   History of left breast cancer 07/21/2019   Class 2 severe obesity due to excess calories with serious comorbidity and body mass index (BMI) of 39.0 to 39.9 in adult [facility]) 07/21/2019   Antiphospholipid antibody syndrome ([place]) 07/21/2019   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  MEDICAL HISTORY: Past Medical History:  Diagnosis  Date   Breast cancer ([place]) 2007   left   Chronic systolic dysfunction of left ventricle    Dyslipidemia    Family history of ovarian cancer    Family history of prostate cancer    Family history of rectal cancer    Family history of uterine cancer    Hypothyroidism    Morbid obesity with BMI of 40.0-44.9, adult ([place])     Nonischemic cardiomyopathy ([place])    OSA on CPAP    Personal history of radiation therapy 2007   left   Type II diabetes mellitus ([place])     SURGICAL HISTORY: Past Surgical History:  Procedure Laterality Date   BREAST BIOPSY Left 2007   BREAST LUMPECTOMY Left 2007   ICD IMPLANT  11/06/2016   [employer] Dynagen BiV ICD implanted for primary prevention   OOPHORECTOMY Right 05/2005   dermoid    SOCIAL HISTORY: Social History   Socioeconomic History   Marital status: Married    Spouse name: Not on file   Number of children: Not on file   Years of education: Not on file   Highest education level: Not on file  Occupational History   Not on file  Tobacco Use   Smoking status: Never   Smokeless tobacco: Never  Vaping Use   Vaping Use: Not on file  Substance and Sexual Activity   Alcohol use: Not Currently   Drug use: Never   Sexual activity: Yes    Birth control/protection: None  Other Topics Concern   Not on file  Social History Narrative   Lives in [place] from [place] Determinants of Health   Financial Resource Strain: Not on file  Food Insecurity: Not on file  Transportation Needs: Not on file  Physical Activity: Not on file  Stress: Not on file  Social Connections: Not on file  Intimate Partner Violence: Not on file    FAMILY HISTORY: Family History  Problem Relation Age of Onset   Prostate cancer Father 52   Dementia Father    Colon cancer Father    Skin cancer Brother 40   Uterine cancer Paternal Grandmother 82   Diabetes Paternal Grandfather    Heart disease Paternal Grandfather    Ovarian cancer Other 84       MGMs sister   Rectal cancer Other        MGMs mother    Review of Systems  Constitutional:  Negative for appetite change, chills, fatigue, fever and unexpected weight change.  HENT:   Negative for hearing loss, lump/mass and trouble swallowing.   Eyes:  Negative for eye problems and icterus.  Respiratory:   Negative for chest tightness, cough and shortness of breath.   Cardiovascular:  Negative for chest pain, leg swelling and palpitations.  Gastrointestinal:  Negative for abdominal distention, abdominal pain, constipation, diarrhea, nausea and vomiting.  Endocrine: Negative for hot flashes.  Genitourinary:  Negative for difficulty urinating.   Musculoskeletal:  Negative for arthralgias.  Skin:  Negative for itching and rash.  Neurological:  Negative for dizziness, extremity weakness, headaches and numbness.  Hematological:  Negative for adenopathy. Does not bruise/bleed easily.  Psychiatric/Behavioral:  Negative for depression. The patient is not nervous/anxious.       PHYSICAL EXAMINATION  ECOG PERFORMANCE STATUS: 1 - Symptomatic but completely ambulatory  Vitals:   04/06/22 0907  BP: 114/66  Pulse: 73  Resp: 16  Temp: 98.1 F (36.7 C)  SpO2: 99%  Body mass  index is 39.26 kg/m.   Physical Exam Constitutional:      General: She is not in acute distress.    Appearance: Normal appearance. She is not toxic-appearing.  HENT:     Head: Normocephalic and atraumatic.  Eyes:     General: No scleral icterus. Cardiovascular:     Rate and Rhythm: Normal rate and regular rhythm.     Pulses: Normal pulses.     Heart sounds: Normal heart sounds.  Pulmonary:     Effort: Pulmonary effort is normal.     Breath sounds: Normal breath sounds.  Chest:     Comments: Right breast is benign left breast status postlumpectomy and radiation no sign of local recurrence. Abdominal:     General: Abdomen is flat. Bowel sounds are normal. There is no distension.     Palpations: Abdomen is soft.     Tenderness: There is no abdominal tenderness.  Musculoskeletal:        General: No swelling.     Cervical back: Neck supple.  Lymphadenopathy:     Cervical: No cervical adenopathy.  Skin:    General: Skin is warm and dry.     Findings: No rash.  Neurological:     General: No focal deficit present.      Mental Status: She is alert.  Psychiatric:        Mood and Affect: Mood normal.        Behavior: Behavior normal.     LABORATORY DATA:  CBC    Component Value Date/Time   WBC 8.8 09/26/2021 1229   WBC 9.0 11/10/2019 1430   RBC 4.78 09/26/2021 1229   RBC 4.69 11/10/2019 1430   HGB 13.8 09/26/2021 1229   HCT 42.3 09/26/2021 1229   PLT 324 09/26/2021 1229   MCV 89 09/26/2021 1229   MCH 28.9 09/26/2021 1229   MCH 29.6 11/10/2019 1430   MCHC 32.6 09/26/2021 1229   MCHC 32.8 11/10/2019 1430   RDW 12.3 09/26/2021 1229   LYMPHSABS 2.4 11/10/2019 1430   MONOABS 0.7 11/10/2019 1430   EOSABS 0.1 11/10/2019 1430   BASOSABS 0.0 11/10/2019 1430    CMP     Component Value Date/Time   NA 139 09/26/2021 1229   K 4.6 09/26/2021 1229   CL 102 09/26/2021 1229   CO2 22 09/26/2021 1229   GLUCOSE 146 (H) 09/26/2021 1229   GLUCOSE 179 (H) 11/10/2019 1430   BUN 14 09/26/2021 1229   CREATININE 0.82 09/26/2021 1229   CREATININE 1.02 (H) 10/12/2019 1415   CALCIUM 9.8 09/26/2021 1229   PROT 8.1 11/10/2019 1430   ALBUMIN 4.2 11/10/2019 1430   AST 16 11/10/2019 1430   AST 21 10/12/2019 1415   ALT 23 11/10/2019 1430   ALT 25 10/12/2019 1415   ALKPHOS 103 11/10/2019 1430   BILITOT 1.0 11/10/2019 1430   BILITOT 1.0 10/12/2019 1415   GFRNONAA 77 06/29/2020 1127   GFRNONAA >60 10/12/2019 1415   GFRAA 89 06/29/2020 1127   GFRAA >60 10/12/2019 1415    ASSESSMENT and THERAPY PLAN:   History of left breast cancer Jordan Daugherty is a 61 year old woman with history of left-sided breast cancer diagnosed in 2008.  She has no clinical or radiographic sign of breast cancer recurrence.  Her most recent mammogram in April was normal.  I recommended repeat mammography in April 2023 and for her to continue to see primary care and stay up-to-date with her health maintenance as she has been.  We also discussed benefit  of healthy diet and exercise which she will continue.  We will see her back in 1 year for  continued follow-up.    All questions were answered. The patient knows to call the clinic with any problems, questions or concerns. We can certainly see the patient much sooner if necessary.  Total encounter time:20 minutes*in face-to-face visit time, chart review, lab review, care coordination, order entry, and documentation of the encounter time.    Wilber Bihari, NP 04/06/22 10:04 AM Medical Oncology and Hematology [facility] [place], [place] [address] Tel. [phone]    Fax. [phone]  *Total Encounter Time as defined by the Centers for Medicare and Medicaid Services includes, in addition to the face-to-face time of a patient visit (documented in the note above) non-face-to-face time: obtaining and reviewing outside history, ordering and reviewing medications, tests or procedures, care coordination (communications with other health care professionals or caregivers) and documentation in the medical record.

## 2022-04-23 ENCOUNTER — Ambulatory Visit (INDEPENDENT_AMBULATORY_CARE_PROVIDER_SITE_OTHER): Payer: 59

## 2022-04-23 DIAGNOSIS — I428 Other cardiomyopathies: Secondary | ICD-10-CM | POA: Diagnosis not present

## 2022-04-25 LAB — CUP PACEART REMOTE DEVICE CHECK
Battery Remaining Longevity: 90 mo
Battery Remaining Percentage: 100 %
Brady Statistic RA Percent Paced: 0 %
Brady Statistic RV Percent Paced: 100 %
Date Time Interrogation Session: 20230714002100
HighPow Impedance: 147 Ohm
Implantable Lead Implant Date: 20110301
Implantable Lead Implant Date: 20110301
Implantable Lead Implant Date: 20110301
Implantable Lead Location: 753858
Implantable Lead Location: 753859
Implantable Lead Location: 753860
Implantable Lead Model: 185
Implantable Lead Model: 350
Implantable Lead Model: 4592
Implantable Lead Serial Number: 131585
Implantable Lead Serial Number: 28731977
Implantable Lead Serial Number: 327266
Implantable Pulse Generator Implant Date: 20180130
Lead Channel Impedance Value: 581 Ohm
Lead Channel Impedance Value: 643 Ohm
Lead Channel Impedance Value: 718 Ohm
Lead Channel Pacing Threshold Amplitude: 0.7 V
Lead Channel Pacing Threshold Amplitude: 0.7 V
Lead Channel Pacing Threshold Amplitude: 1.1 V
Lead Channel Pacing Threshold Pulse Width: 0.4 ms
Lead Channel Pacing Threshold Pulse Width: 0.4 ms
Lead Channel Pacing Threshold Pulse Width: 0.4 ms
Lead Channel Setting Pacing Amplitude: 1.1 V
Lead Channel Setting Pacing Amplitude: 2 V
Lead Channel Setting Pacing Amplitude: 2 V
Lead Channel Setting Pacing Pulse Width: 0.4 ms
Lead Channel Setting Pacing Pulse Width: 0.4 ms
Lead Channel Setting Sensing Sensitivity: 0.6 mV
Lead Channel Setting Sensing Sensitivity: 1 mV
Pulse Gen Serial Number: 123793

## 2022-04-27 ENCOUNTER — Ambulatory Visit (INDEPENDENT_AMBULATORY_CARE_PROVIDER_SITE_OTHER): Payer: 59 | Admitting: Obstetrics & Gynecology

## 2022-04-27 VITALS — BP 86/60 | HR 77 | Wt 240.0 lb

## 2022-04-27 DIAGNOSIS — Z1231 Encounter for screening mammogram for malignant neoplasm of breast: Secondary | ICD-10-CM

## 2022-04-27 DIAGNOSIS — I427 Cardiomyopathy due to drug and external agent: Secondary | ICD-10-CM

## 2022-04-27 DIAGNOSIS — T451X5A Adverse effect of antineoplastic and immunosuppressive drugs, initial encounter: Secondary | ICD-10-CM

## 2022-04-27 DIAGNOSIS — Z853 Personal history of malignant neoplasm of breast: Secondary | ICD-10-CM | POA: Diagnosis not present

## 2022-04-27 DIAGNOSIS — E2839 Other primary ovarian failure: Secondary | ICD-10-CM | POA: Diagnosis not present

## 2022-04-27 DIAGNOSIS — Z01419 Encounter for gynecological examination (general) (routine) without abnormal findings: Secondary | ICD-10-CM

## 2022-04-27 NOTE — Patient Instructions (Signed)
Please call to schedule mammogram.

## 2022-04-27 NOTE — Progress Notes (Unsigned)
61 y.o. [id number] Married female here for annual exam.  H/o breast cancer in 2007.  Had chemotherapy induced cardiomyopathy.  Followed by Dr. Harrell Gave.  Has switched endocrinologist this year.  She is seeing Dr. Lysle Rubens at [facility].    Has colonoscopy scheduled next week.  This will be with Dr. Watt Climes.    Denies vaginal bleeding.    No LMP recorded. Patient is postmenopausal.          Sexually active: Yes.    The current method of family planning is post menopausal status.    Smoker:  no  Health Maintenance: Pap:  03/2021 neg with neg HR HPV History of abnormal Pap:  no MMG:  01/2021 Colonoscopy:  scheduled next week. BMD:   2018 in [place]: does with Dr. Lysle Rubens   reports that she has never smoked. She has never used smokeless tobacco. She reports that she does not currently use alcohol. She reports that she does not use drugs.  Past Medical History:  Diagnosis Date   Breast cancer ([place]) 2007   left   Chronic systolic dysfunction of left ventricle    Dyslipidemia    Family history of ovarian cancer    Family history of prostate cancer    Family history of rectal cancer    Family history of uterine cancer    Hypothyroidism    Morbid obesity with BMI of 40.0-44.9, adult ([place])    Nonischemic cardiomyopathy ([place])    OSA on CPAP    Personal history of radiation therapy 2007   left   Type II diabetes mellitus ([place])     Past Surgical History:  Procedure Laterality Date   BREAST BIOPSY Left 2007   BREAST LUMPECTOMY Left 2007   ICD IMPLANT  11/06/2016   [employer] Dynagen BiV ICD implanted for primary prevention   OOPHORECTOMY Right 05/2005   dermoid    Current Outpatient Medications  Medication Sig Dispense Refill   aspirin EC 81 MG tablet Take 81 mg by mouth daily.     atorvastatin (LIPITOR) 40 MG tablet Take 40 mg by mouth daily.     Calcium Acetate, Phos Binder, (CALCIUM ACETATE PO) Take 400 mg by mouth daily.     carvedilol (COREG) 25 MG  tablet Take 25 mg by mouth 2 (two) times daily with a meal.     furosemide (LASIX) 40 MG tablet Take 1 tablet (40 mg total) by mouth daily as needed. 90 tablet 1   gabapentin (NEURONTIN) 300 MG capsule Take 900 mg by mouth 2 (two) times daily.     glucose blood (FREESTYLE LITE) test strip 1 each by Other route as needed for other. Use as instructed     JARDIANCE 25 MG TABS tablet Take 25 mg by mouth daily.     levothyroxine (SYNTHROID) 75 MCG tablet Take 75 mcg by mouth daily before breakfast.     metFORMIN (GLUCOPHAGE) 1000 MG tablet Take 1,000 mg by mouth 2 (two) times daily with a meal.     Multiple Vitamin (MULTIVITAMIN) tablet Take 1 tablet by mouth daily.     potassium chloride (KLOR-CON) 8 MEQ tablet Take 8 mEq by mouth daily.     sacubitril-valsartan (ENTRESTO) 49-51 MG Take 1 tablet by mouth 2 (two) times daily.     spironolactone (ALDACTONE) 25 MG tablet Take 25 mg by mouth daily.     acetaminophen (TYLENOL) 500 MG tablet Take 500 mg by mouth every 6 (six) hours as needed. (Patient not taking: Reported  on 04/06/2022)     potassium chloride SA (KLOR-CON M20) 20 MEQ tablet TAKE 1 TABLET (20 MEQ TOTAL) BY MOUTH DAILY AS NEEDED (WHEN YOU TAKE LASIX). (Patient not taking: Reported on 04/06/2022) 90 tablet 1   No current facility-administered medications for this visit.    Family History  Problem Relation Age of Onset   Prostate cancer Father 50   Dementia Father    Colon cancer Father    Skin cancer Brother 64   Uterine cancer Paternal Grandmother 62   Diabetes Paternal Grandfather    Heart disease Paternal Grandfather    Ovarian cancer Other 62       MGMs sister   Rectal cancer Other        MGMs mother   ROS: Genitourinary:negative  Exam:   BP (!) 86/60   Pulse 77   Wt 240 lb (108.9 kg)   BMI 39.94 kg/m      General appearance: alert, cooperative and appears stated age Head: Normocephalic, without obvious abnormality, atraumatic Neck: no adenopathy, supple, symmetrical,  trachea midline and thyroid normal to inspection and palpation Lungs: clear to auscultation bilaterally Breasts: normal appearance, no masses or tenderness, well healed scars on left breast present Heart: regular rate and rhythm Abdomen: soft, non-tender; bowel sounds normal; no masses,  no organomegaly Extremities: extremities normal, atraumatic, no cyanosis or edema Skin: Skin color, texture, turgor normal. No rashes or lesions Lymph nodes: Cervical, supraclavicular, and axillary nodes normal. No abnormal inguinal nodes palpated Neurologic: Grossly normal   Pelvic: External genitalia:  no lesions              Urethra:  normal appearing urethra with no masses, tenderness or lesions              Bartholins and Skenes: normal                 Vagina: normal appearing vagina with normal color and no discharge, no lesions              Cervix: no lesions              Pap taken: No. Bimanual Exam:  Uterus:  normal size, contour, position, consistency, mobility, non-tender              Adnexa: normal adnexa and no mass, fullness, tenderness               Rectovaginal: Confirms               Anus:  normal sphincter tone, no lesions  Chaperone, Octaviano Batty, CMA, was present for exam.  Assessment/Plan: 1. Well woman exam with routine gynecological exam - Pap smear 03/2021, neg with neg HR HPV - Mammogram order placed.  Pt will call to schedule.  Last done 01/2021. - Colonoscopy is scheduled next week with Dr. Watt Climes - Bone mineral density order placed.  Last one was 2018 in [place]  - lab work done done with PCP - vaccines reviewed/updated  2. Encounter for screening mammogram for malignant neoplasm of breast - MM 3D SCREEN BREAST BILATERAL; Future  3. Hypoestrogenism - DG BONE DENSITY (DXA); Future  4. History of left breast cancer - in survivors' clinic now  5. 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 Harrell Gave

## 2022-04-30 ENCOUNTER — Other Ambulatory Visit (HOSPITAL_BASED_OUTPATIENT_CLINIC_OR_DEPARTMENT_OTHER): Payer: 59

## 2022-05-01 ENCOUNTER — Ambulatory Visit (HOSPITAL_BASED_OUTPATIENT_CLINIC_OR_DEPARTMENT_OTHER)
Admission: RE | Admit: 2022-05-01 | Discharge: 2022-05-01 | Disposition: A | Discharge status: Home / Self Care | Payer: 59 | Source: Ambulatory Visit | Attending: Obstetrics & Gynecology | Admitting: Obstetrics & Gynecology

## 2022-05-01 ENCOUNTER — Encounter (HOSPITAL_BASED_OUTPATIENT_CLINIC_OR_DEPARTMENT_OTHER): Payer: Self-pay | Admitting: Obstetrics & Gynecology

## 2022-05-01 DIAGNOSIS — E2839 Other primary ovarian failure: Secondary | ICD-10-CM | POA: Insufficient documentation

## 2022-05-01 DIAGNOSIS — C50112 Malignant neoplasm of central portion of left female breast: Secondary | ICD-10-CM | POA: Insufficient documentation

## 2022-05-04 ENCOUNTER — Ambulatory Visit (HOSPITAL_BASED_OUTPATIENT_CLINIC_OR_DEPARTMENT_OTHER)
Admission: RE | Admit: 2022-05-04 | Discharge: 2022-05-04 | Disposition: A | Discharge status: Home / Self Care | Payer: 59 | Source: Ambulatory Visit | Attending: Obstetrics & Gynecology | Admitting: Obstetrics & Gynecology

## 2022-05-04 DIAGNOSIS — Z1231 Encounter for screening mammogram for malignant neoplasm of breast: Secondary | ICD-10-CM | POA: Diagnosis present

## 2022-05-08 ENCOUNTER — Other Ambulatory Visit: Payer: Self-pay | Admitting: Obstetrics & Gynecology

## 2022-05-08 DIAGNOSIS — R928 Other abnormal and inconclusive findings on diagnostic imaging of breast: Secondary | ICD-10-CM

## 2022-05-12 ENCOUNTER — Ambulatory Visit
Admission: RE | Admit: 2022-05-12 | Discharge: 2022-05-12 | Disposition: A | Discharge status: Home / Self Care | Payer: 59 | Source: Ambulatory Visit | Attending: Obstetrics & Gynecology | Admitting: Obstetrics & Gynecology

## 2022-05-12 ENCOUNTER — Other Ambulatory Visit: Payer: Self-pay | Admitting: Obstetrics & Gynecology

## 2022-05-12 DIAGNOSIS — R928 Other abnormal and inconclusive findings on diagnostic imaging of breast: Secondary | ICD-10-CM

## 2022-05-12 DIAGNOSIS — N6314 Unspecified lump in the right breast, lower inner quadrant: Secondary | ICD-10-CM

## 2022-05-25 NOTE — Progress Notes (Signed)
Remote ICD transmission.   

## 2022-07-23 ENCOUNTER — Other Ambulatory Visit: Payer: Self-pay | Admitting: Cardiology

## 2022-07-23 ENCOUNTER — Ambulatory Visit (INDEPENDENT_AMBULATORY_CARE_PROVIDER_SITE_OTHER): Payer: 59

## 2022-07-23 DIAGNOSIS — I428 Other cardiomyopathies: Secondary | ICD-10-CM | POA: Diagnosis not present

## 2022-07-23 DIAGNOSIS — I5042 Chronic combined systolic (congestive) and diastolic (congestive) heart failure: Secondary | ICD-10-CM

## 2022-07-23 NOTE — Telephone Encounter (Signed)
Rx(s) sent to pharmacy electronically.  

## 2022-07-24 LAB — CUP PACEART REMOTE DEVICE CHECK
Battery Remaining Longevity: 84 mo
Battery Remaining Percentage: 100 %
Brady Statistic RA Percent Paced: 0 %
Brady Statistic RV Percent Paced: 100 %
Date Time Interrogation Session: 20231013145600
HighPow Impedance: 147 Ohm
Implantable Lead Implant Date: 20110301
Implantable Lead Implant Date: 20110301
Implantable Lead Implant Date: 20110301
Implantable Lead Location: 753858
Implantable Lead Location: 753859
Implantable Lead Location: 753860
Implantable Lead Model: 185
Implantable Lead Model: 350
Implantable Lead Model: 4592
Implantable Lead Serial Number: 131585
Implantable Lead Serial Number: 28731977
Implantable Lead Serial Number: 327266
Implantable Pulse Generator Implant Date: 20180130
Lead Channel Impedance Value: 595 Ohm
Lead Channel Impedance Value: 616 Ohm
Lead Channel Impedance Value: 735 Ohm
Lead Channel Pacing Threshold Amplitude: 0.7 V
Lead Channel Pacing Threshold Amplitude: 0.7 V
Lead Channel Pacing Threshold Amplitude: 1.1 V
Lead Channel Pacing Threshold Pulse Width: 0.4 ms
Lead Channel Pacing Threshold Pulse Width: 0.4 ms
Lead Channel Pacing Threshold Pulse Width: 0.4 ms
Lead Channel Setting Pacing Amplitude: 1.1 V
Lead Channel Setting Pacing Amplitude: 2 V
Lead Channel Setting Pacing Amplitude: 2 V
Lead Channel Setting Pacing Pulse Width: 0.4 ms
Lead Channel Setting Pacing Pulse Width: 0.4 ms
Lead Channel Setting Sensing Sensitivity: 0.6 mV
Lead Channel Setting Sensing Sensitivity: 1 mV
Pulse Gen Serial Number: 123793

## 2022-08-10 NOTE — Progress Notes (Signed)
Remote ICD transmission.   

## 2022-08-11 ENCOUNTER — Other Ambulatory Visit: Payer: Self-pay | Admitting: Cardiology

## 2022-08-11 DIAGNOSIS — I5042 Chronic combined systolic (congestive) and diastolic (congestive) heart failure: Secondary | ICD-10-CM

## 2022-08-11 DIAGNOSIS — I428 Other cardiomyopathies: Secondary | ICD-10-CM

## 2022-10-17 ENCOUNTER — Ambulatory Visit (HOSPITAL_BASED_OUTPATIENT_CLINIC_OR_DEPARTMENT_OTHER): Payer: 59 | Admitting: Cardiology

## 2022-10-17 ENCOUNTER — Encounter (HOSPITAL_BASED_OUTPATIENT_CLINIC_OR_DEPARTMENT_OTHER): Payer: Self-pay | Admitting: Cardiology

## 2022-10-17 VITALS — BP 110/70 | HR 84 | Ht 65.0 in | Wt 231.8 lb

## 2022-10-17 DIAGNOSIS — I428 Other cardiomyopathies: Secondary | ICD-10-CM

## 2022-10-17 DIAGNOSIS — Z853 Personal history of malignant neoplasm of breast: Secondary | ICD-10-CM

## 2022-10-17 DIAGNOSIS — I5042 Chronic combined systolic (congestive) and diastolic (congestive) heart failure: Secondary | ICD-10-CM | POA: Diagnosis not present

## 2022-10-17 DIAGNOSIS — E1169 Type 2 diabetes mellitus with other specified complication: Secondary | ICD-10-CM | POA: Diagnosis not present

## 2022-10-17 DIAGNOSIS — T451X5D Adverse effect of antineoplastic and immunosuppressive drugs, subsequent encounter: Secondary | ICD-10-CM

## 2022-10-17 DIAGNOSIS — I427 Cardiomyopathy due to drug and external agent: Secondary | ICD-10-CM | POA: Diagnosis not present

## 2022-10-17 DIAGNOSIS — T451X5A Adverse effect of antineoplastic and immunosuppressive drugs, initial encounter: Secondary | ICD-10-CM

## 2022-10-17 DIAGNOSIS — E782 Mixed hyperlipidemia: Secondary | ICD-10-CM

## 2022-10-17 DIAGNOSIS — E669 Obesity, unspecified: Secondary | ICD-10-CM

## 2022-10-17 NOTE — Patient Instructions (Signed)
Medication Instructions:  ?Your physician recommends that you continue on your current medications as directed. Please refer to the Current Medication list given to you today.  ? ?Labwork: ?NONE ? ?Testing/Procedures: ?NONE ? ?Follow-Up: ?AS NEEDED  ? ?  ?

## 2022-10-17 NOTE — Progress Notes (Signed)
Cardiology Office Note:    Date:  10/17/2022   ID:  Jordan Daugherty, DOB October 26, 1960, MRN [id number]  PCP:  Wenda Low, MD  Cardiologist:  Buford Dresser, MD  Referring MD: Wenda Low, MD   CC: follow up  History of Present Illness:    Jordan Daugherty is a 62 y.o. 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 I initially saw her 08/05/19 as a new patient to me at the request of Wenda Low, MD for the evaluation and management of nonischemic cardiomyopathy. She was previously followed by Dr. Boyd Kerbs in [place].  Cardiac history: moved to [facility] in June 2020, had been living in [place] prior to this. She has a PMH of NICM 2/2 chemotherapy for breast cancer. Initially had fatigue, treated for bronchitis for 6 mos prior to diagnosis of heart failure. Has done well with watching salt,  Increasing activity, and CRT therapy. Has done really well on entresto but since moving to [place] her copay is $700/month. Started on jardiance, takes lasix PRN.  Today, she states that she is feeling fine.   Her blood pressure in clinic today is 110/70, but she has not yet taken her medication today.  Usually she takes an afternoon nap that correlates with her drops in blood pressure after taking her medicine. Generally she does not feel "bottomed out." Occasionally she may have a spell of lightheadedness but she typically pushes through this or waits for her symptoms to subside.   Lately she has not been exercising as much. We revisited discussion of using Ozempic. She is concerned about side effects of nausea and vomiting.  Also she endorses diet-sensitive LE edema. Typically she doesn't have significant issues with swelling.  She denies any palpitations, chest pain, or shortness of breath. No headaches, syncope, orthopnea, or PND.  Next month she will be moving to a new home in [place].   Past Medical History:  Diagnosis Date    Breast cancer ([place]) 2007   left   Chronic systolic dysfunction of left ventricle    Dyslipidemia    Family history of ovarian cancer    Family history of prostate cancer    Family history of rectal cancer    Family history of uterine cancer    Hypothyroidism    Morbid obesity with BMI of 40.0-44.9, adult ([place])    Nonischemic cardiomyopathy ([place])    OSA on CPAP    Personal history of radiation therapy 2007   left   Type II diabetes mellitus ([place])     Past Surgical History:  Procedure Laterality Date   BREAST BIOPSY Left 2007   BREAST LUMPECTOMY Left 2007   ICD IMPLANT  11/06/2016   [employer] Dynagen BiV ICD implanted for primary prevention   OOPHORECTOMY Right 05/2005   dermoid    Current Medications: Current Outpatient Medications on File Prior to Visit  Medication Sig   acetaminophen (TYLENOL) 500 MG tablet Take 500 mg by mouth every 6 (six) hours as needed.   aspirin EC 81 MG tablet Take 81 mg by mouth daily.   atorvastatin (LIPITOR) 40 MG tablet Take 40 mg by mouth daily.   Calcium Acetate, Phos Binder, (CALCIUM ACETATE PO) Take 400 mg by mouth daily.   carvedilol (COREG) 25 MG tablet Take 25 mg by mouth 2 (two) times daily with a meal.   furosemide (LASIX) 40 MG tablet TAKE 1 TABLET BY MOUTH DAILY AS NEEDED  gabapentin (NEURONTIN) 300 MG capsule Take 900 mg by mouth 2 (two) times daily.   glucose blood (FREESTYLE LITE) test strip 1 each by Other route as needed for other. Use as instructed   JARDIANCE 25 MG TABS tablet Take 25 mg by mouth daily.   levothyroxine (SYNTHROID) 75 MCG tablet Take 75 mcg by mouth daily before breakfast.   metFORMIN (GLUCOPHAGE) 1000 MG tablet Take 1,000 mg by mouth 2 (two) times daily with a meal.   Multiple Vitamin (MULTIVITAMIN) tablet Take 1 tablet by mouth daily.   potassium chloride SA (KLOR-CON M20) 20 MEQ tablet TAKE 1 TABLET (20 MEQ TOTAL) BY MOUTH DAILY AS NEEDED (WHEN YOU TAKE LASIX).   sacubitril-valsartan (ENTRESTO)  49-51 MG Take 1 tablet by mouth 2 (two) times daily.   spironolactone (ALDACTONE) 25 MG tablet Take 25 mg by mouth daily.   No current facility-administered medications on file prior to visit.     Allergies:   Pregabalin and Ramipril   Social History   Tobacco Use   Smoking status: Never   Smokeless tobacco: Never  Substance Use Topics   Alcohol use: Not Currently   Drug use: Never    Family History: family history includes Colon cancer in her father; Dementia in her father; Diabetes in her paternal grandfather; Heart disease in her paternal grandfather; Ovarian cancer (age of onset: 86) in an other family member; Prostate cancer (age of onset: 42) in her father; Rectal cancer in an other family member; Skin cancer (age of onset: 19) in her brother; Uterine cancer (age of onset: 34) in her paternal grandmother.  ROS:   Please see the history of present illness.   (+) Fatigue (+) Lightheadedness Additional pertinent ROS otherwise unremarkable  EKGs/Labs/Other Studies Reviewed:    The following studies were reviewed today:  Echo 07/18/20 1. Left ventricular ejection fraction, by estimation, is 40 to 45%. The  left ventricle has mildly decreased function. The left ventricle  demonstrates regional wall motion abnormalities. Global hypokinesis, more  pronounced at apex. There is mild left  ventricular hypertrophy. Left ventricular diastolic parameters are  consistent with Grade I diastolic dysfunction (impaired relaxation).   2. Right ventricular systolic function is normal. The right ventricular  size is normal. Tricuspid regurgitation signal is inadequate for assessing  PA pressure.   3. The mitral valve is normal in structure. Trivial mitral valve  regurgitation.   4. The aortic valve is tricuspid. Aortic valve regurgitation is not  visualized. No aortic stenosis is present.   5. The inferior vena cava is normal in size with greater than 50%  respiratory variability,  suggesting right atrial pressure of 3 mmHg.   Comparison(s): Prior studies done in [place], [place]. Prior EF 40-45% by  Report.  Echo 07/10/2018 (care everywhere) Study Conclusions  - Left ventricle: The cavity size is normal. Wall thickness is   normal. The estimated ejection fraction was in the range of 40%   to 45%. Doppler parameters are consistent with abnormal left   ventricular relaxation (grade 1 diastolic dysfunction). The   global longitudinal strain was -8.54%. - Right ventricle: Pacer wire noted in right ventricle. - Inferior vena cava: The vessel was normal in size; the   respirophasic diameter changes were in the normal range (</= 50%);   findings are consistent with normal central venous pressure.  ------------------------------------------------------------------- Cardiac Anatomy  Left ventricle: - The cavity size is normal. Wall thickness is normal. The   estimated ejection fraction was in the range of  40% to 45%. The   global longitudinal strain was -8.54%. - Doppler parameters are consistent with abnormal left ventricular   relaxation (grade 1 diastolic dysfunction).  Aortic valve: - The valve was structurally normal. Cusp separation was normal. Doppler: - Transvalvular velocity is within the normal range. There is no   stenosis. There was no significant regurgitation. - The ratio of LVOT to aortic valve peak velocity is 0.52.  AORTA:  Aortic root: The aortic root is not dilated.  Mitral valve: - The valve is structurally normal. Leaflet separation was normal. Doppler: - Transvalvular velocity is within the normal range. There is no   evidence for stenosis. There was trivial regurgitation. - The mean diastolic gradient is 52m Hg.  Left atrium: - The atrium is normal in size.  Right ventricle: - The cavity size is normal. Wall thickness is normal. Pacer wire   noted in right ventricle. Systolic function is normal.  Pulmonic valve: - The valve was  structurally normal. Doppler: - There was trivial regurgitation.  Tricuspid valve: - The valve was structurally normal. Leaflet separation was normal. Doppler: - Transvalvular velocity is within the normal range. There is no   evidence for stenosis. There was trivial regurgitation.  Pulmonary artery: - Not well visualized.  Right atrium: - The atrium is normal in size.  Pericardium: - The pericardium was normal in appearance. There is no pericardial   effusion.  Systemic veins: Inferior vena cava: - The vessel was normal in size; the respirophasic diameter changes   were in the normal range (&gt;= 50%); findings are consistent with   normal central venous pressure. - The internal diameter is 1.2cm. - The proximal internal diameter during inspiration is 0.3cm.  EKG:  EKG is personally reviewed.   10/17/2022:  Atrial sensed, biv paced rhythm at 84 bpm 10/20/2021: EKG was not ordered. 03/14/20: a-sensed, biv-paced rhythm at 77 bpm  Recent Labs: No results found for requested labs within last 365 days.   Recent Lipid Panel No results found for: "CHOL", "TRIG", "HDL", "CHOLHDL", "VLDL", "LDLCALC", "LDLDIRECT"  Physical Exam:    VS:  BP 110/70 (BP Location: Right Arm, Patient Position: Sitting, Cuff Size: Large)   Pulse 84   Ht '5\' 5"'$  (1.651 m)   Wt 231 lb 12.8 oz (105.1 kg)   BMI 38.57 kg/m     Wt Readings from Last 3 Encounters:  10/17/22 231 lb 12.8 oz (105.1 kg)  04/27/22 240 lb (108.9 kg)  04/06/22 235 lb 14.4 oz (107 kg)    GEN: Well nourished, well developed in no acute distress HEENT: Normal, moist mucous membranes NECK: No JVD CARDIAC: regular rhythm, normal S1 and S2, no rubs or gallops. No murmur. VASCULAR: Radial and DP pulses 2+ bilaterally. No carotid bruits RESPIRATORY:  Clear to auscultation without rales, wheezing or rhonchi  ABDOMEN: Soft, non-tender, non-distended MUSCULOSKELETAL:  Ambulates independently SKIN: Warm and dry, no edema NEUROLOGIC:   Alert and oriented x 3. No focal neuro deficits noted. PSYCHIATRIC:  Normal affect   ASSESSMENT:    1. Nonischemic cardiomyopathy ([place]   2. Chronic combined systolic and diastolic heart failure ([facility]   3. Cardiomyopathy due to anthracycline ([facility]   4. Diabetes mellitus type 2 in obese ([place]   5. Mixed hyperlipidemia   6. History of left breast cancer     PLAN:    Nonischemic cardiomyopathy: likely 2/2 anthracycline treatment for breast cancer -NYHA class I-II -s/p CRT -Continue entresto 49-51 mg dose BID.  -tolerating carvedilol 25  mg BID -tolerating spironolactone 25 mg daily -tolerating jardiance -lasix as needed only, use potassium if using lasix.  Type II diabetes Obesity, BMI 38.5 -we discussed with her history of cardiomyopathy, diabetes, and obesity that GLP1RA would be an option for her.  -we discussed GLP1s at length. She will consider but will wait to trial until she is settled in [place]. -she is currently on jardiance and metformin. Discussed cardiac benefit to Jardiance, would continue even if she starts GLP1. Recommended she discuss if she could stop metformin with start of GLP1 with her new provider. Last A1c 7.6 -on aspirin for primary prevention  History of breast cancer, in remission, treated with lumpectomy, sentinel lymph node, adriamycin, cytoxan, taxol, radiation. Treated with tamoxifen x 5years, no longer on letrozole -followed with oncology for monitoring  Mixed hyperlipidemia: -continue atorvastatin 40 mg daily -lipids per [facility] : 07/27/22: Tchol 125, HDL 33, LDL 59, TG 195 (unknown if fasting) 07/26/21: Tchol 115, HDL 35, LDL 51, TG 174 (unknown if fasting) 07/28/20: Tchol 126, HDL 33, LDL 66, TG 158  OSA: -continue CPAP  Cardiac risk counseling and prevention recommendations: -recommend heart healthy/Mediterranean diet, with whole grains, fruits, vegetable, fish, lean meats, nuts, and olive oil. Limit salt. -recommend moderate walking, 3-5  times/week for 30-50 minutes each session. Aim for at least 150 minutes.week. Goal should be pace of 3 miles/hours, or walking 1.5 miles in 30 minutes -recommend avoidance of tobacco products. Avoid excess alcohol.  Plan for follow up: PRN.  Medication Adjustments/Labs and Tests Ordered: Current medicines are reviewed at length with the patient today.  Concerns regarding medicines are outlined above.   Orders Placed This Encounter  Procedures   EKG 12-Lead   No orders of the defined types were placed in this encounter.  Patient Instructions  Medication Instructions:  Your physician recommends that you continue on your current medications as directed. Please refer to the Current Medication list given to you today.   Labwork: NONE  Testing/Procedures: NONE  Follow-Up: AS NEEDED       I,Mathew Stumpf,acting as a Education administrator for [employer], MD.,have documented all relevant documentation on the behalf of Buford Dresser, MD,as directed by  Buford Dresser, MD while in the presence of Buford Dresser, MD.  I, Buford Dresser, MD, have reviewed all documentation for this visit. The documentation on 10/17/22 for the exam, diagnosis, procedures, and orders are all accurate and complete.   Signed, Buford Dresser, MD PhD 10/17/2022   Jordan Daugherty

## 2022-10-19 LAB — CUP PACEART REMOTE DEVICE CHECK
Battery Remaining Longevity: 78 mo
Battery Remaining Percentage: 100 %
Brady Statistic RA Percent Paced: 0 %
Brady Statistic RV Percent Paced: 100 %
Date Time Interrogation Session: 20240112001100
HighPow Impedance: 154 Ohm
Implantable Lead Connection Status: 753985
Implantable Lead Connection Status: 753985
Implantable Lead Connection Status: 753985
Implantable Lead Implant Date: 20110301
Implantable Lead Implant Date: 20110301
Implantable Lead Implant Date: 20110301
Implantable Lead Location: 753858
Implantable Lead Location: 753859
Implantable Lead Location: 753860
Implantable Lead Model: 185
Implantable Lead Model: 350
Implantable Lead Model: 4592
Implantable Lead Serial Number: 131585
Implantable Lead Serial Number: 28731977
Implantable Lead Serial Number: 327266
Implantable Pulse Generator Implant Date: 20180130
Lead Channel Impedance Value: 616 Ohm
Lead Channel Impedance Value: 642 Ohm
Lead Channel Impedance Value: 743 Ohm
Lead Channel Pacing Threshold Amplitude: 0.7 V
Lead Channel Pacing Threshold Amplitude: 0.7 V
Lead Channel Pacing Threshold Amplitude: 1.1 V
Lead Channel Pacing Threshold Pulse Width: 0.4 ms
Lead Channel Pacing Threshold Pulse Width: 0.4 ms
Lead Channel Pacing Threshold Pulse Width: 0.4 ms
Lead Channel Setting Pacing Amplitude: 1.1 V
Lead Channel Setting Pacing Amplitude: 2 V
Lead Channel Setting Pacing Amplitude: 2 V
Lead Channel Setting Pacing Pulse Width: 0.4 ms
Lead Channel Setting Pacing Pulse Width: 0.4 ms
Lead Channel Setting Sensing Sensitivity: 0.6 mV
Lead Channel Setting Sensing Sensitivity: 1 mV
Pulse Gen Serial Number: 123793

## 2022-10-22 ENCOUNTER — Ambulatory Visit (INDEPENDENT_AMBULATORY_CARE_PROVIDER_SITE_OTHER): Payer: 59

## 2022-10-22 DIAGNOSIS — I428 Other cardiomyopathies: Secondary | ICD-10-CM

## 2022-10-25 ENCOUNTER — Telehealth: Payer: Self-pay

## 2022-10-25 NOTE — Telephone Encounter (Signed)
Outreach made by scheduler.  See below.  Will forward to Dr. Quentin Ore to advise.  Called pt to schedule past due appt with CL. She said that she is moving to [place] at the end of March and wasn't going to schedule an appt here unless CL thought it was necessary. She said she didn't want to use her yearly f/u in [place] since she plans to establish when she moves.

## 2022-10-25 NOTE — Telephone Encounter (Signed)
-----  Message from Vickie Epley, MD sent at 10/20/2022  9:38 PM EST ----- Abnormal shock impedance.   Device clinic, please schedule with me in clinic for in-person device check and to discuss options.  Lysbeth Galas T. Quentin Ore, MD, [facility], [facility] Cardiac Electrophysiology

## 2022-10-26 NOTE — Telephone Encounter (Signed)
Per Dr. Quentin Ore:  I would like to at least meet with her virtually to discuss the results and emphasize the importance of getting early follow up in [place]. I know an EP doc there I can refer her to.  Schedule a video visit if she is willing.   Thanks,  Lars Mage

## 2022-11-13 ENCOUNTER — Ambulatory Visit
Admission: RE | Admit: 2022-11-13 | Discharge: 2022-11-13 | Disposition: A | Discharge status: Home / Self Care | Payer: 59 | Source: Ambulatory Visit | Attending: Obstetrics & Gynecology | Admitting: Obstetrics & Gynecology

## 2022-11-13 DIAGNOSIS — N6314 Unspecified lump in the right breast, lower inner quadrant: Secondary | ICD-10-CM

## 2022-11-16 ENCOUNTER — Encounter: Payer: Self-pay | Admitting: Cardiology

## 2022-11-16 ENCOUNTER — Ambulatory Visit: Payer: 59 | Attending: Cardiology | Admitting: Cardiology

## 2022-11-16 VITALS — BP 90/60 | HR 73 | Ht 65.0 in | Wt 230.6 lb

## 2022-11-16 DIAGNOSIS — I428 Other cardiomyopathies: Secondary | ICD-10-CM | POA: Diagnosis not present

## 2022-11-16 NOTE — Patient Instructions (Signed)
Medication Instructions:  Your physician recommends that you continue on your current medications as directed. Please refer to the Current Medication list given to you today.  *If you need a refill on your cardiac medications before your next appointment, please call your pharmacy*   Follow-Up: At [facility], you and your health needs are our priority.  As part of our continuing mission to provide you with exceptional heart care, we have created designated Provider Care Teams.  These Care Teams include your primary Cardiologist (physician) and Advanced Practice Providers (APPs -  Physician Assistants and Nurse Practitioners) who all work together to provide you with the care you need, when you need it.  Follow up with [facility] in [place]

## 2022-11-16 NOTE — Progress Notes (Signed)
Formatting of this note is different from the original.  Images from the original note were not included.    Electrophysiology Office Follow up Visit Note:      Date:  11/16/2022     ID:  Jillian Warren, DOB 1961-03-27, MRN [id number]    PCP:  Wenda Low, MD   [facility] Cardiologist:  Buford Dresser, MD   [facility] Electrophysiologist:  Vickie Epley, MD     Interval History:      Jillian Warren is a 62 y.o. female who presents for a follow up. 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 She previously would get her defibrillator checked with Thompson Grayer, MD for routine electrophysiology follow up.     Today, she is accompanied by a family member. She is feeling overall well.     She plans to move to [place] within the next month. She reports that Dr. Otilio Jefferson will be her new electrophysiologist in [place] at [facility].     We discussed her meeting with a EP in [place] to potentially test her defibrillator.     She denies any palpitations, chest pain, shortness of breath, or peripheral edema. No lightheadedness, headaches, syncope, orthopnea, or PND.    Past Medical History:   Diagnosis Date    Breast cancer ([place]) 2007    left    Chronic systolic dysfunction of left ventricle     Dyslipidemia     Family history of ovarian cancer     Family history of prostate cancer     Family history of rectal cancer     Family history of uterine cancer     Hypothyroidism     Morbid obesity with BMI of 40.0-44.9, adult ([place])     Nonischemic cardiomyopathy ([place])     OSA on CPAP     Personal history of radiation therapy 2007    left    Type II diabetes mellitus ([place])      Past Surgical History:   Procedure Laterality Date    BREAST BIOPSY Left 2007    BREAST LUMPECTOMY Left 2007    ICD IMPLANT  11/06/2016    [employer] Dynagen BiV ICD implanted for primary prevention    OOPHORECTOMY Right 05/2005    dermoid     Current Medications:  Current Meds    Medication Sig    acetaminophen (TYLENOL) 500 MG tablet Take 500 mg by mouth every 6 (six) hours as needed.    aspirin EC 81 MG tablet Take 81 mg by mouth daily.    atorvastatin (LIPITOR) 40 MG tablet Take 40 mg by mouth daily.    Calcium Acetate, Phos Binder, (CALCIUM ACETATE PO) Take 400 mg by mouth daily.    carvedilol (COREG) 25 MG tablet Take 25 mg by mouth 2 (two) times daily with a meal.    furosemide (LASIX) 40 MG tablet TAKE 1 TABLET BY MOUTH DAILY AS NEEDED    gabapentin (NEURONTIN) 300 MG capsule Take 900 mg by mouth 2 (two) times daily.    glucose blood (FREESTYLE LITE) test strip 1 each by Other route as needed for other. Use as instructed    JARDIANCE 25 MG TABS tablet Take 25 mg by mouth daily.    levothyroxine (SYNTHROID) 75 MCG tablet Take 75 mcg by mouth daily before breakfast.    metFORMIN (GLUCOPHAGE) 1000 MG tablet Take 1,000 mg by mouth 2 (two) times daily with a meal.    Multiple  Vitamin (MULTIVITAMIN) tablet Take 1 tablet by mouth daily.    potassium chloride SA (KLOR-CON M20) 20 MEQ tablet TAKE 1 TABLET (20 MEQ TOTAL) BY MOUTH DAILY AS NEEDED (WHEN YOU TAKE LASIX).    sacubitril-valsartan (ENTRESTO) 49-51 MG Take 1 tablet by mouth 2 (two) times daily.    spironolactone (ALDACTONE) 25 MG tablet Take 25 mg by mouth daily.       Allergies:   Pregabalin and Ramipril     Social History     Socioeconomic History    Marital status: Married     Spouse name: Not on file    Number of children: Not on file    Years of education: Not on file    Highest education level: Not on file   Occupational History    Not on file   Tobacco Use    Smoking status: Never    Smokeless tobacco: Never   Vaping Use    Vaping Use: Not on file   Substance and Sexual Activity    Alcohol use: Not Currently    Drug use: Never    Sexual activity: Yes     Birth control/protection: None   Other Topics Concern    Not on file   Social History Narrative    Lives in [place] from [place] Strain: Not on file   Food Insecurity: Not on file   Transportation Needs: Not on file   Physical Activity: Not on file   Stress: Not on file   Social Connections: Not on file       Family History:  The patient's family history includes Colon cancer in her father; Dementia in her father; Diabetes in her paternal grandfather; Heart disease in her paternal grandfather; Ovarian cancer (age of onset: 14) in an other family member; Prostate cancer (age of onset: 54) in her father; Rectal cancer in an other family member; Skin cancer (age of onset: 73) in her brother; Uterine cancer (age of onset: 41) in her paternal grandmother.    ROS:    Please see the history of present illness.     All other systems reviewed and are negative.    EKGs/Labs/Other Studies Reviewed:      The following studies were reviewed today:    11/16/2022 in clinic device interrogation personally reviewed  Longevity 6.5 years  HV impedance elevated at 158 ohms  Lead parameters otherwise stable  Impedance trend has shown a gradual increase in the HV impedance  100 biv paced    EKG:  The EKG ordered today demonstrates a sense, v paced rhythm    Recent Labs:  No results found for requested labs within last 365 days.     Recent Lipid Panel  No results found for: "CHOL", "TRIG", "HDL", "CHOLHDL", "VLDL", "LDLCALC", "LDLDIRECT"    Physical Exam:      VS:  BP 90/60   Pulse 73   Ht 5' 5"$  (1.651 m)   Wt 230 lb 9.6 oz (104.6 kg)   SpO2 96%   BMI 38.37 kg/m       Wt Readings from Last 3 Encounters:   11/16/22 230 lb 9.6 oz (104.6 kg)   10/17/22 231 lb 12.8 oz (105.1 kg)   04/27/22 240 lb (108.9 kg)       GEN: Well nourished, well developed in no acute distress  CARDIAC: RRR, no murmurs, rubs, gallops. Device pocket  well healed.  RESPIRATORY:  Clear to auscultation without rales, wheezing or rhonchi         ASSESSMENT:      1. Nonischemic cardiomyopathy (HCC)      PLAN:      In order of problems listed above:    #HFrEF  #CRT-D  in situ  #CRT-D lead abnormality  NYHA II. Warm and dry on exam. Last echo in 07/2020 showed EF 40%.   Continue current GDMT.    Her ICD lead has an elevated shock impedance. We discussed the pathophysiology of this abnormality during today's visit. I suspect her abnormality is due to encapsulation of the ICD shock coil within the RV. We discussed the potential implications of the elevated impedance including the possibility that it would fail in delivering ICD shocks. I discussed options from here including leaving the system in its current state without DFT given it is a primary prevention device and her LV EF has improved since first diagnosis.   Another strategy would be to test the ICD with a DFT. I discussed hte process of DFT testing with the patient and her family. She will reach out to the [facility] in [place], [place] to see if she can get into their clinic ASAP.    I did reach out to a colleague at [facility] EP to help facilitate her getting an appointment with their practice.    Total time spent with patient today 45a minutes. This includes reviewing records, evaluating the patient and coordinating care.     Medication Adjustments/Labs and Tests Ordered:  Current medicines are reviewed at length with the patient today.  Concerns regarding medicines are outlined above.     Orders Placed This Encounter   Procedures    EKG 12-Lead     No orders of the defined types were placed in this encounter.    I,Rachel Rivera,acting as a scribe for Vickie Epley, MD.,have documented all relevant documentation on the behalf of Vickie Epley, MD,as directed by  Vickie Epley, MD while in the presence of Vickie Epley, MD.    I, Vickie Epley, MD, have reviewed all documentation for this visit. The documentation on 11/16/22 for the exam, diagnosis, procedures, and orders are all accurate and complete.    Signed,  Lars Mage, MD, [facility], [facility]  11/16/2022 10:52 PM     Electrophysiology  [facility]    Electronically signed by Vickie Epley, MD at 11/16/2022 10:53 PM EST

## 2022-11-16 NOTE — Progress Notes (Signed)
Electrophysiology Office Follow up Visit Note:    Date:  11/16/2022   ID:  Jordan Daugherty, DOB March 10, 1961, MRN [id number]  PCP:  Wenda Low, MD  [facility] Cardiologist:  Buford Dresser, MD  [facility] Electrophysiologist:  Vickie Epley, MD    Interval History:    Jordan Daugherty is a 62 y.o. female who presents for a follow up. 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 She previously would get her defibrillator checked with Thompson Grayer, MD for routine electrophysiology follow up.   Today, she is accompanied by a family member. She is feeling overall well.   She plans to move to [place] within the next month. She reports that Dr. Otilio Jefferson will be her new electrophysiologist in [place] at [facility].   We discussed her meeting with a EP in [place] to potentially test her defibrillator.   She denies any palpitations, chest pain, shortness of breath, or peripheral edema. No lightheadedness, headaches, syncope, orthopnea, or PND.  Past Medical History:  Diagnosis Date   Breast cancer ([place]) 2007   left   Chronic systolic dysfunction of left ventricle    Dyslipidemia    Family history of ovarian cancer    Family history of prostate cancer    Family history of rectal cancer    Family history of uterine cancer    Hypothyroidism    Morbid obesity with BMI of 40.0-44.9, adult ([place])    Nonischemic cardiomyopathy ([place])    OSA on CPAP    Personal history of radiation therapy 2007   left   Type II diabetes mellitus ([place])     Past Surgical History:  Procedure Laterality Date   BREAST BIOPSY Left 2007   BREAST LUMPECTOMY Left 2007   ICD IMPLANT  11/06/2016   [employer] Dynagen BiV ICD implanted for primary prevention   OOPHORECTOMY Right 05/2005   dermoid    Current Medications: Current Meds  Medication Sig   acetaminophen (TYLENOL) 500 MG tablet Take 500 mg by mouth every 6 (six) hours as  needed.   aspirin EC 81 MG tablet Take 81 mg by mouth daily.   atorvastatin (LIPITOR) 40 MG tablet Take 40 mg by mouth daily.   Calcium Acetate, Phos Binder, (CALCIUM ACETATE PO) Take 400 mg by mouth daily.   carvedilol (COREG) 25 MG tablet Take 25 mg by mouth 2 (two) times daily with a meal.   furosemide (LASIX) 40 MG tablet TAKE 1 TABLET BY MOUTH DAILY AS NEEDED   gabapentin (NEURONTIN) 300 MG capsule Take 900 mg by mouth 2 (two) times daily.   glucose blood (FREESTYLE LITE) test strip 1 each by Other route as needed for other. Use as instructed   JARDIANCE 25 MG TABS tablet Take 25 mg by mouth daily.   levothyroxine (SYNTHROID) 75 MCG tablet Take 75 mcg by mouth daily before breakfast.   metFORMIN (GLUCOPHAGE) 1000 MG tablet Take 1,000 mg by mouth 2 (two) times daily with a meal.   Multiple Vitamin (MULTIVITAMIN) tablet Take 1 tablet by mouth daily.   potassium chloride SA (KLOR-CON M20) 20 MEQ tablet TAKE 1 TABLET (20 MEQ TOTAL) BY MOUTH DAILY AS NEEDED (WHEN YOU TAKE LASIX).   sacubitril-valsartan (ENTRESTO) 49-51 MG Take 1 tablet by mouth 2 (two) times daily.   spironolactone (ALDACTONE) 25 MG tablet Take 25 mg by mouth daily.     Allergies:   Pregabalin and Ramipril   Social History   Socioeconomic History  Marital status: Married    Spouse name: Not on file   Number of children: Not on file   Years of education: Not on file   Highest education level: Not on file  Occupational History   Not on file  Tobacco Use   Smoking status: Never   Smokeless tobacco: Never  Vaping Use   Vaping Use: Not on file  Substance and Sexual Activity   Alcohol use: Not Currently   Drug use: Never   Sexual activity: Yes    Birth control/protection: None  Other Topics Concern   Not on file  Social History Narrative   Lives in [place] from [place] Strain: Not on file  Food Insecurity: Not on file  Transportation Needs:  Not on file  Physical Activity: Not on file  Stress: Not on file  Social Connections: Not on file     Family History: The patient's family history includes Colon cancer in her father; Dementia in her father; Diabetes in her paternal grandfather; Heart disease in her paternal grandfather; Ovarian cancer (age of onset: 14) in an other family member; Prostate cancer (age of onset: 19) in her father; Rectal cancer in an other family member; Skin cancer (age of onset: 57) in her brother; Uterine cancer (age of onset: 59) in her paternal grandmother.  ROS:   Please see the history of present illness.    All other systems reviewed and are negative.  EKGs/Labs/Other Studies Reviewed:    The following studies were reviewed today:  11/16/2022 in clinic device interrogation personally reviewed Longevity 6.5 years HV impedance elevated at 158 ohms Lead parameters otherwise stable Impedance trend has shown a gradual increase in the HV impedance 100 biv paced   EKG:  The EKG ordered today demonstrates a sense, v paced rhythm  Recent Labs: No results found for requested labs within last 365 days.   Recent Lipid Panel No results found for: "CHOL", "TRIG", "HDL", "CHOLHDL", "VLDL", "LDLCALC", "LDLDIRECT"  Physical Exam:    VS:  BP 90/60   Pulse 73   Ht 5' 5"$  (1.651 m)   Wt 230 lb 9.6 oz (104.6 kg)   SpO2 96%   BMI 38.37 kg/m     Wt Readings from Last 3 Encounters:  11/16/22 230 lb 9.6 oz (104.6 kg)  10/17/22 231 lb 12.8 oz (105.1 kg)  04/27/22 240 lb (108.9 kg)     GEN: Well nourished, well developed in no acute distress CARDIAC: RRR, no murmurs, rubs, gallops. Device pocket well healed. RESPIRATORY:  Clear to auscultation without rales, wheezing or rhonchi        ASSESSMENT:    1. 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 Warm and dry on exam. Last echo in 07/2020 showed EF  40%.  Continue current GDMT.  Her ICD lead has an elevated shock impedance. We discussed the pathophysiology of this abnormality during today's visit. I suspect her abnormality is due to encapsulation of the ICD shock coil within the RV. We discussed the potential implications of the elevated impedance including the possibility that it would fail in delivering ICD shocks. I discussed options from here including leaving the system in its current state without DFT given it is a primary prevention device and her LV EF has improved since first diagnosis.  Another strategy would be to  test the ICD with a DFT. I discussed hte process of DFT testing with the patient and her family. She will reach out to the [facility] in [place], [place] to see if she can get into their clinic ASAP.  I did reach out to a colleague at [facility] EP to help facilitate her getting an appointment with their practice.   Total time spent with patient today 45a minutes. This includes reviewing records, evaluating the patient and coordinating care.   Medication Adjustments/Labs and Tests Ordered: Current medicines are reviewed at length with the patient today.  Concerns regarding medicines are outlined above.   Orders Placed This Encounter  Procedures   EKG 12-Lead   No orders of the defined types were placed in this encounter.   I,Rachel Rivera,acting as a scribe for Vickie Epley, MD.,have documented all relevant documentation on the behalf of Vickie Epley, MD,as directed by  Vickie Epley, MD while in the presence of Vickie Epley, MD.  I, Vickie Epley, MD, have reviewed all documentation for this visit. The documentation on 11/16/22 for the exam, diagnosis, procedures, and orders are all accurate and complete.   Signed, Lars Mage, MD, [facility], [facility] 11/16/2022 10:52 PM    Electrophysiology [facility]

## 2022-12-02 ENCOUNTER — Emergency Department (HOSPITAL_BASED_OUTPATIENT_CLINIC_OR_DEPARTMENT_OTHER): Payer: 59

## 2022-12-02 ENCOUNTER — Inpatient Hospital Stay (HOSPITAL_BASED_OUTPATIENT_CLINIC_OR_DEPARTMENT_OTHER)
Admission: EM | Admit: 2022-12-02 | Discharge: 2022-12-05 | DRG: 158 | Disposition: A | Discharge status: Home / Self Care | Payer: 59 | Attending: Family Medicine | Admitting: Family Medicine

## 2022-12-02 ENCOUNTER — Other Ambulatory Visit: Payer: Self-pay

## 2022-12-02 ENCOUNTER — Encounter (HOSPITAL_BASED_OUTPATIENT_CLINIC_OR_DEPARTMENT_OTHER): Payer: Self-pay | Admitting: Emergency Medicine

## 2022-12-02 DIAGNOSIS — Z8049 Family history of malignant neoplasm of other genital organs: Secondary | ICD-10-CM | POA: Diagnosis not present

## 2022-12-02 DIAGNOSIS — G620 Drug-induced polyneuropathy: Secondary | ICD-10-CM | POA: Diagnosis present

## 2022-12-02 DIAGNOSIS — K047 Periapical abscess without sinus: Secondary | ICD-10-CM | POA: Diagnosis present

## 2022-12-02 DIAGNOSIS — Z853 Personal history of malignant neoplasm of breast: Secondary | ICD-10-CM

## 2022-12-02 DIAGNOSIS — G4733 Obstructive sleep apnea (adult) (pediatric): Secondary | ICD-10-CM | POA: Diagnosis not present

## 2022-12-02 DIAGNOSIS — E1165 Type 2 diabetes mellitus with hyperglycemia: Secondary | ICD-10-CM | POA: Diagnosis present

## 2022-12-02 DIAGNOSIS — Z833 Family history of diabetes mellitus: Secondary | ICD-10-CM | POA: Diagnosis not present

## 2022-12-02 DIAGNOSIS — K122 Cellulitis and abscess of mouth: Secondary | ICD-10-CM | POA: Diagnosis present

## 2022-12-02 DIAGNOSIS — E1140 Type 2 diabetes mellitus with diabetic neuropathy, unspecified: Secondary | ICD-10-CM | POA: Diagnosis not present

## 2022-12-02 DIAGNOSIS — Z923 Personal history of irradiation: Secondary | ICD-10-CM | POA: Diagnosis not present

## 2022-12-02 DIAGNOSIS — I959 Hypotension, unspecified: Secondary | ICD-10-CM | POA: Diagnosis not present

## 2022-12-02 DIAGNOSIS — Z8042 Family history of malignant neoplasm of prostate: Secondary | ICD-10-CM | POA: Diagnosis not present

## 2022-12-02 DIAGNOSIS — I5042 Chronic combined systolic (congestive) and diastolic (congestive) heart failure: Secondary | ICD-10-CM | POA: Diagnosis not present

## 2022-12-02 DIAGNOSIS — Z8249 Family history of ischemic heart disease and other diseases of the circulatory system: Secondary | ICD-10-CM

## 2022-12-02 DIAGNOSIS — Z7982 Long term (current) use of aspirin: Secondary | ICD-10-CM

## 2022-12-02 DIAGNOSIS — Z888 Allergy status to other drugs, medicaments and biological substances status: Secondary | ICD-10-CM

## 2022-12-02 DIAGNOSIS — L0291 Cutaneous abscess, unspecified: Secondary | ICD-10-CM

## 2022-12-02 DIAGNOSIS — Z7984 Long term (current) use of oral hypoglycemic drugs: Secondary | ICD-10-CM | POA: Diagnosis not present

## 2022-12-02 DIAGNOSIS — Z808 Family history of malignant neoplasm of other organs or systems: Secondary | ICD-10-CM | POA: Diagnosis not present

## 2022-12-02 DIAGNOSIS — E039 Hypothyroidism, unspecified: Secondary | ICD-10-CM | POA: Diagnosis not present

## 2022-12-02 DIAGNOSIS — I110 Hypertensive heart disease with heart failure: Secondary | ICD-10-CM | POA: Diagnosis not present

## 2022-12-02 DIAGNOSIS — E785 Hyperlipidemia, unspecified: Secondary | ICD-10-CM | POA: Diagnosis present

## 2022-12-02 DIAGNOSIS — Z95810 Presence of automatic (implantable) cardiac defibrillator: Secondary | ICD-10-CM | POA: Diagnosis not present

## 2022-12-02 DIAGNOSIS — Z8041 Family history of malignant neoplasm of ovary: Secondary | ICD-10-CM | POA: Diagnosis not present

## 2022-12-02 DIAGNOSIS — Z800 Family history of malignant neoplasm of digestive organs: Secondary | ICD-10-CM

## 2022-12-02 DIAGNOSIS — T451X5A Adverse effect of antineoplastic and immunosuppressive drugs, initial encounter: Secondary | ICD-10-CM | POA: Diagnosis not present

## 2022-12-02 DIAGNOSIS — Z79899 Other long term (current) drug therapy: Secondary | ICD-10-CM

## 2022-12-02 LAB — CBC WITH DIFFERENTIAL/PLATELET
Abs Immature Granulocytes: 0.03 10*3/uL (ref 0.00–0.07)
Basophils Absolute: 0 10*3/uL (ref 0.0–0.1)
Basophils Relative: 0 %
Eosinophils Absolute: 0 10*3/uL (ref 0.0–0.5)
Eosinophils Relative: 0 %
HCT: 43.1 % (ref 36.0–46.0)
Hemoglobin: 14.5 g/dL (ref 12.0–15.0)
Immature Granulocytes: 0 %
Lymphocytes Relative: 16 %
Lymphs Abs: 1.5 10*3/uL (ref 0.7–4.0)
MCH: 29.6 pg (ref 26.0–34.0)
MCHC: 33.6 g/dL (ref 30.0–36.0)
MCV: 88 fL (ref 80.0–100.0)
Monocytes Absolute: 1.2 10*3/uL — ABNORMAL HIGH (ref 0.1–1.0)
Monocytes Relative: 12 %
Neutro Abs: 6.6 10*3/uL (ref 1.7–7.7)
Neutrophils Relative %: 72 %
Platelets: 265 10*3/uL (ref 150–400)
RBC: 4.9 MIL/uL (ref 3.87–5.11)
RDW: 13.4 % (ref 11.5–15.5)
WBC: 9.3 10*3/uL (ref 4.0–10.5)
nRBC: 0 % (ref 0.0–0.2)

## 2022-12-02 LAB — BASIC METABOLIC PANEL
Anion gap: 12 (ref 5–15)
BUN: 14 mg/dL (ref 8–23)
CO2: 25 mmol/L (ref 22–32)
Calcium: 10.3 mg/dL (ref 8.9–10.3)
Chloride: 98 mmol/L (ref 98–111)
Creatinine, Ser: 0.68 mg/dL (ref 0.44–1.00)
GFR, Estimated: >60 mL/min (ref >60)
Glucose, Bld: 155 mg/dL — ABNORMAL HIGH (ref 70–99)
Potassium: 3.9 mmol/L (ref 3.5–5.1)
Sodium: 135 mmol/L (ref 135–145)

## 2022-12-02 LAB — LACTIC ACID, PLASMA: Lactic Acid, Venous: 1.3 mmol/L (ref 0.5–1.9)

## 2022-12-02 MED ORDER — IOHEXOL 300 MG/ML  SOLN
100.0000 mL | Freq: Once | INTRAMUSCULAR | Status: AC | PRN
  Administered 2022-12-02: 75 mL via INTRAVENOUS

## 2022-12-02 MED ORDER — SODIUM CHLORIDE 0.9 % IV SOLN
3.0000 g | Freq: Once | INTRAVENOUS | Status: AC
  Administered 2022-12-02: 3 g via INTRAVENOUS

## 2022-12-02 MED ORDER — OXYCODONE-ACETAMINOPHEN 5-325 MG PO TABS
1.0000 | ORAL_TABLET | Freq: Once | ORAL | Status: AC
  Administered 2022-12-02: 1 via ORAL
  Filled 2022-12-02: qty 1

## 2022-12-02 MED ORDER — FENTANYL CITRATE PF 50 MCG/ML IJ SOSY
12.5000 ug | PREFILLED_SYRINGE | Freq: Once | INTRAMUSCULAR | Status: AC
  Administered 2022-12-02: 12.5 ug via INTRAVENOUS
  Filled 2022-12-02: qty 1

## 2022-12-02 NOTE — ED Triage Notes (Signed)
Thursday tooth ache,18 /19(left),Friday worse,painful, went to dentist on Saturday, xray showed abscess, started on amox, and flagyl,oxycontin for pain. This am had fever 100 and left side of jaw/lower face swollen and is feeling like its hard to swallow.

## 2022-12-02 NOTE — ED Provider Notes (Incomplete)
[facility] Provider Note   CSN: [id number] Arrival date & time: 12/02/22  1214     History {Add pertinent medical, surgical, social history, OB history to HPI:1} No chief complaint on file.   Jordan Daugherty is a 61 y.o. female.  HPI   62 year old female presents emergency department with complaints of dental pain.  Patient reports history of left lower tooth pain since Friday of this past week.  Patient reports going to the dentist yesterday and was prescribed amoxicillin as well as Flagyl for dental abscess.  Patient reports worsening of swelling, pain with opening her jaw since being seen by dentist yesterday.  Antibiotics twice yesterday has not taken them today.  Denies difficulty breathing, difficulty maintaining oral secretions.  Reports oral temperature taken at home of 100 F orally.  Denies chest pain, shortness of breath, abdominal pain, nausea, vomiting.  Has planned procedure with dentist via root canal.  Past medical history significant for diabetes mellitus type 2, nonischemic cardiomyopathy, breast cancer, hypothyroidism  Home Medications Prior to Admission medications   Medication Sig Start Date End Date Taking? Authorizing Provider  acetaminophen (TYLENOL) 500 MG tablet Take 500 mg by mouth every 6 (six) hours as needed.    [provider]  aspirin EC 81 MG tablet Take 81 mg by mouth daily.    [provider]  atorvastatin (LIPITOR) 40 MG tablet Take 40 mg by mouth daily.    [provider]  Calcium Acetate, Phos Binder, (CALCIUM ACETATE PO) Take 400 mg by mouth daily.    [provider]  carvedilol (COREG) 25 MG tablet Take 25 mg by mouth 2 (two) times daily with a meal.    [provider]  furosemide (LASIX) 40 MG tablet TAKE 1 TABLET BY MOUTH DAILY AS NEEDED 08/13/22   Buford Dresser, MD  gabapentin (NEURONTIN) 300 MG capsule Take 900 mg by mouth 2 (two) times daily.     [provider]  glucose blood (FREESTYLE LITE) test strip 1 each by Other route as needed for other. Use as instructed    [provider]  JARDIANCE 25 MG TABS tablet Take 25 mg by mouth daily. 02/24/20   [provider]  levothyroxine (SYNTHROID) 75 MCG tablet Take 75 mcg by mouth daily before breakfast.    [provider]  metFORMIN (GLUCOPHAGE) 1000 MG tablet Take 1,000 mg by mouth 2 (two) times daily with a meal.    [provider]  Multiple Vitamin (MULTIVITAMIN) tablet Take 1 tablet by mouth daily.    [provider]  potassium chloride SA (KLOR-CON M20) 20 MEQ tablet TAKE 1 TABLET (20 MEQ TOTAL) BY MOUTH DAILY AS NEEDED (WHEN YOU TAKE LASIX). 07/23/22   Buford Dresser, MD  sacubitril-valsartan (ENTRESTO) 49-51 MG Take 1 tablet by mouth 2 (two) times daily.    [provider]  spironolactone (ALDACTONE) 25 MG tablet Take 25 mg by mouth daily.    [provider]      Allergies    Pregabalin and Ramipril    Review of Systems   Review of Systems  All other systems reviewed and are negative.   Physical Exam Updated Vital Signs BP 108/79   Pulse (!) 107   Temp 98.7 F (37.1 C) (Oral)   Resp 20   SpO2 98%  Physical Exam Vitals and nursing note reviewed.  Constitutional:      General: She is not in acute distress.    Appearance:  She is well-developed.  HENT:     Head: Normocephalic and atraumatic.     Mouth/Throat:      Comments: No posterior pharyngeal erythema noted.  Uvula midline rise symmetric with phonation.  Tender to palpation left lower gumline with obvious swelling.  No obvious palpable fluctuance.  Patient with no obvious sublingual swelling or submandibular swelling on the left side appreciated which is also tender to touch.   Eyes:     Conjunctiva/sclera: Conjunctivae normal.  Neck:     Comments: Left-sided submandibular swelling appreciated as well as left lateral along the mandible of  affected tooth. Cardiovascular:     Rate and Rhythm: Normal rate and regular rhythm.     Heart sounds: No murmur heard. Pulmonary:     Effort: Pulmonary effort is normal. No respiratory distress.     Breath sounds: Normal breath sounds.  Abdominal:     Palpations: Abdomen is soft.     Tenderness: There is no abdominal tenderness.  Musculoskeletal:        General: No swelling.     Cervical back: Neck supple.  Skin:    General: Skin is warm and dry.     Capillary Refill: Capillary refill takes less than 2 seconds.  Neurological:     Mental Status: She is alert.  Psychiatric:        Mood and Affect: Mood normal.     ED Results / Procedures / Treatments   Labs (all labs ordered are listed, but only abnormal results are displayed) Labs Reviewed  CBC WITH DIFFERENTIAL/PLATELET  BASIC METABOLIC PANEL    EKG None  Radiology No results found.  Procedures Procedures  {Document cardiac monitor, telemetry assessment procedure when appropriate:1}  Medications Ordered in ED Medications - No data to display  ED Course/ Medical Decision Making/ A&P Clinical Course as of 12/02/22 Pricilla Riffle Dec 02, 2022  1818 Consulted ENT Dr. Janace Hoard regarding the patient.  He recommended reaching out to [facility] given lack of oral surgery and cone.  To page [facility]. [CR]  [id number] Talk to Dr. Harvin Hazel of ENT at [facility].  He independently evaluated CT imaging and recommended trial of IV antibiotics given overall well-appearing nature of patient and character/location of abscess.  Recommended admission to [facility] for IV antibiotics with repaged if patient acutely worsens. [CR]    Clinical Course User Index [CR] Wilnette Kales, PA   {   Click here for ABCD2, HEART and other calculatorsREFRESH Note before signing :1}                          Medical Decision Making Amount and/or Complexity of Data Reviewed Labs: ordered. Radiology: ordered.  Risk Prescription drug  management. Decision regarding hospitalization.   This patient presents to the ED for concern of dental pain/swelling, this involves an extensive number of treatment options, and is a complaint that carries with it a high risk of complications and morbidity.  The differential diagnosis includes cellulitis, erysipelas, periapical abscess, peritonsillar abscess, Ludwig angina, anaphylaxis, Lemierre's disease, necrotizing ulcerative gingivitis   Co morbidities that complicate the patient evaluation  See HPI   Additional history obtained:  Additional history obtained from EMR External records from outside source obtained and reviewed including hospital records   Lab Tests:  I Ordered, and personally interpreted labs.  The pertinent results include: No leukocytosis noted.  No evidence of anemia.  Platelets within normal range.  No electrolyte abnormality appreciated.  No renal dysfunction.  Initial lactic of 1.3.   Imaging Studies ordered:  I ordered imaging studies including CT soft tissue neck I independently visualized and interpreted imaging which showed elongated odontogenic soft tissue abscess extending well left mandibular body approximately 0.7 x 0.7 x 4 cm.  Edema involving tongue especially base of tongue. I agree with the radiologist interpretation  Cardiac Monitoring: / EKG:  The patient was maintained on a cardiac monitor.  I personally viewed and interpreted the cardiac monitored which showed an underlying rhythm of: Sinus rhythm   Consultations Obtained:  See ED course  Problem List / ED Course / Critical interventions / Medication management  Ludwig angina I ordered medication including Unasyn, Percocet, fentanyl   Reevaluation of the patient after these medicines showed that the patient improved I have reviewed the patients home medicines and have made adjustments as needed   Social Determinants of Health:  Denies tobacco or illicit drug use   Test /  Admission - Considered:  Ludwig angina Vitals signs  within normal range and stable throughout visit. Laboratory/imaging studies significant for: See above *** Worrisome signs and symptoms were discussed with the patient, and the patient acknowledged understanding to return to the ED if noticed. Patient was stable upon discharge.    {Document critical care time when appropriate:1} {Document review of labs and clinical decision tools ie heart score, Chads2Vasc2 etc:1}  {Document your independent review of radiology images, and any outside records:1} {Document your discussion with family members, caretakers, and with consultants:1} {Document social determinants of health affecting pt's care:1} {Document your decision making why or why not admission, treatments were needed:1} Final Clinical Impression(s) / ED Diagnoses Final diagnoses:  None    Rx / DC Orders ED Discharge Orders     None

## 2022-12-02 NOTE — ED Provider Notes (Incomplete)
11:14 PM Patient has arrived from [facility] for assessment by hospitalist.  She has had a CT scan that shows findings consistent with Ludwig's angina.  Given Unasyn prior to transfer.  She reports that she is on 3 L oxygen via nasal cannula at nighttime.  She is presently satting 94% on 2 L.  She does not have any shortness of breath.  She is tolerating her secretions with normal phonation.  There is some edema to the sublingual space as well as edema and erythema to the left lower jaw extending down the left side of the neck.  Patient, overall, appears nontoxic.  Consult placed to [facility].  Prior providers consulted with Dr. Posey Pronto of ENT at [facility] who reviewed CT imaging and felt that patient's imaging and work up were stable for admission at our facility and initiation of IV abx; would consider transfer if patient deteriorated. No OMFS on call at [facility]. [facility] was also consulted without any beds available for transfer (only accepting Cardiac patients).  11:42 PM Consult placed to Dr. Susa Simmonds of [facility] who will assess patient in the ED.  1:47 AM Had a long discussion with patient and husband regarding inability for transfer to tertiary care facility as both [facility] have declined transfer for admission at this time.  While patient and husband continue to have concerns about remaining at this facility should her course progress, they are amenable to admission at this time.  I did speak with Dr. Patsey Berthold of critical care regarding consultation on the patient.  She will see the patient in the ED as well.    Antonietta Breach, PA-C 12/03/22 0149    Antonietta Breach, PA-C 12/03/22 [id number]    Merryl Hacker, MD 12/04/22 [phone]

## 2022-12-02 NOTE — Progress Notes (Signed)
   12/02/22 2304  Spiritual Encounters  Type of Visit Initial  Care provided to: Patient  Referral source Patient request  Reason for visit Routine spiritual support  OnCall Visit Yes  Spiritual Framework  Presenting Themes Rituals and practive  Values/beliefs Christian  Community/Connection Family  Patient Stress Factors None identified  Family Stress Factors None identified  Interventions  Spiritual Care Interventions Made Prayer   Prayed with patient, provided emotional support and comforting conversation

## 2022-12-02 NOTE — ED Notes (Signed)
[facility] was called for Xfr to [facility]. Dx. Ludwigs Angina. DR from [facility] will contact PA-Robbins.

## 2022-12-02 NOTE — ED Provider Notes (Signed)
Patient was handed off by Unk Lightning, PA-C.  Plan at this time is to reconsult with coming from patient admission after consulted with Dr. Posey Pronto at [facility] who recommended trial of IV antibiotics.  The patient acutely worsens, plan is to recontact Dr. Posey Pronto for transfer to [facility]. Physical Exam  BP (!) 94/56   Pulse 95   Temp 98.6 F (37 C) (Oral)   Resp 20   SpO2 95%   Physical Exam  Procedures  Procedures  ED Course / MDM   Clinical Course as of 12/02/22 1925  Nancy Fetter Dec 02, 2022  1818 Consulted ENT Dr. Janace Hoard regarding the patient.  He recommended reaching out to [facility] given lack of oral surgery and cone.  To page [facility]. [CR]  [id number] Talk to Dr. Harvin Hazel of ENT at [facility].  He independently evaluated CT imaging and recommended trial of IV antibiotics given overall well-appearing nature of patient and character/location of abscess.  Recommended admission to [facility] for IV antibiotics with repaged if patient acutely worsens. [CR]    Clinical Course User Index [CR] Wilnette Kales, PA   Medical Decision Making Amount and/or Complexity of Data Reviewed Labs: ordered. Radiology: ordered.  Risk Prescription drug management. Decision regarding hospitalization.   Spoke with Dr. Nevada Crane, hospitalist, regarding admission of patient after Bufford Spikes spoke with Dr. Posey Pronto, ENT with [facility] and Dr. Janace Hoard, ENT with [facility]. Dr. Nevada Crane

## 2022-12-03 ENCOUNTER — Encounter (HOSPITAL_COMMUNITY): Payer: Self-pay | Admitting: Family Medicine

## 2022-12-03 DIAGNOSIS — Z95810 Presence of automatic (implantable) cardiac defibrillator: Secondary | ICD-10-CM | POA: Diagnosis not present

## 2022-12-03 DIAGNOSIS — I110 Hypertensive heart disease with heart failure: Secondary | ICD-10-CM | POA: Diagnosis present

## 2022-12-03 DIAGNOSIS — Z833 Family history of diabetes mellitus: Secondary | ICD-10-CM | POA: Diagnosis not present

## 2022-12-03 DIAGNOSIS — Z923 Personal history of irradiation: Secondary | ICD-10-CM | POA: Diagnosis not present

## 2022-12-03 DIAGNOSIS — K122 Cellulitis and abscess of mouth: Secondary | ICD-10-CM | POA: Diagnosis present

## 2022-12-03 DIAGNOSIS — E1165 Type 2 diabetes mellitus with hyperglycemia: Secondary | ICD-10-CM | POA: Diagnosis present

## 2022-12-03 DIAGNOSIS — Z8042 Family history of malignant neoplasm of prostate: Secondary | ICD-10-CM | POA: Diagnosis not present

## 2022-12-03 DIAGNOSIS — E785 Hyperlipidemia, unspecified: Secondary | ICD-10-CM | POA: Diagnosis present

## 2022-12-03 DIAGNOSIS — G4733 Obstructive sleep apnea (adult) (pediatric): Secondary | ICD-10-CM | POA: Diagnosis present

## 2022-12-03 DIAGNOSIS — G620 Drug-induced polyneuropathy: Secondary | ICD-10-CM | POA: Diagnosis present

## 2022-12-03 DIAGNOSIS — K047 Periapical abscess without sinus: Secondary | ICD-10-CM | POA: Diagnosis present

## 2022-12-03 DIAGNOSIS — Z8049 Family history of malignant neoplasm of other genital organs: Secondary | ICD-10-CM | POA: Diagnosis not present

## 2022-12-03 DIAGNOSIS — Z800 Family history of malignant neoplasm of digestive organs: Secondary | ICD-10-CM | POA: Diagnosis not present

## 2022-12-03 DIAGNOSIS — E039 Hypothyroidism, unspecified: Secondary | ICD-10-CM | POA: Diagnosis present

## 2022-12-03 DIAGNOSIS — E1140 Type 2 diabetes mellitus with diabetic neuropathy, unspecified: Secondary | ICD-10-CM | POA: Diagnosis present

## 2022-12-03 DIAGNOSIS — Z8041 Family history of malignant neoplasm of ovary: Secondary | ICD-10-CM | POA: Diagnosis not present

## 2022-12-03 DIAGNOSIS — I959 Hypotension, unspecified: Secondary | ICD-10-CM | POA: Diagnosis not present

## 2022-12-03 DIAGNOSIS — T451X5A Adverse effect of antineoplastic and immunosuppressive drugs, initial encounter: Secondary | ICD-10-CM | POA: Diagnosis present

## 2022-12-03 DIAGNOSIS — Z853 Personal history of malignant neoplasm of breast: Secondary | ICD-10-CM | POA: Diagnosis not present

## 2022-12-03 DIAGNOSIS — Z8249 Family history of ischemic heart disease and other diseases of the circulatory system: Secondary | ICD-10-CM | POA: Diagnosis not present

## 2022-12-03 DIAGNOSIS — Z888 Allergy status to other drugs, medicaments and biological substances status: Secondary | ICD-10-CM | POA: Diagnosis not present

## 2022-12-03 DIAGNOSIS — Z808 Family history of malignant neoplasm of other organs or systems: Secondary | ICD-10-CM | POA: Diagnosis not present

## 2022-12-03 DIAGNOSIS — Z7984 Long term (current) use of oral hypoglycemic drugs: Secondary | ICD-10-CM | POA: Diagnosis not present

## 2022-12-03 DIAGNOSIS — I5042 Chronic combined systolic (congestive) and diastolic (congestive) heart failure: Secondary | ICD-10-CM | POA: Diagnosis present

## 2022-12-03 LAB — GLUCOSE, CAPILLARY
Glucose-Capillary: 129 mg/dL — ABNORMAL HIGH (ref 70–99)
Glucose-Capillary: 125 mg/dL — ABNORMAL HIGH (ref 70–99)

## 2022-12-03 LAB — CBC
HCT: 41.5 % (ref 36.0–46.0)
Hemoglobin: 14 g/dL (ref 12.0–15.0)
MCH: 30.2 pg (ref 26.0–34.0)
MCHC: 33.7 g/dL (ref 30.0–36.0)
MCV: 89.4 fL (ref 80.0–100.0)
Platelets: 248 10*3/uL (ref 150–400)
RBC: 4.64 MIL/uL (ref 3.87–5.11)
RDW: 13.5 % (ref 11.5–15.5)
WBC: 9 10*3/uL (ref 4.0–10.5)
nRBC: 0 % (ref 0.0–0.2)

## 2022-12-03 LAB — COMPREHENSIVE METABOLIC PANEL
ALT: 23 U/L (ref 0–44)
AST: 25 U/L (ref 15–41)
Albumin: 3.7 g/dL (ref 3.5–5.0)
Alkaline Phosphatase: 58 U/L (ref 38–126)
Anion gap: 12 (ref 5–15)
BUN: 13 mg/dL (ref 8–23)
CO2: 23 mmol/L (ref 22–32)
Calcium: 9.2 mg/dL (ref 8.9–10.3)
Chloride: 100 mmol/L (ref 98–111)
Creatinine, Ser: 0.82 mg/dL (ref 0.44–1.00)
GFR, Estimated: >60 mL/min (ref >60)
Glucose, Bld: 118 mg/dL — ABNORMAL HIGH (ref 70–99)
Potassium: 4.1 mmol/L (ref 3.5–5.1)
Sodium: 135 mmol/L (ref 135–145)
Total Bilirubin: 2.5 mg/dL — ABNORMAL HIGH (ref 0.3–1.2)
Total Protein: 7.2 g/dL (ref 6.5–8.1)

## 2022-12-03 LAB — CBG MONITORING, ED
Glucose-Capillary: 207 mg/dL — ABNORMAL HIGH (ref 70–99)
Glucose-Capillary: 180 mg/dL — ABNORMAL HIGH (ref 70–99)

## 2022-12-03 LAB — HIV ANTIBODY (ROUTINE TESTING W REFLEX): HIV Screen 4th Generation wRfx: NONREACTIVE

## 2022-12-03 MED ORDER — SODIUM CHLORIDE 0.9 % IV SOLN
3.0000 g | Freq: Four times a day (QID) | INTRAVENOUS | Status: DC
  Administered 2022-12-03 – 2022-12-05 (×11): 3 g via INTRAVENOUS
  Filled 2022-12-03 (×11): qty 8

## 2022-12-03 MED ORDER — ACETAMINOPHEN 325 MG PO TABS: 650.0000 mg | ORAL_TABLET | Freq: Four times a day (QID) | ORAL | Status: DC | PRN

## 2022-12-03 MED ORDER — GUAIFENESIN 100 MG/5ML PO LIQD
5.0000 mL | ORAL | Status: DC | PRN
  Filled 2022-12-03: qty 5

## 2022-12-03 MED ORDER — ATORVASTATIN CALCIUM 40 MG PO TABS
40.0000 mg | ORAL_TABLET | Freq: Every evening | ORAL | Status: DC
  Administered 2022-12-03 – 2022-12-05 (×3): 40 mg via ORAL
  Filled 2022-12-03 (×3): qty 1

## 2022-12-03 MED ORDER — LEVOTHYROXINE SODIUM 75 MCG PO TABS
75.0000 ug | ORAL_TABLET | Freq: Every day | ORAL | Status: DC
  Administered 2022-12-03 – 2022-12-05 (×3): 75 ug via ORAL
  Filled 2022-12-03 (×3): qty 1

## 2022-12-03 MED ORDER — ACETAMINOPHEN 500 MG PO TABS
500.0000 mg | ORAL_TABLET | Freq: Three times a day (TID) | ORAL | Status: DC
  Administered 2022-12-03 – 2022-12-05 (×9): 500 mg via ORAL
  Filled 2022-12-03 (×9): qty 1

## 2022-12-03 MED ORDER — INSULIN ASPART 100 UNIT/ML IJ SOLN
0.0000 [IU] | Freq: Three times a day (TID) | INTRAMUSCULAR | Status: DC
  Administered 2022-12-04: 3 [IU] via SUBCUTANEOUS

## 2022-12-03 MED ORDER — SODIUM CHLORIDE 0.9 % IV SOLN: INTRAVENOUS | Status: AC

## 2022-12-03 MED ORDER — SODIUM CHLORIDE 0.9 % IV BOLUS
500.0000 mL | Freq: Once | INTRAVENOUS | Status: AC
  Administered 2022-12-03: 500 mL via INTRAVENOUS

## 2022-12-03 MED ORDER — SACUBITRIL-VALSARTAN 49-51 MG PO TABS
1.0000 | ORAL_TABLET | Freq: Two times a day (BID) | ORAL | Status: DC
  Administered 2022-12-03 – 2022-12-05 (×3): 1 via ORAL
  Filled 2022-12-03 (×6): qty 1

## 2022-12-03 MED ORDER — GABAPENTIN 300 MG PO CAPS
600.0000 mg | ORAL_CAPSULE | Freq: Three times a day (TID) | ORAL | Status: DC
  Administered 2022-12-03 – 2022-12-05 (×8): 600 mg via ORAL
  Filled 2022-12-03 (×8): qty 2

## 2022-12-03 MED ORDER — IPRATROPIUM-ALBUTEROL 0.5-2.5 (3) MG/3ML IN SOLN: 3.0000 mL | RESPIRATORY_TRACT | Status: DC | PRN

## 2022-12-03 MED ORDER — POLYETHYLENE GLYCOL 3350 17 G PO PACK: 17.0000 g | PACK | Freq: Every day | ORAL | Status: DC | PRN

## 2022-12-03 MED ORDER — HYDRALAZINE HCL 20 MG/ML IJ SOLN: 10.0000 mg | INTRAMUSCULAR | Status: DC | PRN

## 2022-12-03 MED ORDER — MIDODRINE HCL 5 MG PO TABS
5.0000 mg | ORAL_TABLET | Freq: Three times a day (TID) | ORAL | Status: DC
  Administered 2022-12-03 – 2022-12-05 (×8): 5 mg via ORAL
  Filled 2022-12-03 (×8): qty 1

## 2022-12-03 MED ORDER — TRAZODONE HCL 50 MG PO TABS
50.0000 mg | ORAL_TABLET | Freq: Every evening | ORAL | Status: DC | PRN
  Administered 2022-12-04: 50 mg via ORAL
  Filled 2022-12-03: qty 1

## 2022-12-03 MED ORDER — OXYCODONE HCL 5 MG PO TABS
5.0000 mg | ORAL_TABLET | ORAL | Status: DC | PRN
  Administered 2022-12-04 – 2022-12-05 (×6): 5 mg via ORAL
  Filled 2022-12-03 (×6): qty 1

## 2022-12-03 MED ORDER — OXYCODONE-ACETAMINOPHEN 5-325 MG PO TABS
1.0000 | ORAL_TABLET | Freq: Four times a day (QID) | ORAL | Status: DC | PRN
  Administered 2022-12-03 (×2): 1 via ORAL
  Filled 2022-12-03 (×2): qty 1

## 2022-12-03 MED ORDER — CARVEDILOL 12.5 MG PO TABS
25.0000 mg | ORAL_TABLET | Freq: Two times a day (BID) | ORAL | Status: DC
  Administered 2022-12-03: 25 mg via ORAL
  Filled 2022-12-03: qty 2

## 2022-12-03 MED ORDER — ONDANSETRON HCL 4 MG/2ML IJ SOLN: 4.0000 mg | Freq: Four times a day (QID) | INTRAMUSCULAR | Status: DC | PRN

## 2022-12-03 MED ORDER — ONDANSETRON HCL 4 MG PO TABS: 4.0000 mg | ORAL_TABLET | Freq: Four times a day (QID) | ORAL | Status: DC | PRN

## 2022-12-03 MED ORDER — SPIRONOLACTONE 12.5 MG HALF TABLET: 25.0000 mg | ORAL_TABLET | Freq: Every evening | ORAL | Status: DC

## 2022-12-03 MED ORDER — SENNOSIDES-DOCUSATE SODIUM 8.6-50 MG PO TABS: 1.0000 | ORAL_TABLET | Freq: Every evening | ORAL | Status: DC | PRN

## 2022-12-03 MED ORDER — ENOXAPARIN SODIUM 40 MG/0.4ML IJ SOSY
40.0000 mg | PREFILLED_SYRINGE | INTRAMUSCULAR | Status: DC
  Filled 2022-12-03: qty 0.4

## 2022-12-03 MED ORDER — METOPROLOL TARTRATE 5 MG/5ML IV SOLN: 5.0000 mg | INTRAVENOUS | Status: DC | PRN

## 2022-12-03 NOTE — Progress Notes (Addendum)
PROGRESS NOTE    Jordan Daugherty  [id number] DOB: 01-30-1961 DOA: 12/02/2022 PCP: Wenda Low, MD   Brief Narrative:   62 y.o. 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  Husband states she went to the dentist on Saturday and was given antibiotics (amoxicillin and Flagyl) which she has been taking.  Today she woke up with a fever 100 F, left jaw pain swelling and redness.  Felt it was hard to swallow and had difficulty opening her mouth.  She was advised by her primary care provider to go to the ED for evaluation.  Upon admission EDP consulted ENT who initially recommended transferring patient to tertiary care but after reviewing the scan they recommended trial of IV antibiotics and patient was admitted to the hospital.  Patient was also seen by pulmonary team.   Assessment & Plan:  Principal Problem:   Ludwig's angina Active Problems:   ICD (implantable cardioverter-defibrillator) in place   Chronic combined systolic and diastolic heart failure ([place])   Type 2 diabetes mellitus with hyperglycemia, without long-term current use of insulin (HCC)   Hypothyroidism, unspecified   Neuropathy due to chemotherapeutic drug (HCC)   OSA on CPAP     Ludwig Angina  CT soft tissue neck with evidence of ludwig angina and airway narrowing.  Dr. Janace Hoard, ENT was consulted by ED provider who preferred a tertiary care transfer however [facility] and [facility] did not have any beds.  As she is protecting her airway here and is well-appearing ENT at [facility] recommended trial of IV fluids and is likely not a surgical candidate.  She uses 2 to 3 L of oxygen at night.  The external  swelling is not increasing (per husband).  Advised to reassess need for transfer if patient acutely declines. - Patient was also seen by PCCM who is recommending IV antibiotics and conservative management for now.  Discussed with Dr Jodene Nam, who will see the patient.    Combined diastolic and systolic congestive heart failure Echo from October 2021 showed LVEF 40 to 45% with grade 1 diastolic dysfunction, regional wall motion abnormality and global hypokinesis.  - home Entresto, carvedilol and spironolactone -> will hold due to soft BP, Lipitor   Hyperlipidemia Lipitor   Hypothyroidism Synthroid   OSA Bedtime CPAP   Neuropathy - Continue home gabapentin  DVT prophylaxis: Lovenox Code Status: Full code Family Communication:  Spouse at bedside  Status is: Inpatient Remains inpatient appropriate because: Cont hospi stay for IV Abx.       Subjective: Swelling, pain and erythema improved. Remains afebrile.  Still having some difficulty opening her mouth.   Examination:  General exam: Appears calm and comfortable. Left sided facial swelling improved. Minimal to no erythema.  Respiratory system: Clear to auscultation. Respiratory effort normal. Cardiovascular system: S1 & S2 heard, RRR. No JVD, murmurs, rubs, gallops or clicks. No pedal edema. Gastrointestinal system: Abdomen is nondistended, soft and nontender. No organomegaly or masses felt. Normal bowel sounds heard. Central nervous system: Alert and oriented. No focal neurological deficits. Extremities: Symmetric 5 x 5 power. Skin: No rashes, lesions or ulcers Psychiatry: Judgement and insight appear normal. Mood & affect appropriate.     Objective: Vitals:   12/03/22 0331 12/03/22 0430 12/03/22 0520 12/03/22  0815  BP:  108/75 96/60 104/73  Pulse:  77 93 94  Resp:  '15 14 13  '$ Temp: 99 F (37.2 C)     TempSrc:      SpO2:  94% 95% 95%  Weight:      Height:       No intake or output data in the 24 hours  ending 12/03/22 0824 Filed Weights   12/02/22 2300  Weight: 104.3 kg     Data Reviewed:   CBC: Recent Labs  Lab 12/02/22 1530 12/03/22 0607  WBC 9.3 9.0  NEUTROABS 6.6  --   HGB 14.5 14.0  HCT 43.1 41.5  MCV 88.0 89.4  PLT 265 [id number]   Basic Metabolic Panel: Recent Labs  Lab 12/02/22 1530 12/03/22 0607  NA 135 135  K 3.9 4.1  CL 98 100  CO2 25 23  GLUCOSE 155* 118*  BUN 14 13  CREATININE 0.68 0.82  CALCIUM 10.3 9.2   GFR: Estimated Creatinine Clearance: 85.2 mL/min (by C-G formula based on SCr of 0.82 mg/dL). Liver Function Tests: Recent Labs  Lab 12/03/22 0607  AST 25  ALT 23  ALKPHOS 58  BILITOT 2.5*  PROT 7.2  ALBUMIN 3.7   No results for input(s): "LIPASE", "AMYLASE" in the last 168 hours. No results for input(s): "AMMONIA" in the last 168 hours. Coagulation Profile: No results for input(s): "INR", "PROTIME" in the last 168 hours. Cardiac Enzymes: No results for input(s): "CKTOTAL", "CKMB", "CKMBINDEX", "TROPONINI" in the last 168 hours. BNP (last 3 results) No results for input(s): "PROBNP" in the last 8760 hours. HbA1C: No results for input(s): "HGBA1C" in the last 72 hours. CBG: Recent Labs  Lab 12/03/22 0822  GLUCAP 207*   Lipid Profile: No results for input(s): "CHOL", "HDL", "LDLCALC", "TRIG", "CHOLHDL", "LDLDIRECT" in the last 72 hours. Thyroid Function Tests: No results for input(s): "TSH", "T4TOTAL", "FREET4", "T3FREE", "THYROIDAB" in the last 72 hours. Anemia Panel: No results for input(s): "VITAMINB12", "FOLATE", "FERRITIN", "TIBC", "IRON", "RETICCTPCT" in the last 72 hours. Sepsis Labs: Recent Labs  Lab 12/02/22 1815  LATICACIDVEN 1.3    No results found for this or any previous visit (from the past 240 hour(s)).       Radiology Studies: CT Soft Tissue Neck W Contrast  Result Date: 12/02/2022 CLINICAL DATA:  Concern for region has a EXAM: CT NECK WITH CONTRAST TECHNIQUE: Multidetector CT imaging of the neck was  performed using the standard protocol following the bolus administration of intravenous contrast. RADIATION DOSE REDUCTION: This exam was performed according to the departmental dose-optimization program which includes automated exposure control, adjustment of the mA and/or kV according to patient size and/or use of iterative reconstruction technique. CONTRAST:  14m OMNIPAQUE IOHEXOL 300 MG/ML  SOLN COMPARISON:  None Available. FINDINGS: Pharynx and larynx: There is a periapical along the left mandibular premolars. Immediately abutting these periapical lucencies is an elongated soft tissue abscess measuring approximately 0.7 x 0.7 x 4 cm extending along the left mandibular body (series 2, image 53). There is subcutaneous soft tissue stranding and skin thickening overlying this region. The region of soft tissue stranding extends inferiorly into the submandibular space (series 4, image 72). There is also asymmetric enhancement and thickening in the sublingual space on the left (series 4, image 82). Assessment of the tongue is limited due to streak artifact from dental amalgam, but there is likely at least mild lingual edema, which is best seen in the base of the tongue (series 5, image 57) with  associated narrowing of the oropharyngeal airway (seires 6, image 41). Salivary glands: No inflammation, mass, or stone. Thyroid: Normal. Lymph nodes: None enlarged or abnormal density. Vascular: Negative. Limited intracranial: Negative. Visualized orbits: Orbits are unremarkable. Mastoids and visualized paranasal sinuses: Right maxillary sinus mucosal thickening. Skeleton: No acute or aggressive process. Upper chest: Left-sided cardiac device in place. Other: None IMPRESSION: Elongated odontogenic soft tissue abscess extending along the left mandibular body measuring approximately 0.7 x 0.7 x 4 cm. Inflammatory changes extends into the left submandibular space as well as the left sublingual space.There is also edema involving  the tongue, particularly at the base of the tongue with associated narrowing of the oropharyngeal airway. Findings are concerning for deep soft tissue infection involving the floor of the mouth (Ludwig's angina) with narrowing of the airway. Electronically Signed   By: Marin Roberts M.D.   On: 12/02/2022 17:41        Scheduled Meds:  acetaminophen  500 mg Oral TID AC & HS   atorvastatin  40 mg Oral QPM   carvedilol  25 mg Oral BID WC   enoxaparin (LOVENOX) injection  40 mg Subcutaneous Q24H   gabapentin  600 mg Oral TID   insulin aspart  0-15 Units Subcutaneous TID WC   levothyroxine  75 mcg Oral QAC breakfast   sacubitril-valsartan  1 tablet Oral BID   spironolactone  25 mg Oral QPM   Continuous Infusions:  ampicillin-sulbactam (UNASYN) IV Stopped (12/03/22 0406)     LOS: 0 days   Time spent= 35 mins    Ayodele Sangalang Arsenio Loader, MD [facility]  If 7PM-7AM, please contact night-coverage  12/03/2022, 8:24 AM

## 2022-12-03 NOTE — Progress Notes (Signed)
Remote ICD transmission.   

## 2022-12-03 NOTE — ED Notes (Signed)
ED TO INPATIENT HANDOFF REPORT  ED Nurse Name and Phone #: Luetta Nutting [phone]  S Name/Age/Gender Jordan Daugherty 62 y.o. female Room/Bed: 040C/040C  Code Status   Code Status: Full Code  Home/SNF/Other Home Patient oriented to: self, place, time, and situation Is this baseline? Yes   Triage Complete: Triage complete  Chief Complaint Ludwig's angina [K12.2]  Triage Note Thursday tooth ache,18 /19(left),Friday worse,painful, went to dentist on Saturday, xray showed abscess, started on amox, and flagyl,oxycontin for pain. This am had fever 100 and left side of jaw/lower face swollen and is feeling like its hard to swallow.    Allergies Allergies  Allergen Reactions   Pregabalin     MAKES HER MEAN   Ramipril Other (See Comments)    cough    Level of Care/Admitting Diagnosis ED Disposition     ED Disposition  Admit   Condition  --   [place]: [place] [100100]  Level of Care: Progressive [102]  Admit to Progressive based on following criteria: RESPIRATORY PROBLEMS hypoxemic/hypercapnic respiratory failure that is responsive to NIPPV (BiPAP) or High Flow Nasal Cannula (6-80 lpm). Frequent assessment/intervention, no > Q2 hrs < Q4 hrs, to maintain oxygenation and pulmonary hygiene.  May admit patient to [facility] or [facility] if equivalent level of care is available:: Yes  Covid Evaluation: Asymptomatic - no recent exposure (last 10 days) testing not required  Diagnosis: Ludwig's angina [id number]  Admitting Physician: Lyndee Hensen [id number]  Attending Physician: Lyndee Hensen [id number]  Certification:: I certify this patient will need inpatient services for at least 2 midnights  Estimated Length of Stay: 2          B Medical/Surgery History Past Medical History:  Diagnosis Date   Breast cancer ([place]) 2007   left   Chronic systolic dysfunction of left ventricle    Dyslipidemia    Family history of ovarian cancer    Family  history of prostate cancer    Family history of rectal cancer    Family history of uterine cancer    Hypothyroidism    Morbid obesity with BMI of 40.0-44.9, adult ([place])    Nonischemic cardiomyopathy ([place])    OSA on CPAP    Personal history of radiation therapy 2007   left   Type II diabetes mellitus ([place])    Past Surgical History:  Procedure Laterality Date   BREAST BIOPSY Left 2007   BREAST LUMPECTOMY Left 2007   ICD IMPLANT  11/06/2016   [employer] Dynagen BiV ICD implanted for primary prevention   OOPHORECTOMY Right 05/2005   dermoid     A IV Location/Drains/Wounds Patient Lines/Drains/Airways Status     Active Line/Drains/Airways     Name Placement date Placement time Site Days   Peripheral IV 12/02/22 20 G 1" Right Antecubital 12/02/22  1526  Antecubital  1            Intake/Output Last 24 hours No intake or output data in the 24 hours ending 12/03/22 1445  Labs/Imaging Results for orders placed or performed during the hospital encounter of 12/02/22 (from the past 48 hour(s))  CBC with Differential     Status: Abnormal   Collection Time: 12/02/22  3:30 PM  Result Value Ref Range   WBC 9.3 4.0 - 10.5 K/uL   RBC 4.90 3.87 - 5.11 MIL/uL   Hemoglobin 14.5 12.0 - 15.0 g/dL   HCT 43.1 36.0 - 46.0 %   MCV 88.0 80.0 - 100.0 fL  MCH 29.6 26.0 - 34.0 pg   MCHC 33.6 30.0 - 36.0 g/dL   RDW 13.4 11.5 - 15.5 %   Platelets 265 150 - 400 K/uL   nRBC 0.0 0.0 - 0.2 %   Neutrophils Relative % 72 %   Neutro Abs 6.6 1.7 - 7.7 K/uL   Lymphocytes Relative 16 %   Lymphs Abs 1.5 0.7 - 4.0 K/uL   Monocytes Relative 12 %   Monocytes Absolute 1.2 (H) 0.1 - 1.0 K/uL   Eosinophils Relative 0 %   Eosinophils Absolute 0.0 0.0 - 0.5 K/uL   Basophils Relative 0 %   Basophils Absolute 0.0 0.0 - 0.1 K/uL   Immature Granulocytes 0 %   Abs Immature Granulocytes 0.03 0.00 - 0.07 K/uL    Comment: Performed at [facility], [address] [place],  [place] [id number]  Basic metabolic panel     Status: Abnormal   Collection Time: 12/02/22  3:30 PM  Result Value Ref Range   Sodium 135 135 - 145 mmol/L   Potassium 3.9 3.5 - 5.1 mmol/L   Chloride 98 98 - 111 mmol/L   CO2 25 22 - 32 mmol/L   Glucose, Bld 155 (H) 70 - 99 mg/dL    Comment: Glucose reference range applies only to samples taken after fasting for at least 8 hours.   BUN 14 8 - 23 mg/dL   Creatinine, Ser 0.68 0.44 - 1.00 mg/dL   Calcium 10.3 8.9 - 10.3 mg/dL   GFR, Estimated >60 >60 mL/min    Comment: (NOTE) Calculated using the CKD-EPI Creatinine Equation (2021)    Anion gap 12 5 - 15    Comment: Performed at [facility], [address], [place], [place] [address]  Lactic acid, plasma     Status: None   Collection Time: 12/02/22  6:15 PM  Result Value Ref Range   Lactic Acid, Venous 1.3 0.5 - 1.9 mmol/L    Comment: Performed at [facility], [place], [place] [address]  HIV Antibody (routine testing w rflx)     Status: None   Collection Time: 12/03/22  6:07 AM  Result Value Ref Range   HIV Screen 4th Generation wRfx Non Reactive Non Reactive    Comment: Performed at [facility], [place] [address], [place], [place] [address]  Comprehensive metabolic panel     Status: Abnormal   Collection Time: 12/03/22  6:07 AM  Result Value Ref Range   Sodium 135 135 - 145 mmol/L   Potassium 4.1 3.5 - 5.1 mmol/L   Chloride 100 98 - 111 mmol/L   CO2 23 22 - 32 mmol/L   Glucose, Bld 118 (H) 70 - 99 mg/dL    Comment: Glucose reference range applies only to samples taken after fasting for at least 8 hours.   BUN 13 8 - 23 mg/dL   Creatinine, Ser 0.82 0.44 - 1.00 mg/dL   Calcium 9.2 8.9 - 10.3 mg/dL   Total Protein 7.2 6.5 - 8.1 g/dL   Albumin 3.7 3.5 - 5.0 g/dL   AST 25 15 - 41 U/L   ALT 23 0 - 44 U/L   Alkaline Phosphatase 58 38 - 126 U/L   Total Bilirubin 2.5 (H) 0.3 - 1.2 mg/dL   GFR, Estimated >60 >60 mL/min    Comment:  (NOTE) Calculated using the CKD-EPI Creatinine Equation (2021)    Anion gap 12 5 - 15    Comment: Performed at  [facility], [place] [address], [place] [address]  CBC     Status: None   Collection Time: 12/03/22  6:07 AM  Result Value Ref Range   WBC 9.0 4.0 - 10.5 K/uL   RBC 4.64 3.87 - 5.11 MIL/uL   Hemoglobin 14.0 12.0 - 15.0 g/dL   HCT 41.5 36.0 - 46.0 %   MCV 89.4 80.0 - 100.0 fL   MCH 30.2 26.0 - 34.0 pg   MCHC 33.7 30.0 - 36.0 g/dL   RDW 13.5 11.5 - 15.5 %   Platelets 248 150 - 400 K/uL   nRBC 0.0 0.0 - 0.2 %    Comment: Performed at [facility], [place] [address], [place], [place] [address]  CBG monitoring, ED     Status: Abnormal   Collection Time: 12/03/22  8:22 AM  Result Value Ref Range   Glucose-Capillary 207 (H) 70 - 99 mg/dL    Comment: Glucose reference range applies only to samples taken after fasting for at least 8 hours.   CT Soft Tissue Neck W Contrast  Result Date: 12/02/2022 CLINICAL DATA:  Concern for region has a EXAM: CT NECK WITH CONTRAST TECHNIQUE: Multidetector CT imaging of the neck was performed using the standard protocol following the bolus administration of intravenous contrast. RADIATION DOSE REDUCTION: This exam was performed according to the departmental dose-optimization program which includes automated exposure control, adjustment of the mA and/or kV according to patient size and/or use of iterative reconstruction technique. CONTRAST:  28m OMNIPAQUE IOHEXOL 300 MG/ML  SOLN COMPARISON:  None Available. FINDINGS: Pharynx and larynx: There is a periapical along the left mandibular premolars. Immediately abutting these periapical lucencies is an elongated soft tissue abscess measuring approximately 0.7 x 0.7 x 4 cm extending along the left mandibular body (series 2, image 53). There is subcutaneous soft tissue stranding and skin thickening overlying this region. The region of soft tissue stranding extends inferiorly into the submandibular  space (series 4, image 72). There is also asymmetric enhancement and thickening in the sublingual space on the left (series 4, image 82). Assessment of the tongue is limited due to streak artifact from dental amalgam, but there is likely at least mild lingual edema, which is best seen in the base of the tongue (series 5, image 57) with associated narrowing of the oropharyngeal airway (seires 6, image 41). Salivary glands: No inflammation, mass, or stone. Thyroid: Normal. Lymph nodes: None enlarged or abnormal density. Vascular: Negative. Limited intracranial: Negative. Visualized orbits: Orbits are unremarkable. Mastoids and visualized paranasal sinuses: Right maxillary sinus mucosal thickening. Skeleton: No acute or aggressive process. Upper chest: Left-sided cardiac device in place. Other: None IMPRESSION: Elongated odontogenic soft tissue abscess extending along the left mandibular body measuring approximately 0.7 x 0.7 x 4 cm. Inflammatory changes extends into the left submandibular space as well as the left sublingual space.There is also edema involving the tongue, particularly at the base of the tongue with associated narrowing of the oropharyngeal airway. Findings are concerning for deep soft tissue infection involving the floor of the mouth (Ludwig's angina) with narrowing of the airway. Electronically Signed   By: HMarin RobertsM.D.   On: 12/02/2022 17:41    Pending Labs Unresulted Labs (From admission, onward)     Start     Ordered   12/04/22 [id number] Basic metabolic panel  Daily,   R      12/03/22 0828   12/04/22 0500  CBC  Daily,   R  12/03/22 0828   12/04/22 0500  Magnesium  Daily,   R      12/03/22 0828   12/03/22 0500  Hemoglobin A1c  Tomorrow morning,   R       Comments: To assess prior glycemic control    12/03/22 0212            Vitals/Pain Today's Vitals   12/03/22 1130 12/03/22 1200 12/03/22 1234 12/03/22 1315  BP: (!) 74/43 90/65  (!) 95/53  Pulse: 78 82  75  Resp:  '20 20  14  '$ Temp:      TempSrc:      SpO2: 90% 92%  96%  Weight:      Height:      PainSc:   5      Isolation Precautions No active isolations  Medications Medications  acetaminophen (TYLENOL) tablet 500 mg (500 mg Oral Given 12/03/22 1238)  atorvastatin (LIPITOR) tablet 40 mg (has no administration in time range)  sacubitril-valsartan (ENTRESTO) 49-51 mg per tablet (1 tablet Oral Given 12/03/22 0943)  levothyroxine (SYNTHROID) tablet 75 mcg (75 mcg Oral Given 12/03/22 0818)  gabapentin (NEURONTIN) capsule 600 mg (600 mg Oral Given 12/03/22 0943)  insulin aspart (novoLOG) injection 0-15 Units ( Subcutaneous Not Given 12/03/22 1326)  enoxaparin (LOVENOX) injection 40 mg (has no administration in time range)  polyethylene glycol (MIRALAX / GLYCOLAX) packet 17 g (has no administration in time range)  ondansetron (ZOFRAN) tablet 4 mg (has no administration in time range)    Or  ondansetron (ZOFRAN) injection 4 mg (has no administration in time range)  Ampicillin-Sulbactam (UNASYN) 3 g in sodium chloride 0.9 % 100 mL IVPB (0 g Intravenous Stopped 12/03/22 1316)  ipratropium-albuterol (DUONEB) 0.5-2.5 (3) MG/3ML nebulizer solution 3 mL (has no administration in time range)  hydrALAZINE (APRESOLINE) injection 10 mg (has no administration in time range)  metoprolol tartrate (LOPRESSOR) injection 5 mg (has no administration in time range)  oxyCODONE (Oxy IR/ROXICODONE) immediate release tablet 5 mg (has no administration in time range)  traZODone (DESYREL) tablet 50 mg (has no administration in time range)  senna-docusate (Senokot-S) tablet 1 tablet (has no administration in time range)  guaiFENesin (ROBITUSSIN) 100 MG/5ML liquid 5 mL (has no administration in time range)  acetaminophen (TYLENOL) tablet 650 mg (has no administration in time range)  0.9 %  sodium chloride infusion ( Intravenous New Bag/Given 12/03/22 1322)  midodrine (PROAMATINE) tablet 5 mg (5 mg Oral Given 12/03/22 1239)  fentaNYL  (SUBLIMAZE) injection 12.5 mcg (12.5 mcg Intravenous Given 12/02/22 1528)  Ampicillin-Sulbactam (UNASYN) 3 g in sodium chloride 0.9 % 100 mL IVPB (0 g Intravenous Stopped 12/02/22 1800)  iohexol (OMNIPAQUE) 300 MG/ML solution 100 mL (75 mLs Intravenous Contrast Given 12/02/22 1644)  oxyCODONE-acetaminophen (PERCOCET/ROXICET) 5-325 MG per tablet 1 tablet (1 tablet Oral Given 12/02/22 1801)  sodium chloride 0.9 % bolus 500 mL (0 mLs Intravenous Stopped 12/03/22 1233)    Mobility walks     Focused Assessments    R Recommendations: See Admitting Provider Note  Report given to:   Additional Notes:

## 2022-12-03 NOTE — ED Notes (Signed)
Dr. Amin at bedside.

## 2022-12-03 NOTE — H&P (Addendum)
History and Physical    Patient: Jordan Daugherty [id number] DOB: [birth year] DOA: 12/02/2022 DOS: the patient was seen and examined on 12/03/2022 PCP: Wenda Low, MD  Patient coming from: Home  Chief Complaint:  Chief Complaint  Patient presents with   Oral Swelling   HPI: Jordan Daugherty is a 62 y.o. 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  Husband states she went to the dentist on Saturday and was given antibiotics (amoxicillin and Flagyl) which she has been taking.  Today she woke up with a fever 100 F, left jaw pain swelling and redness.  Felt it was hard to swallow and had difficulty opening her mouth.  She was advised by her primary care provider to go to the ED for evaluation.  In the ED, upon arrival she was tachycardic, heart rate [id number], soft diastolic blood pressure in the 50s, episodes of 85, subsequently started on home oxygen 2 L via nasal cannula.  Imaging concerning for low-grade angina, the ED provider discussed the case with Dr. Janace Hoard, ENT who recommended transfer to a tertiary care center.  Unfortunately there are no beds available at [facility] or [place].  ED spoke with Dr. Posey Pronto (ENT) at at [facility] who reviewed her CT scan given the character location of the abscess recommended a trial of IV antibiotics.  Since the ED provider was unsuccessful in getting a ED to ED transfer she was transferred to [facility] for evaluation by our hospitalist team.  ED workup: CT soft tissue neck showed deep soft tissue infection involving the floor of the mouth with airway narrowing.  ED providers after operation spoke with Dr. Posey Pronto who reviewed the CT and stated  likely not a surgical intervention and will trial IV antibiotics.  Patient given Unasyn in the ED.  CBC normal except for mild left shift and monocytes, BMP showing hyperglycemia (glucose 155) and lactic acid was 1.3.  Patient and her husband wavered on hospital admission here versus signing out AMA then traveling to the [facility] to be evaluated.    Review of Systems: As mentioned in the history of present illness. All other systems reviewed and are negative.  Past Medical History:  Diagnosis Date   Breast cancer ([place]) 2007   left   Chronic systolic dysfunction of left ventricle    Dyslipidemia    Family history of ovarian cancer    Family history of prostate cancer    Family history of rectal cancer    Family history of uterine cancer    Hypothyroidism    Morbid obesity with BMI of 40.0-44.9, adult ([place])    Nonischemic cardiomyopathy ([place])    OSA on CPAP    Personal history of radiation therapy 2007   left   Type II diabetes mellitus ([place])    Past Surgical History:  Procedure Laterality Date   BREAST BIOPSY Left 2007   BREAST LUMPECTOMY Left 2007   ICD IMPLANT  11/06/2016   [employer] Dynagen BiV ICD implanted for primary prevention   OOPHORECTOMY Right 05/2005   dermoid   Social History:  reports that she has never smoked. She has never used smokeless tobacco. She reports that she does not currently use alcohol. She reports that she does not use drugs.  Allergies  Allergen Reactions   Pregabalin     MAKES HER MEAN   Ramipril Other (See Comments)    cough    Family History  Problem Relation Age of Onset   Prostate cancer Father 24   Dementia Father    Colon cancer Father    Skin cancer Brother 58   Uterine cancer Paternal Grandmother 41   Diabetes Paternal Grandfather    Heart disease Paternal Grandfather    Ovarian cancer Other 35       MGMs sister   Rectal cancer Other        MGMs mother    Prior to Admission medications   Medication Sig Start  Date End Date Taking? Authorizing Provider  acetaminophen (TYLENOL) 500 MG tablet Take 500 mg by mouth every 6 (six) hours as needed for moderate pain.   Yes [provider]  amoxicillin (AMOXIL) 875 MG tablet Take 875 mg by mouth 2 (two) times daily. 12/01/22  Yes [provider]  aspirin EC 81 MG tablet Take 81 mg by mouth daily.   Yes [provider]  atorvastatin (LIPITOR) 40 MG tablet Take 40 mg by mouth every evening.   Yes [provider]  Calcium Acetate, Phos Binder, (CALCIUM ACETATE PO) Take 400 mg by mouth daily.   Yes [provider]  carvedilol (COREG) 25 MG tablet Take 25 mg by mouth 2 (two) times daily with a meal.   Yes [provider]  furosemide (LASIX) 40 MG tablet TAKE 1 TABLET BY MOUTH DAILY AS NEEDED Patient taking differently: Take 40 mg by mouth daily as needed for fluid or edema. 08/13/22  Yes Buford Dresser, MD  gabapentin (NEURONTIN) 300 MG capsule Take 600 mg by mouth 3 (three) times daily.   Yes [provider]  JARDIANCE 25 MG TABS tablet Take 25 mg by mouth daily. 02/24/20  Yes [provider]  levothyroxine (SYNTHROID) 75 MCG tablet Take 75 mcg by mouth daily before breakfast.   Yes [provider]  metFORMIN (GLUCOPHAGE) 1000 MG tablet Take 1,000 mg by mouth 2 (two) times daily with a meal.   Yes [provider]  metroNIDAZOLE (FLAGYL) 250 MG tablet Take 250 mg by mouth 3 (three) times daily. 12/01/22  Yes [provider]  Multiple Vitamin (MULTIVITAMIN) tablet Take 1 tablet by mouth daily.   Yes [provider]  oxyCODONE-acetaminophen (PERCOCET/ROXICET) 5-325 MG tablet Take 1 tablet by mouth 4 (four) times daily as needed for severe pain or moderate pain. 12/01/22  Yes [provider]  potassium chloride SA (KLOR-CON M20) 20 MEQ tablet TAKE 1 TABLET (20 MEQ TOTAL) BY MOUTH DAILY AS NEEDED (WHEN YOU TAKE LASIX). 07/23/22  Yes Buford Dresser,  MD  sacubitril-valsartan (ENTRESTO) 49-51 MG Take 1 tablet by mouth 2 (two) times daily.   Yes [provider]  spironolactone (ALDACTONE) 25 MG tablet Take 25 mg by mouth every evening.   Yes [provider]  glucose blood (FREESTYLE LITE) test strip 1 each by Other route as needed for other. Use as instructed    [provider]    Physical Exam: Vitals:   12/03/22 0000 12/03/22 0115 12/03/22 0130 12/03/22 0215  BP: 123/85 91/65 116/74 111/69  Pulse: 89 90 98 87  Resp: 18 19 (!) 27 16  Temp:      TempSrc:      SpO2: 93% 94% 97% 92%  Weight:      Height:  GEN:     alert, speaking in full sentences without pause, in no distress   HENT:  mucus membranes moist, oropharyngeal without lesions or erythema,  nares patent, no nasal discharge, enlarged tongue (Mallampati class IV) left-sided lower, edema, external left jaw erythema and edema that is tender to palpation EYES:   pupils equal and reactive, EOM intact NECK:  supple, normal ROM RESP:  clear to auscultation bilaterally, no increased work of breathing  CVS:   regular rate and rhythm ABD:  soft, non-tender; bowel sounds present EXT:   normal ROM, atraumatic, no edema  NEURO:  normal without focal findings,  speech normal, alert and oriented   Skin:   warm and dry, no rash, normal skin turgor Psych: Normal affect, appropriate speech and behavior   Data Reviewed: Lactic acid 1.3 BMP: Sodium 135, potassium 3 point, bicarb 25, glucose 155, BUN 14, creatinine 0.68, anion gap 12 CBC: WBC 9.3, hemoglobin 14.5, platelets 265, incr. monocyte shift    Assessment and Plan: Principal Problem:   Ludwig's angina Active Problems:   ICD (implantable cardioverter-defibrillator) in place   Chronic combined systolic and diastolic heart failure (HCC)   Type 2 diabetes mellitus with hyperglycemia, without long-term current use of insulin (HCC)   Hypothyroidism, unspecified   Neuropathy due to chemotherapeutic  drug (HCC)   OSA on CPAP    Ludwig Angina  CT soft tissue neck with evidence of ludwig angina and airway narrowing.  Dr. Janace Hoard, ENT was consulted by ED provider who preferred a tertiary care transfer however [facility] and [facility] did not have any beds.  As she is protecting her airway here and is well-appearing ENT at [facility] recommended trial of IV fluids and is likely not a surgical candidate.  She uses 2 to 3 L of oxygen at night.  The external swelling is not increasing (per husband).  Advised to reassess need for transfer if patient acutely declines. CCM to see patient.  -Admit to progressive.   -Follow up critical care recommendations  -Full liquid diet  -Unasyn 3 g IV every 6 hours   Combined diastolic and systolic congestive heart failure Echo from October 2021 showed LVEF 40 to 45% with grade 1 diastolic dysfunction, regional wall motion abnormality and global hypokinesis.  -Continue home Entresto, carvedilol and spironolactone, Lipitor  Hyperlipidemia Chronic.   -Continue home Lipitor  Hypothyroidism Chronic.  I do not see a recent TSH on chart review.   - Continue home levothyroxine  OSA - CPAP nightly -Continue home supplemental oxygen 2-3L nightly  Neuropathy - Continue home gabapentin   Advance Care Planning:   Code Status: Full Code   Consults: ENT  Family Communication: Husband at bedside   Severity of Illness: The appropriate patient status for this patient is INPATIENT. Inpatient status is judged to be reasonable and necessary in order to provide the required intensity of service to ensure the patient's safety. The patient's presenting symptoms, physical exam findings, and initial radiographic and laboratory data in the context of their chronic comorbidities is felt to place them at high risk for further clinical deterioration. Furthermore, it is not anticipated that the patient will be medically stable for discharge from the hospital within 2 midnights of  admission.   * I certify that at the point of admission it is my clinical judgment that the patient will require inpatient hospital care spanning beyond 2 midnights from the point of admission due to high intensity of service, high risk for further deterioration and  high frequency of surveillance required.*  Author: Lyndee Hensen, DO 12/03/2022 1:35 AM  For on call review [email or web address].

## 2022-12-03 NOTE — ED Notes (Signed)
Pt ambulatory to and from restroom with steady gait 

## 2022-12-03 NOTE — Consult Note (Signed)
NAME:  Jordan Daugherty, MRN:  [id number], DOB:  05-Feb-1961, LOS: 0 ADMISSION DATE:  12/02/2022, CONSULTATION DATE:  12/02/22 REFERRING MD:  ED , CHIEF COMPLAINT:  Tooth pain and swelling jaw   History of Present Illness:  62 yo woman with hx of NICM due to anthracycline toxicity, DM2, ICD here with ludwigs angina.   Started having a toothache Thursday (4 days ago), worsened since then.   At dentist on Saturday x ray shoed abscess, started on amoxicillin, flagy, oxygontin.   Fever developed today.  Increasing L side of jaw/lower face, painful swallowing.   [facility] did not accept patient.   Pertinent  Medical History  HLD, Hypothyroidism,  DM2 OSA on CPAP  NICM Obesily  L Breast cancer  Significant Hospital Events: Including procedures, antibiotic start and stop dates in addition to other pertinent events     Interim History / Subjective:    Objective   Blood pressure 91/65, pulse 90, temperature 99.8 F (37.7 C), temperature source Oral, resp. rate 19, height '5\' 5"'$  (1.651 m), weight 104.3 kg, SpO2 94 %.       No intake or output data in the 24 hours ending 12/03/22 0151 Filed Weights   12/02/22 2300  Weight: 104.3 kg    Examination: General: NAD, pleasant HENT: Swelling along L mandible, no oral lesions, sublingual area appears mildly swollen but symmetric.   No wheeze/stridor. Swallowing without difficulty, speaking clearly, breathing without difficulty  Lungs: ctab  Cardiovascular: rrr  Abdomen: nd,  Extremities: no edema    Resolved Hospital Problem list     Assessment & Plan:  Ludwigs angina:  Agree with admit to progressive, close monitoring. Continuous O2 sat and heart monitor.   Recieved percocet and now patient is more comfortable.   Cont unasyn.   ENT to see in AM.  No need for intubation now.  Monitor closely.    Will call anesthesia if intubation needed.    Best Practice (right click and "Reselect all SmartList Selections" daily)   Labs    CBC: Recent Labs  Lab 12/02/22 1530  WBC 9.3  NEUTROABS 6.6  HGB 14.5  HCT 43.1  MCV 88.0  PLT [id number]    Basic Metabolic Panel: Recent Labs  Lab 12/02/22 1530  NA 135  K 3.9  CL 98  CO2 25  GLUCOSE 155*  BUN 14  CREATININE 0.68  CALCIUM 10.3   GFR: Estimated Creatinine Clearance: 87.4 mL/min (by C-G formula based on SCr of 0.68 mg/dL). Recent Labs  Lab 12/02/22 1530 12/02/22 1815  WBC 9.3  --   LATICACIDVEN  --  1.3    Liver Function Tests: No results for input(s): "AST", "ALT", "ALKPHOS", "BILITOT", "PROT", "ALBUMIN" in the last 168 hours. No results for input(s): "LIPASE", "AMYLASE" in the last 168 hours. No results for input(s): "AMMONIA" in the last 168 hours.  ABG No results found for: "PHART", "PCO2ART", "PO2ART", "HCO3", "TCO2", "ACIDBASEDEF", "O2SAT"   Coagulation Profile: No results for input(s): "INR", "PROTIME" in the last 168 hours.  Cardiac Enzymes: No results for input(s): "CKTOTAL", "CKMB", "CKMBINDEX", "TROPONINI" in the last 168 hours.  HbA1C: No results found for: "HGBA1C"  CBG: No results for input(s): "GLUCAP" in the last 168 hours.  Review of Systems:   Review of Systems  Constitutional:  Negative for fever.  HENT:  Negative for hearing loss.   Eyes:  Negative for blurred vision.  Respiratory:  Negative for cough.   Cardiovascular:  Negative for chest pain.  Gastrointestinal:  Negative for heartburn.  Genitourinary:  Negative for dysuria.  Musculoskeletal:  Negative for myalgias.  Skin:  Negative for rash.  Neurological:  Negative for dizziness.  Endo/Heme/Allergies:  Does not bruise/bleed easily.  Psychiatric/Behavioral:  Negative for depression.       Past Medical History:  She,  has a past medical history of Breast cancer ([place]) ([id number]), Chronic systolic dysfunction of left ventricle, Dyslipidemia, Family history of ovarian cancer, Family history of prostate cancer, Family history of rectal cancer, Family history of uterine  cancer, Hypothyroidism, Morbid obesity with BMI of 40.0-44.9, adult ([place]), Nonischemic cardiomyopathy ([place]), OSA on CPAP, Personal history of radiation therapy (2007), and Type II diabetes mellitus ([place]).   Surgical History:   Past Surgical History:  Procedure Laterality Date   BREAST BIOPSY Left 2007   BREAST LUMPECTOMY Left 2007   ICD IMPLANT  11/06/2016   [employer] Dynagen BiV ICD implanted for primary prevention   OOPHORECTOMY Right 05/2005   dermoid     Social History:   reports that she has never smoked. She has never used smokeless tobacco. She reports that she does not currently use alcohol. She reports that she does not use drugs.   Family History:  Her family history includes Colon cancer in her father; Dementia in her father; Diabetes in her paternal grandfather; Heart disease in her paternal grandfather; Ovarian cancer (age of onset: 53) in an other family member; Prostate cancer (age of onset: 67) in her father; Rectal cancer in an other family member; Skin cancer (age of onset: 80) in her brother; Uterine cancer (age of onset: 4) in her paternal grandmother.   Allergies Allergies  Allergen Reactions   Pregabalin     MAKES HER MEAN   Ramipril Other (See Comments)    cough     Home Medications  Prior to Admission medications   Medication Sig Start Date End Date Taking? Authorizing Provider  acetaminophen (TYLENOL) 500 MG tablet Take 500 mg by mouth every 6 (six) hours as needed for moderate pain.   Yes [provider]  amoxicillin (AMOXIL) 875 MG tablet Take 875 mg by mouth 2 (two) times daily. 12/01/22  Yes [provider]  aspirin EC 81 MG tablet Take 81 mg by mouth daily.   Yes [provider]  atorvastatin (LIPITOR) 40 MG tablet Take 40 mg by mouth every evening.   Yes [provider]  Calcium Acetate, Phos Binder, (CALCIUM ACETATE PO) Take 400 mg by mouth daily.   Yes [provider]  carvedilol (COREG) 25 MG  tablet Take 25 mg by mouth 2 (two) times daily with a meal.   Yes [provider]  furosemide (LASIX) 40 MG tablet TAKE 1 TABLET BY MOUTH DAILY AS NEEDED Patient taking differently: Take 40 mg by mouth daily as needed for fluid or edema. 08/13/22  Yes Buford Dresser, MD  gabapentin (NEURONTIN) 300 MG capsule Take 600 mg by mouth 3 (three) times daily.   Yes [provider]  JARDIANCE 25 MG TABS tablet Take 25 mg by mouth daily. 02/24/20  Yes [provider]  levothyroxine (SYNTHROID) 75 MCG tablet Take 75 mcg by mouth daily before breakfast.   Yes [provider]  metFORMIN (GLUCOPHAGE) 1000 MG tablet Take 1,000 mg by mouth 2 (two) times daily with a meal.   Yes [provider]  metroNIDAZOLE (FLAGYL) 250 MG tablet Take 250 mg by mouth 3 (three) times daily. 12/01/22  Yes [provider]  Multiple Vitamin (MULTIVITAMIN)  tablet Take 1 tablet by mouth daily.   Yes [provider]  oxyCODONE-acetaminophen (PERCOCET/ROXICET) 5-325 MG tablet Take 1 tablet by mouth 4 (four) times daily as needed for severe pain or moderate pain. 12/01/22  Yes [provider]  potassium chloride SA (KLOR-CON M20) 20 MEQ tablet TAKE 1 TABLET (20 MEQ TOTAL) BY MOUTH DAILY AS NEEDED (WHEN YOU TAKE LASIX). 07/23/22  Yes Buford Dresser, MD  sacubitril-valsartan (ENTRESTO) 49-51 MG Take 1 tablet by mouth 2 (two) times daily.   Yes [provider]  spironolactone (ALDACTONE) 25 MG tablet Take 25 mg by mouth every evening.   Yes [provider]  glucose blood (FREESTYLE LITE) test strip 1 each by Other route as needed for other. Use as instructed    [provider]     Critical care time: 35 min

## 2022-12-03 NOTE — Consult Note (Signed)
Reason for Consult:Jaw swelling Referring Physician: Gerlean Ren, MD  [name] is an 62 y.o. female.  CC: Pain and swelling lower left jaw.    HPI: Saw dentist 2 days ago and was placed on Amoxicillin and Flagyl. Presented to ER yesterday and has been receiving IV antibiotics. Feels swelling has gone down substantially and now having minimal pain.  Past Medical History:  Diagnosis Date   Breast cancer ([place]) 2007   left   Chronic systolic dysfunction of left ventricle    Dyslipidemia    Family history of ovarian cancer    Family history of prostate cancer    Family history of rectal cancer    Family history of uterine cancer    Hypothyroidism    Morbid obesity with BMI of 40.0-44.9, adult ([place])    Nonischemic cardiomyopathy ([place])    OSA on CPAP    Personal history of radiation therapy 2007   left   Type II diabetes mellitus ([place])     Past Surgical History:  Procedure Laterality Date   BREAST BIOPSY Left 2007   BREAST LUMPECTOMY Left 2007   ICD IMPLANT  11/06/2016   [employer] Dynagen BiV ICD implanted for primary prevention   OOPHORECTOMY Right 05/2005   dermoid    Family History  Problem Relation Age of Onset   Prostate cancer Father 33   Dementia Father    Colon cancer Father    Skin cancer Brother 80   Uterine cancer Paternal Grandmother 74   Diabetes Paternal Grandfather    Heart disease Paternal Grandfather    Ovarian cancer Other 73       MGMs sister   Rectal cancer Other        MGMs mother    Social History:  reports that she has never smoked. She has never used smokeless tobacco. She reports that she does not currently use alcohol. She reports that she does not use drugs.  Allergies:  Allergies  Allergen Reactions   Pregabalin     MAKES HER MEAN   Ramipril Other (See Comments)    cough    Medications: I have reviewed the patient's current medications. Prior to Admission: (Not in a hospital admission)  Scheduled:  acetaminophen  500  mg Oral TID AC & HS   atorvastatin  40 mg Oral QPM   enoxaparin (LOVENOX) injection  40 mg Subcutaneous Q24H   gabapentin  600 mg Oral TID   insulin aspart  0-15 Units Subcutaneous TID WC   levothyroxine  75 mcg Oral QAC breakfast   midodrine  5 mg Oral TID WC   sacubitril-valsartan  1 tablet Oral BID    Results for orders placed or performed during the hospital encounter of 12/02/22 (from the past 48 hour(s))  CBC with Differential     Status: Abnormal   Collection Time: 12/02/22  3:30 PM  Result Value Ref Range   WBC 9.3 4.0 - 10.5 K/uL   RBC 4.90 3.87 - 5.11 MIL/uL   Hemoglobin 14.5 12.0 - 15.0 g/dL   HCT 43.1 36.0 - 46.0 %   MCV 88.0 80.0 - 100.0 fL   MCH 29.6 26.0 - 34.0 pg   MCHC 33.6 30.0 - 36.0 g/dL   RDW 13.4 11.5 - 15.5 %   Platelets 265 150 - 400 K/uL   nRBC 0.0 0.0 - 0.2 %   Neutrophils Relative % 72 %   Neutro Abs 6.6 1.7 - 7.7 K/uL   Lymphocytes Relative 16 %  Lymphs Abs 1.5 0.7 - 4.0 K/uL   Monocytes Relative 12 %   Monocytes Absolute 1.2 (H) 0.1 - 1.0 K/uL   Eosinophils Relative 0 %   Eosinophils Absolute 0.0 0.0 - 0.5 K/uL   Basophils Relative 0 %   Basophils Absolute 0.0 0.0 - 0.1 K/uL   Immature Granulocytes 0 %   Abs Immature Granulocytes 0.03 0.00 - 0.07 K/uL    Comment: Performed at [facility], [address], [place], [place] [id number]  Basic metabolic panel     Status: Abnormal   Collection Time: 12/02/22  3:30 PM  Result Value Ref Range   Sodium 135 135 - 145 mmol/L   Potassium 3.9 3.5 - 5.1 mmol/L   Chloride 98 98 - 111 mmol/L   CO2 25 22 - 32 mmol/L   Glucose, Bld 155 (H) 70 - 99 mg/dL    Comment: Glucose reference range applies only to samples taken after fasting for at least 8 hours.   BUN 14 8 - 23 mg/dL   Creatinine, Ser 0.68 0.44 - 1.00 mg/dL   Calcium 10.3 8.9 - 10.3 mg/dL   GFR, Estimated >60 >60 mL/min    Comment: (NOTE) Calculated using the CKD-EPI Creatinine Equation (2021)    Anion gap 12 5 - 15     Comment: Performed at [facility], [address] [place], [place] [address]  Lactic acid, plasma     Status: None   Collection Time: 12/02/22  6:15 PM  Result Value Ref Range   Lactic Acid, Venous 1.3 0.5 - 1.9 mmol/L    Comment: Performed at [facility], [place], [place] [address]  HIV Antibody (routine testing w rflx)     Status: None   Collection Time: 12/03/22  6:07 AM  Result Value Ref Range   HIV Screen 4th Generation wRfx Non Reactive Non Reactive    Comment: Performed at [facility], [place] [address], [place], [place] [address]  Comprehensive metabolic panel     Status: Abnormal   Collection Time: 12/03/22  6:07 AM  Result Value Ref Range   Sodium 135 135 - 145 mmol/L   Potassium 4.1 3.5 - 5.1 mmol/L   Chloride 100 98 - 111 mmol/L   CO2 23 22 - 32 mmol/L   Glucose, Bld 118 (H) 70 - 99 mg/dL    Comment: Glucose reference range applies only to samples taken after fasting for at least 8 hours.   BUN 13 8 - 23 mg/dL   Creatinine, Ser 0.82 0.44 - 1.00 mg/dL   Calcium 9.2 8.9 - 10.3 mg/dL   Total Protein 7.2 6.5 - 8.1 g/dL   Albumin 3.7 3.5 - 5.0 g/dL   AST 25 15 - 41 U/L   ALT 23 0 - 44 U/L   Alkaline Phosphatase 58 38 - 126 U/L   Total Bilirubin 2.5 (H) 0.3 - 1.2 mg/dL   GFR, Estimated >60 >60 mL/min    Comment: (NOTE) Calculated using the CKD-EPI Creatinine Equation (2021)    Anion gap 12 5 - 15    Comment: Performed at [facility] [address] [place] [address]  CBC     Status: None   Collection Time: 12/03/22  6:07 AM  Result Value Ref Range   WBC 9.0 4.0 - 10.5 K/uL   RBC 4.64 3.87 - 5.11 MIL/uL   Hemoglobin 14.0 12.0 - 15.0 g/dL   HCT 41.5 36.0 - 46.0 %  MCV 89.4 80.0 - 100.0 fL   MCH 30.2 26.0 - 34.0 pg   MCHC 33.7 30.0 - 36.0 g/dL   RDW 13.5 11.5 - 15.5 %   Platelets 248 150 - 400 K/uL   nRBC 0.0 0.0 - 0.2 %    Comment: Performed at [facility], [place] [address],  [place], [place] [address]  CBG monitoring, ED     Status: Abnormal   Collection Time: 12/03/22  8:22 AM  Result Value Ref Range   Glucose-Capillary 207 (H) 70 - 99 mg/dL    Comment: Glucose reference range applies only to samples taken after fasting for at least 8 hours.    CT Soft Tissue Neck W Contrast  Result Date: 12/02/2022 CLINICAL DATA:  Concern for region has a EXAM: CT NECK WITH CONTRAST TECHNIQUE: Multidetector CT imaging of the neck was performed using the standard protocol following the bolus administration of intravenous contrast. RADIATION DOSE REDUCTION: This exam was performed according to the departmental dose-optimization program which includes automated exposure control, adjustment of the mA and/or kV according to patient size and/or use of iterative reconstruction technique. CONTRAST:  48m OMNIPAQUE IOHEXOL 300 MG/ML  SOLN COMPARISON:  None Available. FINDINGS: Pharynx and larynx: There is a periapical along the left mandibular premolars. Immediately abutting these periapical lucencies is an elongated soft tissue abscess measuring approximately 0.7 x 0.7 x 4 cm extending along the left mandibular body (series 2, image 53). There is subcutaneous soft tissue stranding and skin thickening overlying this region. The region of soft tissue stranding extends inferiorly into the submandibular space (series 4, image 72). There is also asymmetric enhancement and thickening in the sublingual space on the left (series 4, image 82). Assessment of the tongue is limited due to streak artifact from dental amalgam, but there is likely at least mild lingual edema, which is best seen in the base of the tongue (series 5, image 57) with associated narrowing of the oropharyngeal airway (seires 6, image 41). Salivary glands: No inflammation, mass, or stone. Thyroid: Normal. Lymph nodes: None enlarged or abnormal density. Vascular: Negative. Limited intracranial: Negative. Visualized orbits: Orbits are  unremarkable. Mastoids and visualized paranasal sinuses: Right maxillary sinus mucosal thickening. Skeleton: No acute or aggressive process. Upper chest: Left-sided cardiac device in place. Other: None IMPRESSION: Elongated odontogenic soft tissue abscess extending along the left mandibular body measuring approximately 0.7 x 0.7 x 4 cm. Inflammatory changes extends into the left submandibular space as well as the left sublingual space.There is also edema involving the tongue, particularly at the base of the tongue with associated narrowing of the oropharyngeal airway. Findings are concerning for deep soft tissue infection involving the floor of the mouth (Ludwig's angina) with narrowing of the airway. Electronically Signed   By: HMarin RobertsM.D.   On: 12/02/2022 17:41    ROS Blood pressure (!) 95/53, pulse 75, temperature 99 F (37.2 C), resp. rate 14, height '5\' 5"'$  (1.651 m), weight 104.3 kg, SpO2 96 %. General appearance: alert, cooperative, no distress, and morbidly obese Head: Normocephalic, without obvious abnormality, atraumatic Nose: Nares normal. Septum midline. Mucosa normal. No drainage or sinus tenderness. Throat: Mild gingival edema buccal to tooth # 19. Non-mobile. No buccal fluctuance or edema. No lingual edema. Bilateral lingual tori. No trismus- opens to approximately 30 mm. Pharynx clear, no drainage, no uvula displacement.  Neck: no adenopathy, supple, symmetrical, trachea midline, and [place] left submandibular  Periapical radiograph (supplied by patient) demonstrates periapical lucency of tooth #19  with  possible tooth fracture, and small lucency @ apex of tooth #18.   Assessment/Plan: Discussed extraction of tooth #19, possibly #18, with intra- or extra-oral incision and drainage. Patient was hoping to save the tooth by having a root canal. In view of patient's decreasing swelling and improvement since IV antibiotics were started, saving the tooth is possible if patient continues to  improve on antibiotics. Will follow.    Diona Browner 12/03/2022, 2:03 PM

## 2022-12-03 NOTE — Progress Notes (Signed)
Assisted pt with home CPAP set up at bedside.  2L of 02 bleed in.

## 2022-12-04 LAB — CBC
HCT: 39.9 % (ref 36.0–46.0)
Hemoglobin: 13.1 g/dL (ref 12.0–15.0)
MCH: 29.7 pg (ref 26.0–34.0)
MCHC: 32.8 g/dL (ref 30.0–36.0)
MCV: 90.5 fL (ref 80.0–100.0)
Platelets: 253 10*3/uL (ref 150–400)
RBC: 4.41 MIL/uL (ref 3.87–5.11)
RDW: 13.4 % (ref 11.5–15.5)
WBC: 8 10*3/uL (ref 4.0–10.5)
nRBC: 0 % (ref 0.0–0.2)

## 2022-12-04 LAB — BASIC METABOLIC PANEL
Anion gap: 11 (ref 5–15)
BUN: 14 mg/dL (ref 8–23)
CO2: 22 mmol/L (ref 22–32)
Calcium: 8.7 mg/dL — ABNORMAL LOW (ref 8.9–10.3)
Chloride: 102 mmol/L (ref 98–111)
Creatinine, Ser: 0.74 mg/dL (ref 0.44–1.00)
GFR, Estimated: >60 mL/min (ref >60)
Glucose, Bld: 107 mg/dL — ABNORMAL HIGH (ref 70–99)
Potassium: 3.8 mmol/L (ref 3.5–5.1)
Sodium: 135 mmol/L (ref 135–145)

## 2022-12-04 LAB — HEMOGLOBIN A1C
Hgb A1c MFr Bld: 7.7 % — ABNORMAL HIGH (ref 4.8–5.6)
Mean Plasma Glucose: 174 mg/dL

## 2022-12-04 LAB — GLUCOSE, CAPILLARY
Glucose-Capillary: 102 mg/dL — ABNORMAL HIGH (ref 70–99)
Glucose-Capillary: 183 mg/dL — ABNORMAL HIGH (ref 70–99)
Glucose-Capillary: 125 mg/dL — ABNORMAL HIGH (ref 70–99)

## 2022-12-04 LAB — MAGNESIUM: Magnesium: 1.9 mg/dL (ref 1.7–2.4)

## 2022-12-04 MED ORDER — SALINE SPRAY 0.65 % NA SOLN: 1.0000 | NASAL | Status: DC | PRN

## 2022-12-04 NOTE — Progress Notes (Signed)
Assisted patient with getting her home CPAP unit on for the night. Unit working correctly and plugged into red outlet. 2 lpm bled into her machine.

## 2022-12-04 NOTE — Progress Notes (Signed)
PROGRESS NOTE    Jordan Daugherty  [id number] DOB: 1961-09-04 DOA: 12/02/2022 PCP: Wenda Low, MD   Brief Narrative:   Per H&P: 62 y.o. 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  Husband states she went to the dentist on Saturday and was given antibiotics (amoxicillin and Flagyl) which she has been taking.  Today she woke up with a fever 100 F, left jaw pain swelling and redness.  Felt it was hard to swallow and had difficulty opening her mouth.  She was advised by her primary care provider to go to the ED for evaluation.  Upon admission EDP consulted ENT who initially recommended transferring patient to tertiary care but after reviewing the scan they recommended trial of IV antibiotics and patient was admitted to the hospital.  Patient was also seen by pulmonary team.  Oral surgery evaluated and ultimately, patient was started on IV Abx and staying at [facility] for treatment.   2/27- She has been afebrile for 36 hours with only 2 doses of tylenol in that time. Today, she states she is feeling worse but overall improved since presentation.   Assessment & Plan:  Principal Problem:   Ludwig's angina Active Problems:   ICD (implantable cardioverter-defibrillator) in place   Chronic combined systolic and diastolic heart failure ([place])   Type 2 diabetes mellitus with hyperglycemia, without long-term current use of insulin ([facility])   Hypothyroidism, unspecified   Neuropathy due to chemotherapeutic drug ([facility])   OSA on CPAP   Ludwig Angina - continues to have clear airway and no difficulty with swallowing. Overall improvement in swelling and pain. Afebrile.  - oral surgery  following, appreciate your care - continue IV unasyn - monitor    Combined diastolic and systolic congestive heart failure Echo from October 2021 showed LVEF 40 to 45% with grade 1 diastolic dysfunction, regional wall motion abnormality and global hypokinesis.  - home Entresto, carvedilol and spironolactone -> will hold due to soft BP  Hypotension- home antihypertensives are being held and systolics in [id number], diastolics [id number]. She is asymptomatic - PO hydration - continue midodrine    Hyperlipidemia - continue home Lipitor   Hypothyroidism - continue Synthroid  Non-insulin dependent Type 2 DM- holding home medications - sSSI   OSA - Bedtime CPAP   Neuropathy - Continue home gabapentin  DVT prophylaxis: Lovenox discontinued per patient preference. She states she will increase daily ambulation and OOB time instead Code Status: Full code Family Communication:  Spouse at bedside  Status is: Inpatient Remains inpatient appropriate because: Cont hospi stay for IV Abx.   Subjective: Pt reports feeling worse than yesterday. Admits to being better overall since presentation of swelling and pain. Denies difficulty with swallowing and breathing.  Patient and spouse have significant amount of concerns and questions addressed all at time of encounter.   Examination: General exam: Appears calm and comfortable. Left sided facial swelling improved. no erythema.  HEENT: on left lower jaw, tenderness to gumline, swelling. Mild erythema in mouth, none externally.  Respiratory system: Clear to auscultation. Respiratory effort normal. Cardiovascular system: warm extremities Central nervous system: Alert and oriented. No focal neurological deficits. Extremities: Symmetric 5 x 5 power. Skin: No rashes, lesions or ulcers Psychiatry: Judgement and insight appear normal. Mood & affect appropriate.  Objective: Vitals:   12/03/22 2020 12/03/22 2045 12/04/22 0000 12/04/22 0405  BP: (!) 98/58  103/72 (!) 100/59 101/63  Pulse: 70 78 69 79  Resp: 15 (!) '23 10 14  '$ Temp:    98.3 F (36.8 C)  TempSrc:    Oral  SpO2: 94% 92% 96% 92%  Weight:      Height:        Intake/Output Summary (Last 24 hours) at 12/04/2022 0802 Last data filed at 12/03/2022 2100 Gross per 24 hour  Intake 777.55 ml  Output --  Net 777.55 ml   Filed Weights   12/02/22 2300  Weight: 104.3 kg   Data Reviewed:   CBC: Recent Labs  Lab 12/02/22 1530 12/03/22 0607 12/04/22 0352  WBC 9.3 9.0 8.0  NEUTROABS 6.6  --   --   HGB 14.5 14.0 13.1  HCT 43.1 41.5 39.9  MCV 88.0 89.4 90.5  PLT 265 248 [id number]    Basic Metabolic Panel: Recent Labs  Lab 12/02/22 1530 12/03/22 0607 12/04/22 0352  NA 135 135 135  K 3.9 4.1 3.8  CL 98 100 102  CO2 '25 23 22  '$ GLUCOSE 155* 118* 107*  BUN '14 13 14  '$ CREATININE 0.68 0.82 0.74  CALCIUM 10.3 9.2 8.7*  MG  --   --  1.9    GFR: Estimated Creatinine Clearance: 87.4 mL/min (by C-G formula based on SCr of 0.74 mg/dL). Liver Function Tests: Recent Labs  Lab 12/03/22 0607  AST 25  ALT 23  ALKPHOS 58  BILITOT 2.5*  PROT 7.2  ALBUMIN 3.7    HbA1C: Recent Labs    12/03/22 0607  HGBA1C 7.7*   CBG: Recent Labs  Lab 12/03/22 0822 12/03/22 1526 12/03/22 1634 12/03/22 2024  GLUCAP 207* 180* 129* 125*     Sepsis Labs: Recent Labs  Lab 12/02/22 1815  LATICACIDVEN 1.3     Radiology Studies: CT Soft Tissue Neck W Contrast  Result Date: 12/02/2022 CLINICAL DATA:  Concern for region has a EXAM: CT NECK WITH CONTRAST TECHNIQUE: Multidetector CT imaging of the neck was performed using the standard protocol following the bolus administration of intravenous contrast. RADIATION DOSE REDUCTION: This exam was performed according to the departmental dose-optimization program which includes automated exposure control, adjustment of the mA and/or kV according to patient size and/or use of iterative reconstruction technique. CONTRAST:  66m OMNIPAQUE IOHEXOL 300  MG/ML  SOLN COMPARISON:  None Available. FINDINGS: Pharynx and larynx: There is a periapical along the left mandibular premolars. Immediately abutting these periapical lucencies is an elongated soft tissue abscess measuring approximately 0.7 x 0.7 x 4 cm extending along the left mandibular body (series 2, image 53). There is subcutaneous soft tissue stranding and skin thickening overlying this region. The region of soft tissue stranding extends inferiorly into the submandibular space (series 4, image 72). There is also asymmetric enhancement and thickening in the sublingual space on the left (series 4, image 82). Assessment of the tongue is limited due to streak artifact from dental amalgam, but there is likely at least mild lingual edema, which is best seen in the base of the tongue (series 5, image 57) with associated narrowing of the oropharyngeal airway (seires 6, image 41). Salivary glands: No inflammation, mass, or stone. Thyroid: Normal. Lymph nodes: None enlarged or abnormal density. Vascular: Negative. Limited intracranial: Negative. Visualized orbits: Orbits are unremarkable. Mastoids and visualized paranasal sinuses: Right maxillary sinus mucosal thickening. Skeleton: No acute or aggressive process. Upper chest: Left-sided cardiac  device in place. Other: None IMPRESSION: Elongated odontogenic soft tissue abscess extending along the left mandibular body measuring approximately 0.7 x 0.7 x 4 cm. Inflammatory changes extends into the left submandibular space as well as the left sublingual space.There is also edema involving the tongue, particularly at the base of the tongue with associated narrowing of the oropharyngeal airway. Findings are concerning for deep soft tissue infection involving the floor of the mouth (Ludwig's angina) with narrowing of the airway. Electronically Signed   By: Marin Roberts M.D.   On: 12/02/2022 17:41     Scheduled Meds:  acetaminophen  500 mg Oral TID AC & HS   atorvastatin   40 mg Oral QPM   enoxaparin (LOVENOX) injection  40 mg Subcutaneous Q24H   gabapentin  600 mg Oral TID   insulin aspart  0-15 Units Subcutaneous TID WC   levothyroxine  75 mcg Oral QAC breakfast   midodrine  5 mg Oral TID WC   sacubitril-valsartan  1 tablet Oral BID   Continuous Infusions:  sodium chloride 75 mL/hr at 12/04/22 0614   ampicillin-sulbactam (UNASYN) IV 3 g (12/04/22 0614)     LOS: 1 day   Time spent= 35 mins    Richarda Osmond, MD [facility]  If 7PM-7AM, please contact night-coverage  12/04/2022, 8:02 AM

## 2022-12-04 NOTE — Progress Notes (Signed)
Jordan Daugherty PROGRESS NOTE:   SUBJECTIVE: Still some pain, swelling feels better.  OBJECTIVE:  Vitals: Blood pressure 97/63, pulse 70, temperature 99.1 F (37.3 C), temperature source Oral, resp. rate 11, height '5\' 5"'$  (1.651 m), weight 104.3 kg, SpO2 95 %. Lab results: Results for orders placed or performed during the hospital encounter of 12/02/22 (from the past 24 hour(s))  CBG monitoring, ED     Status: Abnormal   Collection Time: 12/03/22  3:26 PM  Result Value Ref Range   Glucose-Capillary 180 (H) 70 - 99 mg/dL  Glucose, capillary     Status: Abnormal   Collection Time: 12/03/22  4:34 PM  Result Value Ref Range   Glucose-Capillary 129 (H) 70 - 99 mg/dL  Glucose, capillary     Status: Abnormal   Collection Time: 12/03/22  8:24 PM  Result Value Ref Range   Glucose-Capillary 125 (H) 70 - 99 mg/dL  Basic metabolic panel     Status: Abnormal   Collection Time: 12/04/22  3:52 AM  Result Value Ref Range   Sodium 135 135 - 145 mmol/L   Potassium 3.8 3.5 - 5.1 mmol/L   Chloride 102 98 - 111 mmol/L   CO2 22 22 - 32 mmol/L   Glucose, Bld 107 (H) 70 - 99 mg/dL   BUN 14 8 - 23 mg/dL   Creatinine, Ser 0.74 0.44 - 1.00 mg/dL   Calcium 8.7 (L) 8.9 - 10.3 mg/dL   GFR, Estimated >60 >60 mL/min   Anion gap 11 5 - 15  CBC     Status: None   Collection Time: 12/04/22  3:52 AM  Result Value Ref Range   WBC 8.0 4.0 - 10.5 K/uL   RBC 4.41 3.87 - 5.11 MIL/uL   Hemoglobin 13.1 12.0 - 15.0 g/dL   HCT 39.9 36.0 - 46.0 %   MCV 90.5 80.0 - 100.0 fL   MCH 29.7 26.0 - 34.0 pg   MCHC 32.8 30.0 - 36.0 g/dL   RDW 13.4 11.5 - 15.5 %   Platelets 253 150 - 400 K/uL   nRBC 0.0 0.0 - 0.2 %  Magnesium     Status: None   Collection Time: 12/04/22  3:52 AM  Result Value Ref Range   Magnesium 1.9 1.7 - 2.4 mg/dL  Glucose, capillary     Status: Abnormal   Collection Time: 12/04/22  8:21 AM  Result Value Ref Range   Glucose-Capillary 102 (H) 70 - 99 mg/dL  Glucose, capillary     Status: Abnormal    Collection Time: 12/04/22 11:29 AM  Result Value Ref Range   Glucose-Capillary 183 (H) 70 - 99 mg/dL   Radiology Results: CT Soft Tissue Neck W Contrast  Result Date: 12/02/2022 CLINICAL DATA:  Concern for region has a EXAM: CT NECK WITH CONTRAST TECHNIQUE: Multidetector CT imaging of the neck was performed using the standard protocol following the bolus administration of intravenous contrast. RADIATION DOSE REDUCTION: This exam was performed according to the departmental dose-optimization program which includes automated exposure control, adjustment of the mA and/or kV according to patient size and/or use of iterative reconstruction technique. CONTRAST:  48m OMNIPAQUE IOHEXOL 300 MG/ML  SOLN COMPARISON:  None Available. FINDINGS: Pharynx and larynx: There is a periapical along the left mandibular premolars. Immediately abutting these periapical lucencies is an elongated soft tissue abscess measuring approximately 0.7 x 0.7 x 4 cm extending along the left mandibular body (series 2, image 53). There is subcutaneous soft tissue stranding and skin  thickening overlying this region. The region of soft tissue stranding extends inferiorly into the submandibular space (series 4, image 72). There is also asymmetric enhancement and thickening in the sublingual space on the left (series 4, image 82). Assessment of the tongue is limited due to streak artifact from dental amalgam, but there is likely at least mild lingual edema, which is best seen in the base of the tongue (series 5, image 57) with associated narrowing of the oropharyngeal airway (seires 6, image 41). Salivary glands: No inflammation, mass, or stone. Thyroid: Normal. Lymph nodes: None enlarged or abnormal density. Vascular: Negative. Limited intracranial: Negative. Visualized orbits: Orbits are unremarkable. Mastoids and visualized paranasal sinuses: Right maxillary sinus mucosal thickening. Skeleton: No acute or aggressive process. Upper chest:  Left-sided cardiac device in place. Other: None IMPRESSION: Elongated odontogenic soft tissue abscess extending along the left mandibular body measuring approximately 0.7 x 0.7 x 4 cm. Inflammatory changes extends into the left submandibular space as well as the left sublingual space.There is also edema involving the tongue, particularly at the base of the tongue with associated narrowing of the oropharyngeal airway. Findings are concerning for deep soft tissue infection involving the floor of the mouth (Ludwig's angina) with narrowing of the airway. Electronically Signed   By: Marin Roberts M.D.   On: 12/02/2022 17:41   General appearance: alert, cooperative, and no distress Head: Normocephalic, without obvious abnormality, atraumatic Eyes: negative Nose: Nares normal. Septum midline. Mucosa normal. No drainage or sinus tenderness. Throat: No trismus. Pharynx clear. Gingival edema buccal to tooth #19. No mobility #19. No lingual edema. Neck: Left mandibular edema present, tender to touch. No erythema. No submental /sublingual edema.   ASSESSMENT:Showing clinical improvement on IV antibiotics. WBC slightly down.Discussed incision and drainage of infection, extraction of tooth vs root canal to save tooth. Patient clearly non-emergent at this point. Patient wants to save tooth if possible.  PLAN: Continue IV antibiotics. Re-evaluate tomorrow.    Diona Browner 12/04/2022

## 2022-12-04 NOTE — Care Management (Signed)
  Transition of Care (TOC) Screening Note   Patient Details  Name: Jordan Daugherty Date of Birth: 11/12/1960   Transition of Care [facility]) CM/SW Contact:    Carles Collet, RN Phone Number: 12/04/2022, 8:30 AM    Transition of Care Department [facility]) has reviewed patient and no TOC needs have been identified at this time. We will continue to monitor patient advancement through interdisciplinary progression rounds. If new patient transition needs arise, please place a TOC consult.  Admitted from home w spouse, [name] Angina tx w Iv Abx, ENT following, may transfer to another hospital. Acute to acute transfers are facilitated by attending and staff nursing, TOC available for discharge coordination.

## 2022-12-05 LAB — BASIC METABOLIC PANEL
Anion gap: 10 (ref 5–15)
BUN: 10 mg/dL (ref 8–23)
CO2: 22 mmol/L (ref 22–32)
Calcium: 8.7 mg/dL — ABNORMAL LOW (ref 8.9–10.3)
Chloride: 107 mmol/L (ref 98–111)
Creatinine, Ser: 0.74 mg/dL (ref 0.44–1.00)
GFR, Estimated: >60 mL/min (ref >60)
Glucose, Bld: 105 mg/dL — ABNORMAL HIGH (ref 70–99)
Potassium: 3.6 mmol/L (ref 3.5–5.1)
Sodium: 139 mmol/L (ref 135–145)

## 2022-12-05 LAB — CBC
HCT: 36.9 % (ref 36.0–46.0)
Hemoglobin: 12.2 g/dL (ref 12.0–15.0)
MCH: 30.1 pg (ref 26.0–34.0)
MCHC: 33.1 g/dL (ref 30.0–36.0)
MCV: 91.1 fL (ref 80.0–100.0)
Platelets: 231 10*3/uL (ref 150–400)
RBC: 4.05 MIL/uL (ref 3.87–5.11)
RDW: 13.3 % (ref 11.5–15.5)
WBC: 6.2 10*3/uL (ref 4.0–10.5)
nRBC: 0 % (ref 0.0–0.2)

## 2022-12-05 LAB — GLUCOSE, CAPILLARY
Glucose-Capillary: 105 mg/dL — ABNORMAL HIGH (ref 70–99)
Glucose-Capillary: 142 mg/dL — ABNORMAL HIGH (ref 70–99)

## 2022-12-05 LAB — MAGNESIUM: Magnesium: 1.9 mg/dL (ref 1.7–2.4)

## 2022-12-05 MED ORDER — AMOXICILLIN-POT CLAVULANATE 875-125 MG PO TABS: 1.0000 | ORAL_TABLET | Freq: Two times a day (BID) | ORAL | 0 refills | Status: AC

## 2022-12-05 MED ORDER — AMOXICILLIN-POT CLAVULANATE 875-125 MG PO TABS: 1.0000 | ORAL_TABLET | Freq: Two times a day (BID) | ORAL | 0 refills | Status: DC

## 2022-12-05 NOTE — Discharge Instructions (Addendum)
[place] had an infection around your jaw, consistent with Ludwig's angina.  He was started on antibiotics which have improved your symptoms significantly.  The oral surgeon evaluated you and recommended no surgical management at this time.  Recommendation for you to discharge on continued antibiotics and to follow-up with your dentist for root canal versus tooth extraction.  The oral surgeon also wanted you to follow-up with his office in 1 week as needed.  While you are here, you had some low normal blood pressure readings.  I have recommended to continue your Entresto but to hold your spironolactone and Coreg.  Recommend checking your blood pressure every day and contact your cardiologist office to let them know about your medication changes.  Please weigh self daily as well to ensure you do not rapidly gain weight from fluid accumulation while off spironolactone.

## 2022-12-05 NOTE — Discharge Summary (Signed)
Physician Discharge Summary   Patient: Jordan Daugherty MRN: [id number] DOB: 01/10/1961  Admit date:     12/02/2022  Discharge date: 12/05/2022  Discharge Physician: Cordelia Poche, MD   PCP: Wenda Low, MD   Recommendations at discharge:  Follow-up with Dentist for consideration of root canal and/or tooth extraction  Discharge Diagnoses: Principal Problem:   Ludwig's angina Active Problems:   ICD (implantable cardioverter-defibrillator) in place   Chronic combined systolic and diastolic heart failure ([place])   Type 2 diabetes mellitus with hyperglycemia, without long-term current use of insulin (H[facility])   Hypothyroidism, unspecified   Neuropathy due to chemotherapeutic drug ([place])   OSA on CPAP  Resolved Problems:   * No resolved hospital problems. *  Hospital Course: Jordan Daugherty is a 62 y.o. female with a history of chronic heart failure, nonischemic cardiomyopathy, OSA, diabetes mellitus type 2, hypothyroidism, hyperlipidemia, neuropathy, breast cancer. Patient presented secondary to left tooth pain and was found to have evidence of Ludwig's angina. IV antibiotics started. CT imaging with evidence of abscess. Oral surgery consulted and recommended conservative management without I&D. Patient transitioned to oral antibiotics and recommended for outpatient follow-up with Dentistry.  Assessment and Plan:  Ludwig's angina Patient presented with left tooth pain and found to have abscess formation with concern for Ludwig's angina. IV antibiotics started. Oral surgery consulted and recommended no I&D/surgical management. Patient improved with antibiotics and discharged home with oral antibiotics. Patient to follow-up with outpatient dentistry.  Primary hypertension Blood pressure low-normal during admission, requiring holding home Coreg and spironolactone. Patient continued on Entresto. On discharge, blood pressure remained low-normal, so recommendation to hold Coreg and spironolactone on  discharge and follow-up with outpatient cardiologist.  Diabetes mellitus type 2 Continue Jardiance and metformin.  Chronic combined systolic and diastolic heart failure Patient follows with cardiology as an outpatient.  Hypothyroidism Continue Synthroid  Neuropathy Continue gabapentin  OSA Continue CPAP   Consultants: Oral surgery Procedures performed: None  Disposition: Home Diet recommendation: Cardiac and Carb modified diet  DISCHARGE MEDICATION: Allergies as of 12/05/2022       Reactions   Pregabalin    MAKES HER MEAN   Ramipril Other (See Comments)   cough        Medication List     STOP taking these medications    amoxicillin 875 MG tablet Commonly known as: AMOXIL   carvedilol 25 MG tablet Commonly known as: COREG   metroNIDAZOLE 250 MG tablet Commonly known as: FLAGYL   spironolactone 25 MG tablet Commonly known as: ALDACTONE       TAKE these medications    acetaminophen 500 MG tablet Commonly known as: TYLENOL Take 500 mg by mouth every 6 (six) hours as needed for moderate pain.   amoxicillin-clavulanate 875-125 MG tablet Commonly known as: AUGMENTIN Take 1 tablet by mouth 2 (two) times daily for 7 days.   aspirin EC 81 MG tablet Take 81 mg by mouth daily.   atorvastatin 40 MG tablet Commonly known as: LIPITOR Take 40 mg by mouth every evening.   CALCIUM ACETATE PO Take 400 mg by mouth daily.   Entresto 49-51 MG Generic drug: sacubitril-valsartan Take 1 tablet by mouth 2 (two) times daily.   FREESTYLE LITE test strip Generic drug: glucose blood 1 each by Other route as needed for other. Use as instructed   furosemide 40 MG tablet Commonly known as: LASIX TAKE 1 TABLET BY MOUTH DAILY AS NEEDED What changed: reasons to take this   gabapentin 300 MG  capsule Commonly known as: NEURONTIN Take 600 mg by mouth 3 (three) times daily.   Jardiance 25 MG Tabs tablet Generic drug: empagliflozin Take 25 mg by mouth daily.    levothyroxine 75 MCG tablet Commonly known as: SYNTHROID Take 75 mcg by mouth daily before breakfast.   metFORMIN 1000 MG tablet Commonly known as: GLUCOPHAGE Take 1,000 mg by mouth 2 (two) times daily with a meal.   multivitamin tablet Take 1 tablet by mouth daily.   oxyCODONE-acetaminophen 5-325 MG tablet Commonly known as: PERCOCET/ROXICET Take 1 tablet by mouth 4 (four) times daily as needed for severe pain or moderate pain.   potassium chloride SA 20 MEQ tablet Commonly known as: Klor-Con M20 TAKE 1 TABLET (20 MEQ TOTAL) BY MOUTH DAILY AS NEEDED (WHEN YOU TAKE LASIX).        Follow-up Information     Wenda Low, MD. Schedule an appointment as soon as possible for a visit in 1 week(s).   Specialty: Internal Medicine Why: For hospital follow-up Contact information: [address] [place] [address] [phone]         Diona Browner, DMD Follow up in 1 week(s).   Specialty: Oral Surgery Why: As needed Contact information: [address]  [place] [address] [phone]                Discharge Exam: BP 103/62 (BP Location: Right Arm)   Pulse 64   Temp 98.6 F (37 C) (Oral)   Resp 15   Ht '5\' 5"'$  (1.651 m)   Wt 104.3 kg   SpO2 100%   BMI 38.27 kg/m   General exam: Appears calm and comfortable HEENT: Tender mass located over left mandible Respiratory system: Clear to auscultation. Respiratory effort normal. Cardiovascular system: S1 & S2 heard, RRR. No murmurs, rubs, gallops or clicks. Gastrointestinal system: Abdomen is nondistended, soft and nontender. Normal bowel sounds heard. Central nervous system: Alert and oriented. No focal neurological deficits. Musculoskeletal: No edema. No calf tenderness Skin: No cyanosis. No rashes Psychiatry: Judgement and insight appear normal. Mood & affect appropriate.   Condition at discharge: stable  The results of significant diagnostics from this hospitalization (including  imaging, microbiology, ancillary and laboratory) are listed below for reference.   Imaging Studies: CT Soft Tissue Neck W Contrast  Result Date: 12/02/2022 CLINICAL DATA:  Concern for region has a EXAM: CT NECK WITH CONTRAST TECHNIQUE: Multidetector CT imaging of the neck was performed using the standard protocol following the bolus administration of intravenous contrast. RADIATION DOSE REDUCTION: This exam was performed according to the departmental dose-optimization program which includes automated exposure control, adjustment of the mA and/or kV according to patient size and/or use of iterative reconstruction technique. CONTRAST:  73m OMNIPAQUE IOHEXOL 300 MG/ML  SOLN COMPARISON:  None Available. FINDINGS: Pharynx and larynx: There is a periapical along the left mandibular premolars. Immediately abutting these periapical lucencies is an elongated soft tissue abscess measuring approximately 0.7 x 0.7 x 4 cm extending along the left mandibular body (series 2, image 53). There is subcutaneous soft tissue stranding and skin thickening overlying this region. The region of soft tissue stranding extends inferiorly into the submandibular space (series 4, image 72). There is also asymmetric enhancement and thickening in the sublingual space on the left (series 4, image 82). Assessment of the tongue is limited due to streak artifact from dental amalgam, but there is likely at least mild lingual edema, which is best seen in the base of the tongue (series  5, image 57) with associated narrowing of the oropharyngeal airway (seires 6, image 41). Salivary glands: No inflammation, mass, or stone. Thyroid: Normal. Lymph nodes: None enlarged or abnormal density. Vascular: Negative. Limited intracranial: Negative. Visualized orbits: Orbits are unremarkable. Mastoids and visualized paranasal sinuses: Right maxillary sinus mucosal thickening. Skeleton: No acute or aggressive process. Upper chest: Left-sided cardiac device in  place. Other: None IMPRESSION: Elongated odontogenic soft tissue abscess extending along the left mandibular body measuring approximately 0.7 x 0.7 x 4 cm. Inflammatory changes extends into the left submandibular space as well as the left sublingual space.There is also edema involving the tongue, particularly at the base of the tongue with associated narrowing of the oropharyngeal airway. Findings are concerning for deep soft tissue infection involving the floor of the mouth (Ludwig's angina) with narrowing of the airway. Electronically Signed   By: Marin Roberts M.D.   On: 12/02/2022 17:41   US BREAST LTD UNI RIGHT INC AXILLA  Result Date: 11/13/2022 CLINICAL DATA:  Six-month follow-up for a probably benign right breast mass, initially assessed with diagnostic imaging on 05/12/2022. EXAM: ULTRASOUND OF THE RIGHT BREAST COMPARISON:  Prior exams. FINDINGS: Targeted right breast ultrasound is performed, showing an oval hypo to anechoic circumscribed mass in the right breast at 5 o'clock, 3 cm the nipple, measuring 7 x 3 x 6 mm, unchanged from the prior exam. IMPRESSION: 1. Small probably benign mass in the right breast at 5 o'clock stable since the exam performed on 05/12/2022, consistent with a complicated cyst. Additional short-term follow-up recommended. RECOMMENDATION: Diagnostic bilateral mammography and right breast ultrasound in 6 months. I have discussed the findings and recommendations with the patient. If applicable, a reminder letter will be sent to the patient regarding the next appointment. BI-RADS CATEGORY  3: Probably benign. Electronically Signed   By: Lajean Manes M.D.   On: 11/13/2022 11:04   Microbiology: No results found for this or any previous visit.  Labs: CBC: Recent Labs  Lab 12/02/22 1530 12/03/22 0607 12/04/22 0352 12/05/22 0254  WBC 9.3 9.0 8.0 6.2  NEUTROABS 6.6  --   --   --   HGB 14.5 14.0 13.1 12.2  HCT 43.1 41.5 39.9 36.9  MCV 88.0 89.4 90.5 91.1  PLT 265 248 253  [id number]   Basic Metabolic Panel: Recent Labs  Lab 12/02/22 1530 12/03/22 0607 12/04/22 0352 12/05/22 0254  NA 135 135 135 139  K 3.9 4.1 3.8 3.6  CL 98 100 102 107  CO2 '25 23 22 22  '$ GLUCOSE 155* 118* 107* 105*  BUN '14 13 14 10  '$ CREATININE 0.68 0.82 0.74 0.74  CALCIUM 10.3 9.2 8.7* 8.7*  MG  --   --  1.9 1.9   Liver Function Tests: Recent Labs  Lab 12/03/22 0607  AST 25  ALT 23  ALKPHOS 58  BILITOT 2.5*  PROT 7.2  ALBUMIN 3.7   CBG: Recent Labs  Lab 12/04/22 0821 12/04/22 1129 12/04/22 1605 12/05/22 0847 12/05/22 1225  GLUCAP 102* 183* 125* 105* 142*    Discharge time spent: 35 minutes.  Signed: Cordelia Poche, MD [facility] 12/05/2022

## 2022-12-05 NOTE — Progress Notes (Signed)
[facility] PROGRESS NOTE:   SUBJECTIVE: Some pain, swelling decreased.  OBJECTIVE:  Vitals: Blood pressure 103/62, pulse 64, temperature 98.6 F (37 C), temperature source Oral, resp. rate 15, height '5\' 5"'$  (1.651 m), weight 104.3 kg, SpO2 100 %. Lab results: Results for orders placed or performed during the hospital encounter of 12/02/22 (from the past 24 hour(s))  Glucose, capillary     Status: Abnormal   Collection Time: 12/04/22  4:05 PM  Result Value Ref Range   Glucose-Capillary 125 (H) 70 - 99 mg/dL  Basic metabolic panel     Status: Abnormal   Collection Time: 12/05/22  2:54 AM  Result Value Ref Range   Sodium 139 135 - 145 mmol/L   Potassium 3.6 3.5 - 5.1 mmol/L   Chloride 107 98 - 111 mmol/L   CO2 22 22 - 32 mmol/L   Glucose, Bld 105 (H) 70 - 99 mg/dL   BUN 10 8 - 23 mg/dL   Creatinine, Ser 0.74 0.44 - 1.00 mg/dL   Calcium 8.7 (L) 8.9 - 10.3 mg/dL   GFR, Estimated >60 >60 mL/min   Anion gap 10 5 - 15  CBC     Status: None   Collection Time: 12/05/22  2:54 AM  Result Value Ref Range   WBC 6.2 4.0 - 10.5 K/uL   RBC 4.05 3.87 - 5.11 MIL/uL   Hemoglobin 12.2 12.0 - 15.0 g/dL   HCT 36.9 36.0 - 46.0 %   MCV 91.1 80.0 - 100.0 fL   MCH 30.1 26.0 - 34.0 pg   MCHC 33.1 30.0 - 36.0 g/dL   RDW 13.3 11.5 - 15.5 %   Platelets 231 150 - 400 K/uL   nRBC 0.0 0.0 - 0.2 %  Magnesium     Status: None   Collection Time: 12/05/22  2:54 AM  Result Value Ref Range   Magnesium 1.9 1.7 - 2.4 mg/dL  Glucose, capillary     Status: Abnormal   Collection Time: 12/05/22  8:47 AM  Result Value Ref Range   Glucose-Capillary 105 (H) 70 - 99 mg/dL  Glucose, capillary     Status: Abnormal   Collection Time: 12/05/22 12:25 PM  Result Value Ref Range   Glucose-Capillary 142 (H) 70 - 99 mg/dL   Radiology Results: No results found. General appearance: alert, cooperative, and no distress Head: Normocephalic, without obvious abnormality, atraumatic Eyes: negative Nose: Nares normal.  Septum midline. Mucosa normal. No drainage or sinus tenderness. Throat: Mild buccal edema lateral to tooth # 19. No trismus. No lingual edema. Pharynx clear.  Neck: Mild soft edema left submandibular area.  ASSESSMENT: Continues to improve. WBC down to 6.2. Pt spoke to general dentist who can  perform  root canal treatment. If tooth can't be saved or if drainage necessary, it can be performed as outpatient under local anesthesia.   PLAN: OK for D/C on oral antibiotics. Can follow up in my office next week prn. Patient requests meds in case of  yeast infection while on antibiotics.    Diona Browner 12/05/2022

## 2022-12-06 ENCOUNTER — Encounter: Payer: Self-pay | Admitting: Oral Surgery

## 2022-12-07 ENCOUNTER — Telehealth: Payer: Self-pay | Admitting: Cardiology

## 2022-12-07 NOTE — Hospital Course (Signed)
Jordan Daugherty is a 61 y.o. female with a history of chronic heart failure, nonischemic cardiomyopathy, OSA, diabetes mellitus type 2, hypothyroidism, hyperlipidemia, neuropathy, breast cancer. Patient presented secondary to left tooth pain and was found to have evidence of Ludwig's angina. IV antibiotics started. CT imaging with evidence of abscess. Oral surgery consulted and recommended conservative management without I&D. Patient transitioned to oral antibiotics and recommended for outpatient follow-up with Dentistry.

## 2022-12-07 NOTE — Telephone Encounter (Signed)
Returned call to patient and provided the following recommendations, patient verbalizes understanding and will let us know if things change.     "Hospitalized for Ludwig's angina related to infected tooth (non cardiac). Spironolactone, Carvedilol were discontinued due to hypotension. Recommend she check BP once per day and keep a log. Let us know if consistently >130/80. Recommend she let us know if she notes fluid retention (shortness of breath, weight gain of 2 lbs overnight or 5 lbs in one week). No need for cardiology appt at this time.   Loel Dubonnet, NP"

## 2022-12-07 NOTE — Telephone Encounter (Signed)
Hospitalized for Ludwig's angina related to infected tooth (non cardiac). Spironolactone, Carvedilol were discontinued due to hypotension. Recommend she check BP once per day and keep a log. Let us know if consistently >130/80. Recommend she let us know if she notes fluid retention (shortness of breath, weight gain of 2 lbs overnight or 5 lbs in one week). No need for cardiology appt at this time.  Loel Dubonnet, NP

## 2022-12-07 NOTE — Telephone Encounter (Signed)
Pt c/o medication issue:  1. Name of Medication:  carvedilol (COREG) 25 MG tablet  spironolactone (ALDACTONE) 25 MG tablet    2. How are you currently taking this medication (dosage and times per day)?   3. Are you having a reaction (difficulty breathing--STAT)?   4. What is your medication issue? Patient called stating she was recently in the hospital and they took her off of these medication. She wants to make Dr. Harrell Gave aware. She wants to know if Dr. Harrell Gave would like to see her.

## 2022-12-10 NOTE — Telephone Encounter (Signed)
Returned call to patient, no answer, left message ok per DPR, that we would only be concerned for BP consistently >130/80

## 2022-12-10 NOTE — Telephone Encounter (Signed)
Pt c/o medication issue:  1. Name of Medication:   Coreg and Spironolactone  2. How are you currently taking this medication (dosage and times per day)?   Not taking  3. Are you having a reaction (difficulty breathing--STAT)?   N/A  4. What is your medication issue?   Patient stated her BP has inched back up: 100/80 - 3/4 119/79 113/81 109/81 119/75  - 3/1 pm  Patient wants to know if she should start back on these medications.

## 2023-03-07 ENCOUNTER — Telehealth: Payer: Self-pay | Admitting: Adult Health

## 2023-03-07 NOTE — Telephone Encounter (Signed)
Patient has stated they have relocated to a different state and no longer wants care here any more.

## 2023-04-08 ENCOUNTER — Encounter: Payer: 59 | Admitting: Adult Health
